# Patient Record
Sex: Male | Born: 1985 | Race: White | Hispanic: No | Marital: Married | Smoking: Current every day smoker
Health system: Southern US, Community
[De-identification: ages and names within clinical notes are randomized; demographics above are authoritative.]

## PROBLEM LIST (undated history)

## (undated) DIAGNOSIS — J45909 Unspecified asthma, uncomplicated: Secondary | ICD-10-CM

## (undated) DIAGNOSIS — Q922 Partial trisomy: Secondary | ICD-10-CM

## (undated) DIAGNOSIS — Q998 Other specified chromosome abnormalities: Secondary | ICD-10-CM

## (undated) DIAGNOSIS — H539 Unspecified visual disturbance: Secondary | ICD-10-CM

## (undated) HISTORY — PX: MANDIBLE SURGERY: SHX707

## (undated) HISTORY — PX: WISDOM TOOTH EXTRACTION: SHX21

## (undated) HISTORY — DX: Other specified chromosome abnormalities: Q99.8

## (undated) HISTORY — DX: Unspecified visual disturbance: H53.9

## (undated) HISTORY — DX: Partial trisomy: Q92.2

---
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
## 2009-03-17 ENCOUNTER — Encounter (INDEPENDENT_AMBULATORY_CARE_PROVIDER_SITE_OTHER): Payer: Self-pay | Admitting: Internal Medicine

## 2009-03-17 ENCOUNTER — Ambulatory Visit: Payer: Self-pay | Admitting: Family Medicine

## 2009-03-17 LAB — CONVERTED CEMR LAB
ALT: 27 units/L (ref 0–53)
AST: 16 units/L (ref 0–37)
Albumin: 4.2 g/dL (ref 3.5–5.2)
Alkaline Phosphatase: 71 units/L (ref 39–117)
BUN: 14 mg/dL (ref 6–23)
Basophils Absolute: 0.1 10*3/uL (ref 0.0–0.1)
Basophils Relative: 1 % (ref 0–1)
CO2: 23 meq/L (ref 19–32)
Calcium: 8.8 mg/dL (ref 8.4–10.5)
Chloride: 103 meq/L (ref 96–112)
Creatinine, Ser: 0.84 mg/dL (ref 0.40–1.50)
Eosinophils Absolute: 0.3 10*3/uL (ref 0.0–0.7)
Eosinophils Relative: 3 % (ref 0–5)
Glucose, Bld: 75 mg/dL (ref 70–99)
HCT: 47.2 % (ref 39.0–52.0)
Hemoglobin: 15.7 g/dL (ref 13.0–17.0)
Lymphocytes Relative: 29 % (ref 12–46)
Lymphs Abs: 2.9 10*3/uL (ref 0.7–4.0)
MCHC: 33.3 g/dL (ref 30.0–36.0)
MCV: 90.8 fL (ref 78.0–100.0)
Magnesium: 2 mg/dL (ref 1.5–2.5)
Monocytes Absolute: 1.1 10*3/uL — ABNORMAL HIGH (ref 0.1–1.0)
Monocytes Relative: 11 % (ref 3–12)
Neutro Abs: 5.7 10*3/uL (ref 1.7–7.7)
Neutrophils Relative %: 57 % (ref 43–77)
Platelets: 295 10*3/uL (ref 150–400)
Potassium: 4.4 meq/L (ref 3.5–5.3)
RBC: 5.2 M/uL (ref 4.22–5.81)
RDW: 14 % (ref 11.5–15.5)
Sodium: 141 meq/L (ref 135–145)
TSH: 0.797 microintl units/mL (ref 0.350–4.500)
Total Bilirubin: 0.3 mg/dL (ref 0.3–1.2)
Total Protein: 7.1 g/dL (ref 6.0–8.3)
WBC: 10.1 10*3/uL (ref 4.0–10.5)

## 2010-07-01 NOTE — Discharge Summary (Signed)
NAMEGREYDON, BETKE NO.:  [id number]   MEDICAL RECORD NO.:  [id number]          PATIENT TYPE:  OIB   LOCATION:  1613                         FACILITY:  [facility]   PHYSICIAN:  Grant Ruts., D.D.S.DATE OF BIRTH:  11-02-1985   DATE OF ADMISSION:  06/08/2005  DATE OF DISCHARGE:  06/09/2005                                 DISCHARGE SUMMARY   HISTORY OF PRESENT ILLNESS:  This 25 year old male was admitted to [facility] on June 08, 2005, with a primary diagnosis of multiple facial  skeletal abnormalities and malocclusion.  He was admitted for surgical  correction with orthognathic surgery and for a complete review of physical  findings and diagnosis via the admission note.   HOSPITAL COURSE:  On the day of admission, the patient was taken to surgery  and under a general nasotracheal anesthesia, a segmental two-piece LeFort I  maxillary osteotomy with a posterior and superior repositioning was  completed and transverse widening with rigid fixation and a bilateral  sagittal split osteotomy of the mandible with advancement in rotation for  asymmetry and Class II correction and rigid fixation.  The patient tolerated  the surgery and the anesthesia without complications.   Postop day #1, was marked with some nausea and minimal p.o. intake.  This  has improved significantly over the last 8-10 hours and the patient is now  alert, ambulating and is ready for discharge.  He also has a history of  asthma, but has not had any significant airway difficulty and is now taking  fluids well.   Home care instructions were reviewed with the patient and his mother  including emesis and airway obstruction, how to remove the maxillomandibular  fixation, dietary instructions for full liquid diet and oral hygiene  instructions to maintain good oral hygiene.   DISCHARGE MEDICATIONS:  1.  Decadron 4 mg three times daily x3 days.  2.  Keflex 500 mg four times daily x7 days.  3.  Clarinex one tablet daily x15 days.  4.  Percocet one tablet every 4-6h. as needed for pain.  5.  Continuing albuterol inhaler as needed.   DISCHARGE DIAGNOSES:  1.  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  2.  Maxillary transverse deficiency.  3.  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  4.  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  5.  Asthma.           ______________________________  Grant Ruts., D.D.S.    WB/MEDQ  D:  06/09/2005  T:  06/10/2005  Job:  [id number]

## 2010-07-01 NOTE — Op Note (Signed)
NAMESONYA, PUCCI                ACCOUNT NO.:  [id number]   MEDICAL RECORD NO.:  [id number]          PATIENT TYPE:  AMB   LOCATION:  DAY                          FACILITY:  [facility]   PHYSICIAN:  Grant Ruts., D.D.S.DATE OF BIRTH:  1985/12/20   DATE OF PROCEDURE:  06/08/2005  DATE OF DISCHARGE:                                 OPERATIVE REPORT   PREOPERATIVE DIAGNOSIS:  Mandibular posterior vertical maxillary excess with  anterior open bite malocclusion, maxillary transverse deficiency, mandibular  sagittal deficiency with class 2 malocclusion and mandibular asymmetry.   POSTOPERATIVE DIAGNOSIS:  Mandibular posterior vertical maxillary excess  with anterior open bite malocclusion, maxillary transverse deficiency,  mandibular sagittal deficiency with class 2 malocclusion and mandibular  asymmetry.   OPERATION PERFORMED:  Jerry Caras 1 segmental maxillary osteotomy with expansion  and rigid fixation and bilateral sagittal split osteotomy and mandible with  rigid fixation.   SURGEON:  Gwendlyn Deutscher, D.D.S.   ANESTHESIA:   DESCRIPTION OF PROCEDURE:  The patient was brought to the operating room and  placed in supine position on the operating table.  Prior to the surgery,  __________ surgical models on a semi-adjusted articulator were used to  simulate the maxillary osteotomy segments, the alignment of the segments and  the advancement necessary for his mandibular correction.  Surgical guides  were fabricated to use as interocclusal splints during the surgery.   PROCEDURE AND FINDINGS:  The patient was brought to the operating room and  placed in supine position on the operating table.  After satisfactory  nasotracheal anesthesia, the Foley catheter was inserted and an NG tube.   The patient was then scrubbed with Betadine scrub and Betadine paint and one  moist sponge was placed in the pharynx as a throat pack.   Using an electrocautery, an incision was made along the right ramus  of the  mandible and extended to the submucosal buccinator muscle periosteum down to  bone.  Prior to the incision, bilateral infra-alveolar and lingual nerve  blocks were achieved for hemostasis and pain control postoperatively and a  greater palatine, nasal palatine and infraorbital nerve blocks were achieved  in the maxilla.   After the incision, a subperiosteal dissection was carried out on the right  ramus of the mandible and above the area of the lingula and a medial and  lateral dissection was carried out in a subperiosteal manner.  Using a  retractor, the lingula was identified on the medial aspect of the mandible  and using the rotary osteotome and Lindeman bur a horizontal __________ cut  was made just superior to the lingula to the medial cortical plate from the  posterior border to the anterior border.  A second sagittal cut was made  with a 701 bur and a final cut was made from the inferior border superior to  connect with the sagittal cut.  The mouth prop was moved to the right side  and a similar procedure was used to complete the sagittal osteotomy on the  left side.  No attempt was made to split either side at this point.  The  area was packed and attention was carried to the maxilla and using 0.5%  Marcaine with 1:200,000 epinephrine, posterior superior alveolar nerve,  nasopalatine, greater palatine nerves were blocked and incision was made in  the maxillary vestibule with electrocautery approximately 1 cm superior to  the junction of the attached and nonattached gingiva and zygomatic buttress  to the midline.  This incision was carried through the mucosa, submucosal  layers, periosteum down to bone.  A full thickness mucoperiosteal flap was  elevated superiorly and inferiorly exposing the lateral wall of the axilla.  This was completed also on the left side and then using a rotary osteotome,  a mark was made 10 mm width along the wall of the maxilla as a guide to how   much bone to remove and a 703 bur was used to make a horizontal bone cut 1  cm superior to the apices of the mandibular teeth and a second bone cut was  made on the posterior wall of the maxilla to the pterygoid plates and a  third cut was made through the lateral and medial wall of the maxillary  sinus into the maxillary sinus.  The medial wall of the maxilla was  separated using an osteotome chisel back to the pterygoid process.  The  nasal septal chisel was used to remove the nasal septum from the horizontal  portion of the palate and similar bone cuts were made on the other side.  Pterygomaxillary chisels were used to fracture the pterygoid plates  posteriorly.  The maxilla then could be downfractured in the usual manner  for intraoral surgery.  After down fracturing the maxilla, it was  segmentalized between the midlines and on the bilateral portion of the  palate to divide the alveolar process into two segments and the posterior  segment was expanded approximately 5 mm.  This was verified by surgical  splint and the splint was tied into position using a 26 gauge stainless  steel wire.   The maxilla after being segmentalized was placed in the surgical splint  guide.  The surgical splint guide was placed in intermaxillary fixation with  the other teeth.  After placing the patient into maxillary fixation, the  condyles seated in the most superior position in the glenoid fossa and it  was rotated into superior positioning approximately 2 mm in the anterior and  3 mm to 4 mm in the posterior.  The sinuses were thoroughly irrigated with  normal saline the incisions and torn tissue in the nasopharynx was suctioned  and sutured.  The maxilla then could be repositioned superiorly 4 mm in the  posterior and 2 mm in the anterior. Bone plates were adapted to the maxilla  and piriform rim and the zygomatic buttress on both sides and there was good bone contact seen all around.  With the patient  held in this position, the  maxilla was fixated using a L bone plates on both areas.  This nicely held  the maxilla in position.  The intermaxillary fixation was removed.  Occlusion was checked and seemed to be exactly as planned surgically.   Attention was carried to the mandible.  The maxillary splint was changed to  the final splint and ligated to the maxillary teeth.  The mandibular  osteotomies were completed with mallet and chisel, taking great care to  prevent injury to the inferior alveolar nerve and to dissect the inferior  alveolar nerve out and place it in the distal segments on  both sides.  There  was no evidence of damage to the inferior alveolar nerve. With the sagittal  osteotomies completed, the mandible was repositioned in its new position and  locked in its new position and locked into position in intermaxillary  fixation and on both rami, the advancement was seen to be  correct of  approximately 4 to 5 mm on both sides with the greater advancement being on  the left side as opposed to the right side.   Attention was turned carried out towards the proximal segment and the  proximal segment of the condyle was adapted to the ramus of the mandible in  its new position and the KLS bone plating and screw system was used to place  two screws through the left ramus of the mandible and then the two screws  through the right ramus of the mandible.   The KLS screws were placed in a noncompression manner through the medial and  lateral portal of the plate with the mandible held in its new position to  stabilize the segments with rigid fixation.  The intermaxillary fixation was  removed.  The occlusion was checked.  This occlusion was seen to be good in  anterior, posterior and vertical directions in all places except for a  slight 1 mm to 1.5 mm open bite on the right side secondary to loss of one  of the brackets on tooth #30.   The intermaxillary fixation was removed.  Again  the occlusion was verified  and all incisions were thoroughly irrigated.  The maxillary incision was  closed with a VY cinch suture at the lateral nasalis muscle and the  mandibular incisions were closed with one continuous 4-0 Vicryl suture.  All  incisions were sutured primarily and the throat pack was removed.  Pharynx  was suctioned dry.  The patient was awakened and taken to the recovery area  in stable condition, tolerated the surgery and the anesthesia without  complications.   ESTIMATED BLOOD LOSS:  350 mL.   TISSUE REMOVED:  None.   COMPLICATIONS:  None.           ______________________________  Grant Ruts., D.D.S.     WB/MEDQ  D:  06/08/2005  T:  06/09/2005  Job:  [id number]

## 2011-05-27 ENCOUNTER — Encounter (HOSPITAL_BASED_OUTPATIENT_CLINIC_OR_DEPARTMENT_OTHER): Payer: Self-pay | Admitting: *Deleted

## 2011-05-27 ENCOUNTER — Emergency Department (HOSPITAL_BASED_OUTPATIENT_CLINIC_OR_DEPARTMENT_OTHER)
Admission: EM | Admit: 2011-05-27 | Discharge: 2011-05-27 | Disposition: A | Discharge status: Home / Self Care | Payer: Self-pay | Attending: Emergency Medicine | Admitting: Emergency Medicine

## 2011-05-27 DIAGNOSIS — A64 Unspecified sexually transmitted disease: Secondary | ICD-10-CM | POA: Insufficient documentation

## 2011-05-27 DIAGNOSIS — B356 Tinea cruris: Secondary | ICD-10-CM | POA: Insufficient documentation

## 2011-05-27 DIAGNOSIS — R300 Dysuria: Secondary | ICD-10-CM | POA: Insufficient documentation

## 2011-05-27 LAB — URINALYSIS, ROUTINE W REFLEX MICROSCOPIC
Bilirubin Urine: NEGATIVE
Glucose, UA: NEGATIVE mg/dL
Hgb urine dipstick: NEGATIVE
Ketones, ur: NEGATIVE mg/dL
Leukocytes, UA: NEGATIVE
Nitrite: NEGATIVE
Protein, ur: NEGATIVE mg/dL
Specific Gravity, Urine: 1.023 (ref 1.005–1.030)
Urobilinogen, UA: 0.2 mg/dL (ref 0.0–1.0)
pH: 6 (ref 5.0–8.0)

## 2011-05-27 MED ORDER — LIDOCAINE HCL (PF) 1 % IJ SOLN
INTRAMUSCULAR | Status: AC
  Administered 2011-05-27: 2.1 mL
  Filled 2011-05-27: qty 5

## 2011-05-27 MED ORDER — AZITHROMYCIN 1 G PO PACK
1.0000 g | PACK | Freq: Once | ORAL | Status: AC
  Administered 2011-05-27: 1 g via ORAL
  Filled 2011-05-27: qty 1

## 2011-05-27 MED ORDER — CEFTRIAXONE SODIUM 250 MG IJ SOLR
250.0000 mg | Freq: Once | INTRAMUSCULAR | Status: AC
  Administered 2011-05-27: 250 mg via INTRAMUSCULAR
  Filled 2011-05-27: qty 250

## 2011-05-27 NOTE — ED Notes (Signed)
Pt c/o painful urination x 6 months and a rash in his perineal region for 3 weeks. +itching at times.

## 2011-05-27 NOTE — ED Provider Notes (Signed)
History     CSN: [id number]  Arrival date & time 05/27/11  1541   First MD Initiated Contact with Patient 05/27/11 1606      Chief Complaint  Patient presents with  . Dysuria    (Consider location/radiation/quality/duration/timing/severity/associated sxs/prior treatment) HPI Comments: Pt states that he was recently told that he had chlamydia from a [place] facility, but was not treated:pt states that he has also noted a rash in  His pubic hair region in the last  Couple of weeks  Patient is a 26 y.o. male presenting with dysuria. The history is provided by the patient. No language interpreter was used.  Dysuria  This is a new problem. The current episode started more than 1 week ago. The problem occurs every urination. The problem has not changed since onset.The quality of the pain is described as burning. The pain is mild. There has been no fever. Pertinent negatives include no urgency and no flank pain.    Past Medical History  Diagnosis Date  . Asthma     History reviewed. No pertinent past surgical history.  History reviewed. No pertinent family history.  History  Substance Use Topics  . Smoking status: Current Everyday Smoker  . Smokeless tobacco: Not on file  . Alcohol Use: No      Review of Systems  Constitutional: Negative.   Respiratory: Negative.   Cardiovascular: Negative.   Gastrointestinal: Negative.   Genitourinary: Positive for dysuria. Negative for urgency and flank pain.    Allergies  Review of patient's allergies indicates no known allergies.  Home Medications   Current Outpatient Rx  Name Route Sig Dispense Refill  . VITAMIN D 1000 UNITS PO TABS Oral Take 1,000 Units by mouth daily.    Marland Kitchen FISH OIL PO Oral Take 1 capsule by mouth daily.      BP 117/78  Pulse 80  Temp(Src) 98.2 F (36.8 C) (Oral)  Resp 18  Ht 5\' 8"  (1.727 m)  Wt 155 lb (70.308 kg)  BMI 23.57 kg/m2  SpO2 98%  Physical Exam  Nursing note and vitals  reviewed. Constitutional: He is oriented to person, place, and time. He appears well-developed and well-nourished.  Cardiovascular: Normal rate and regular rhythm.   Pulmonary/Chest: Effort normal and breath sounds normal.  Abdominal: Soft. Bowel sounds are normal. There is no tenderness.  Genitourinary: Penis normal.       No penile discharge noted:pt has rash consistent with jock itch  Musculoskeletal: Normal range of motion.  Neurological: He is alert and oriented to person, place, and time.  Skin: Skin is warm and dry.    ED Course  Procedures (including critical care time)   Labs Reviewed  URINALYSIS, ROUTINE W REFLEX MICROSCOPIC   No results found.   1. STD (male)   2. Jock itch       MDM  Pt treat for std here:dsicussed treatment for jock itch        Teressa Lower, NP 05/27/11 1739

## 2011-05-27 NOTE — ED Provider Notes (Signed)
Medical screening examination/treatment/procedure(s) were performed by non-physician practitioner and as supervising physician I was immediately available for consultation/collaboration.    Verlee Pope L Dhanvin Szeto, MD 05/27/11 2252 

## 2011-05-27 NOTE — Discharge Instructions (Signed)
Jock Itch Jock itch is a germ infection of the groin and upper thighs. The type of germ that causes jock itch is a fungus. It is itchy and often feels like it is burning. It is common in people who play sports. Sweating and wearing certain athletic gear can cause this type of rash. HOME CARE  Take your medicines as told. Finish them even if you start to feel better.   Wear loose-fitting clothing.   Men should wear cotton boxer shorts.   Women should wear cotton underwear.   Avoid hot baths.   Dry the groin area well after bathing.  GET HELP RIGHT AWAY IF:   Your rash is worse or spreading.   Your rash returns after treatment is finished.   Your rash is not gone in 4 weeks.   The area becomes red, warm, tender, and puffy (swollen).   You have a fever.  MAKE SURE YOU:  Understand these instructions.   Will watch your condition.   Will get help right away if you are not doing well or get worse.  Document Released: 04/26/2009 Document Revised: 01/19/2011 Document Reviewed: 04/26/2009 [employer] Patient Information 2012 [employer]. 

## 2011-08-26 ENCOUNTER — Encounter (HOSPITAL_BASED_OUTPATIENT_CLINIC_OR_DEPARTMENT_OTHER): Payer: Self-pay | Admitting: *Deleted

## 2011-08-26 ENCOUNTER — Emergency Department (HOSPITAL_BASED_OUTPATIENT_CLINIC_OR_DEPARTMENT_OTHER)
Admission: EM | Admit: 2011-08-26 | Discharge: 2011-08-26 | Disposition: A | Discharge status: Home / Self Care | Payer: Self-pay | Attending: Emergency Medicine | Admitting: Emergency Medicine

## 2011-08-26 DIAGNOSIS — F17200 Nicotine dependence, unspecified, uncomplicated: Secondary | ICD-10-CM | POA: Insufficient documentation

## 2011-08-26 DIAGNOSIS — H109 Unspecified conjunctivitis: Secondary | ICD-10-CM | POA: Insufficient documentation

## 2011-08-26 MED ORDER — TOBRAMYCIN 0.3 % OP SOLN: 1.0000 [drp] | OPHTHALMIC | Status: AC

## 2011-08-26 NOTE — ED Notes (Signed)
Pt reports itching, swelling and drainage to right eye.  Pt has young son at home with no sx to date.

## 2011-08-26 NOTE — ED Notes (Signed)
Pt presents to ED today with pain and drainage from right eye for the last 2 days.  Pt reports no other sx.

## 2011-08-26 NOTE — ED Provider Notes (Signed)
Medical screening examination/treatment/procedure(s) were performed by non-physician practitioner and as supervising physician I was immediately available for consultation/collaboration.   Rolan Bucco, MD 08/26/11 [phone]

## 2011-08-26 NOTE — ED Provider Notes (Signed)
History     CSN: [id number]  Arrival date & time 08/26/11  1132   First MD Initiated Contact with Patient 08/26/11 1207      Chief Complaint  Patient presents with  . Conjunctivitis    (Consider location/radiation/quality/duration/timing/severity/associated sxs/prior treatment) Patient is a 26 y.o. male presenting with conjunctivitis. The history is provided by the patient. No language interpreter was used.  Conjunctivitis  The current episode started today. The problem occurs continuously. The problem has been gradually worsening. The problem is moderate. Nothing relieves the symptoms. Nothing aggravates the symptoms. Associated symptoms include eye itching, URI, eye discharge, eye pain and eye redness. The eye pain is moderate. He has been eating and drinking normally. There were no sick contacts.    Past Medical History  Diagnosis Date  . Asthma     History reviewed. No pertinent past surgical history.  History reviewed. No pertinent family history.  History  Substance Use Topics  . Smoking status: Current Everyday Smoker  . Smokeless tobacco: Not on file  . Alcohol Use: No      Review of Systems  Eyes: Positive for pain, discharge, redness and itching.  All other systems reviewed and are negative.    Allergies  Review of patient's allergies indicates no known allergies.  Home Medications   Current Outpatient Rx  Name Route Sig Dispense Refill  . VITAMIN D 1000 UNITS PO TABS Oral Take 1,000 Units by mouth daily.    Marland Kitchen FISH OIL PO Oral Take 1 capsule by mouth daily.      BP 114/69  Pulse 75  Temp 98.2 F (36.8 C) (Oral)  Resp 12  Ht 5\' 8"  (1.727 m)  Wt 155 lb (70.308 kg)  BMI 23.57 kg/m2  SpO2 99%  Physical Exam  Nursing note and vitals reviewed. Constitutional: He appears well-developed and well-nourished.  HENT:  Head: Normocephalic.  Mouth/Throat: Oropharynx is clear and moist.  Eyes: EOM are normal. Pupils are equal, round, and reactive to  light. Right eye exhibits discharge.       Erythematous injected right eye,   Neck: Normal range of motion. Neck supple.  Pulmonary/Chest: Effort normal.  Abdominal: Soft.  Musculoskeletal: Normal range of motion.  Neurological: He is alert.  Skin: Skin is warm.    ED Course  Procedures (including critical care time)  Labs Reviewed - No data to display No results found.   No diagnosis found.    MDM  Rx for tobrex,  I advised zyrtec,   Recheck in 2 days if not improved (Dr. Randon Goldsmith )        Lonia Skinner [place], [place] 08/26/11 1219

## 2011-11-28 ENCOUNTER — Emergency Department (HOSPITAL_BASED_OUTPATIENT_CLINIC_OR_DEPARTMENT_OTHER)
Admission: EM | Admit: 2011-11-28 | Discharge: 2011-11-28 | Disposition: A | Discharge status: Home / Self Care | Payer: Self-pay | Attending: Emergency Medicine | Admitting: Emergency Medicine

## 2011-11-28 ENCOUNTER — Encounter (HOSPITAL_BASED_OUTPATIENT_CLINIC_OR_DEPARTMENT_OTHER): Payer: Self-pay | Admitting: *Deleted

## 2011-11-28 DIAGNOSIS — J45909 Unspecified asthma, uncomplicated: Secondary | ICD-10-CM

## 2011-11-28 DIAGNOSIS — R059 Cough, unspecified: Secondary | ICD-10-CM | POA: Insufficient documentation

## 2011-11-28 DIAGNOSIS — R0602 Shortness of breath: Secondary | ICD-10-CM | POA: Insufficient documentation

## 2011-11-28 DIAGNOSIS — F17200 Nicotine dependence, unspecified, uncomplicated: Secondary | ICD-10-CM | POA: Insufficient documentation

## 2011-11-28 DIAGNOSIS — J45901 Unspecified asthma with (acute) exacerbation: Secondary | ICD-10-CM | POA: Insufficient documentation

## 2011-11-28 DIAGNOSIS — R05 Cough: Secondary | ICD-10-CM | POA: Insufficient documentation

## 2011-11-28 MED ORDER — DEXAMETHASONE SODIUM PHOSPHATE 10 MG/ML IJ SOLN
10.0000 mg | Freq: Once | INTRAMUSCULAR | Status: AC
  Administered 2011-11-28: 10 mg via INTRAMUSCULAR
  Filled 2011-11-28: qty 1

## 2011-11-28 MED ORDER — ALBUTEROL SULFATE HFA 108 (90 BASE) MCG/ACT IN AERS
2.0000 | INHALATION_SPRAY | Freq: Once | RESPIRATORY_TRACT | Status: AC
  Administered 2011-11-28: 2 via RESPIRATORY_TRACT
  Filled 2011-11-28: qty 6.7

## 2011-11-28 MED ORDER — IPRATROPIUM BROMIDE 0.02 % IN SOLN
RESPIRATORY_TRACT | Status: AC
  Administered 2011-11-28: 0.5 mg
  Filled 2011-11-28: qty 2.5

## 2011-11-28 MED ORDER — ALBUTEROL SULFATE (5 MG/ML) 0.5% IN NEBU
INHALATION_SOLUTION | RESPIRATORY_TRACT | Status: AC
  Administered 2011-11-28: 5 mg
  Filled 2011-11-28: qty 1

## 2011-11-28 NOTE — ED Notes (Signed)
Sob x 3 days. Hx of asthma. Ran out of his inhaler 2 days ago. Coughing.

## 2011-11-28 NOTE — ED Provider Notes (Signed)
History     CSN: [id number]  Arrival date & time 11/28/11  2039   First MD Initiated Contact with Patient 11/28/11 2113      Chief Complaint  Patient presents with  . Shortness of Breath     HPI Sob x 3 days. Hx of asthma. Ran out of his inhaler 2 days ago. Coughing.  Patient denies fever.  No productive cough.  Past Medical History  Diagnosis Date  . Asthma     History reviewed. No pertinent past surgical history.  No family history on file.  History  Substance Use Topics  . Smoking status: Current Every Day Smoker  . Smokeless tobacco: Not on file  . Alcohol Use: No      Review of Systems  All other systems reviewed and are negative.    Allergies  Review of patient's allergies indicates no known allergies.  Home Medications   Current Outpatient Rx  Name Route Sig Dispense Refill  . ALBUTEROL IN Inhalation Inhale into the lungs.    Marland Kitchen CLARITIN PO Oral Take by mouth.    Marland Kitchen VITAMIN D 1000 UNITS PO TABS Oral Take 1,000 Units by mouth daily.    Marland Kitchen FISH OIL PO Oral Take 1 capsule by mouth daily.      BP 127/79  Pulse 94  Temp 98 F (36.7 C) (Oral)  Resp 22  SpO2 97%  Physical Exam  Nursing note and vitals reviewed. Constitutional: He is oriented to person, place, and time. He appears well-developed and well-nourished. No distress.  HENT:  Head: Normocephalic and atraumatic.  Eyes: Pupils are equal, round, and reactive to light.  Neck: Normal range of motion.  Cardiovascular: Normal rate and intact distal pulses.   Pulmonary/Chest: No respiratory distress. He has wheezes.  Abdominal: Normal appearance. He exhibits no distension.  Musculoskeletal: Normal range of motion.  Neurological: He is alert and oriented to person, place, and time. No cranial nerve deficit.  Skin: Skin is warm and dry. No rash noted.  Psychiatric: He has a normal mood and affect. His behavior is normal.    ED Course  Procedures (including critical care time)  Medications    Loratadine (CLARITIN PO) (not administered)  ALBUTEROL IN (not administered)  albuterol (PROVENTIL) (5 MG/ML) 0.5% nebulizer solution (5 mg  Given 11/28/11 2102)  ipratropium (ATROVENT) 0.02 % nebulizer solution (0.5 mg  Given 11/28/11 2102)  dexamethasone (DECADRON) injection 10 mg (10 mg Intramuscular Given 11/28/11 2124)  albuterol (PROVENTIL HFA;VENTOLIN HFA) 108 (90 BASE) MCG/ACT inhaler 2 puff (2 puff Inhalation Given 11/28/11 2123)    Labs Reviewed - No data to display No results found.   1. Asthma       MDM  After treatment in the ED the patient feels back to baseline and wants to go home.        Nelia Shi, MD 11/28/11 2222

## 2012-01-08 ENCOUNTER — Encounter (HOSPITAL_BASED_OUTPATIENT_CLINIC_OR_DEPARTMENT_OTHER): Payer: Self-pay | Admitting: Family Medicine

## 2012-01-08 ENCOUNTER — Emergency Department (HOSPITAL_BASED_OUTPATIENT_CLINIC_OR_DEPARTMENT_OTHER)
Admission: EM | Admit: 2012-01-08 | Discharge: 2012-01-08 | Disposition: A | Discharge status: Home / Self Care | Payer: Self-pay | Attending: Emergency Medicine | Admitting: Emergency Medicine

## 2012-01-08 DIAGNOSIS — Z79899 Other long term (current) drug therapy: Secondary | ICD-10-CM | POA: Insufficient documentation

## 2012-01-08 DIAGNOSIS — R110 Nausea: Secondary | ICD-10-CM | POA: Insufficient documentation

## 2012-01-08 DIAGNOSIS — M545 Low back pain, unspecified: Secondary | ICD-10-CM | POA: Insufficient documentation

## 2012-01-08 DIAGNOSIS — S39012A Strain of muscle, fascia and tendon of lower back, initial encounter: Secondary | ICD-10-CM

## 2012-01-08 DIAGNOSIS — F17200 Nicotine dependence, unspecified, uncomplicated: Secondary | ICD-10-CM | POA: Insufficient documentation

## 2012-01-08 DIAGNOSIS — J45909 Unspecified asthma, uncomplicated: Secondary | ICD-10-CM | POA: Insufficient documentation

## 2012-01-08 LAB — URINALYSIS, ROUTINE W REFLEX MICROSCOPIC
Glucose, UA: NEGATIVE mg/dL
Hgb urine dipstick: NEGATIVE
Ketones, ur: 15 mg/dL — AB
Protein, ur: NEGATIVE mg/dL

## 2012-01-08 MED ORDER — CYCLOBENZAPRINE HCL 10 MG PO TABS
10.0000 mg | ORAL_TABLET | Freq: Two times a day (BID) | ORAL | Status: DC | PRN
Start: 2012-01-08 — End: 2012-04-08

## 2012-01-08 MED ORDER — OXYCODONE-ACETAMINOPHEN 5-325 MG PO TABS
1.0000 | ORAL_TABLET | Freq: Once | ORAL | Status: DC
  Filled 2012-01-08: qty 1

## 2012-01-08 MED ORDER — IBUPROFEN 600 MG PO TABS: 600.0000 mg | ORAL_TABLET | Freq: Four times a day (QID) | ORAL | Status: DC | PRN

## 2012-01-08 NOTE — ED Provider Notes (Signed)
History     CSN: [id number]  Arrival date & time 01/08/12  8469   First MD Initiated Contact with Patient 01/08/12 [phone]      Chief Complaint  Patient presents with  . Back Pain    (Consider location/radiation/quality/duration/timing/severity/associated sxs/prior treatment) HPI Comments: Patient complains of constant throbbing pain to his low back for the last 2 days. He has not taken anything other than ibuprofen at home which has not been relieving the symptoms. Denies any radiation down his legs. Denies abdominal pain. He had some nausea earlier associated with the pain. He states the pain is worse with movement. Denies any known injury to his back. Denies any numbness or weakness in his extremities. Denies any loss of bladder or bowel control. Denies any history of back problems in the past. He denies any urinary symptoms.  Patient is a 26 y.o. male presenting with back pain.  Back Pain  Pertinent negatives include no chest pain, no fever, no numbness, no headaches, no abdominal pain and no weakness.    Past Medical History  Diagnosis Date  . Asthma     History reviewed. No pertinent past surgical history.  No family history on file.  History  Substance Use Topics  . Smoking status: Current Every Day Smoker  . Smokeless tobacco: Not on file  . Alcohol Use: No      Review of Systems  Constitutional: Negative for fever, chills, diaphoresis and fatigue.  HENT: Negative for congestion, rhinorrhea and sneezing.   Eyes: Negative.   Respiratory: Negative for cough, chest tightness and shortness of breath.   Cardiovascular: Negative for chest pain and leg swelling.  Gastrointestinal: Positive for nausea. Negative for vomiting, abdominal pain, diarrhea and blood in stool.  Genitourinary: Negative for frequency, hematuria, flank pain and difficulty urinating.  Musculoskeletal: Positive for back pain. Negative for arthralgias.  Skin: Negative for rash.  Neurological:  Negative for dizziness, speech difficulty, weakness, numbness and headaches.    Allergies  Review of patient's allergies indicates no known allergies.  Home Medications   Current Outpatient Rx  Name  Route  Sig  Dispense  Refill  . ALBUTEROL IN   Inhalation   Inhale into the lungs.         Marland Kitchen VITAMIN D 1000 UNITS PO TABS   Oral   Take 1,000 Units by mouth daily.         . CYCLOBENZAPRINE HCL 10 MG PO TABS   Oral   Take 1 tablet (10 mg total) by mouth 2 (two) times daily as needed for muscle spasms.   20 tablet   0   . IBUPROFEN 600 MG PO TABS   Oral   Take 1 tablet (600 mg total) by mouth every 6 (six) hours as needed for pain.   30 tablet   0   . CLARITIN PO   Oral   Take by mouth.         Marland Kitchen FISH OIL PO   Oral   Take 1 capsule by mouth daily.           BP 130/87  Pulse 101  Temp 98.6 F (37 C) (Oral)  Resp 20  Ht 5\' 8"  (1.727 m)  Wt 155 lb (70.308 kg)  BMI 23.57 kg/m2  SpO2 100%  Physical Exam  Constitutional: He is oriented to person, place, and time. He appears well-developed and well-nourished.  HENT:  Head: Normocephalic and atraumatic.  Eyes: Pupils are equal, round, and reactive to light.  Neck: Normal range of motion. Neck supple.  Cardiovascular: Normal rate, regular rhythm and normal heart sounds.   Pulmonary/Chest: Effort normal and breath sounds normal. No respiratory distress. He has no wheezes. He has no rales. He exhibits no tenderness.  Abdominal: Soft. Bowel sounds are normal. There is no tenderness. There is no rebound and no guarding.  Musculoskeletal: Normal range of motion. He exhibits no edema.       Pain along the lumbar musuculature bilaterally.  No pain over spine  Lymphadenopathy:    He has no cervical adenopathy.  Neurological: He is alert and oriented to person, place, and time. He has normal strength. No sensory deficit. GCS eye subscore is 4. GCS verbal subscore is 5. GCS motor subscore is 6.       SLR negative    Skin: Skin is warm and dry. No rash noted.  Psychiatric: He has a normal mood and affect.    ED Course  Procedures (including critical care time)  Labs Reviewed  URINALYSIS, ROUTINE W REFLEX MICROSCOPIC - Abnormal; Notable for the following:    Ketones, ur 15 (*)     All other components within normal limits   No results found.   1. Back strain       MDM  Pt with no spinal tenderness, no neuro deficitis.  Likely muscle strain.  Nothing to suggest cauda equina.  No evidence of UTI/renal colic.  Will tx with ibuprofen/flexeril.  Advised to f/u with PMD or return if symptoms not improving        Rolan Bucco, MD 01/08/12 1009

## 2012-01-08 NOTE — ED Notes (Signed)
Pt c/o low back pain since waking up Saturday. Denies injury. Denies urinary or bowel dysfunction. Pt ambulatory.

## 2012-01-22 ENCOUNTER — Encounter (HOSPITAL_COMMUNITY): Payer: Self-pay | Admitting: Emergency Medicine

## 2012-01-22 ENCOUNTER — Emergency Department (HOSPITAL_COMMUNITY)
Admission: EM | Admit: 2012-01-22 | Discharge: 2012-01-22 | Disposition: A | Discharge status: Home / Self Care | Payer: Self-pay | Attending: Emergency Medicine | Admitting: Emergency Medicine

## 2012-01-22 ENCOUNTER — Emergency Department (HOSPITAL_COMMUNITY): Payer: Self-pay

## 2012-01-22 DIAGNOSIS — R05 Cough: Secondary | ICD-10-CM | POA: Insufficient documentation

## 2012-01-22 DIAGNOSIS — F17200 Nicotine dependence, unspecified, uncomplicated: Secondary | ICD-10-CM | POA: Insufficient documentation

## 2012-01-22 DIAGNOSIS — Z79899 Other long term (current) drug therapy: Secondary | ICD-10-CM | POA: Insufficient documentation

## 2012-01-22 DIAGNOSIS — J45901 Unspecified asthma with (acute) exacerbation: Secondary | ICD-10-CM | POA: Insufficient documentation

## 2012-01-22 DIAGNOSIS — R0602 Shortness of breath: Secondary | ICD-10-CM | POA: Insufficient documentation

## 2012-01-22 DIAGNOSIS — R059 Cough, unspecified: Secondary | ICD-10-CM | POA: Insufficient documentation

## 2012-01-22 IMAGING — CR DG CHEST 2V
2 series · 2 of 2 positions shown · non-contrast
Comparison: None.

CLINICAL DATA: Asthma

CHEST - 2 VIEW

[w chest pa]
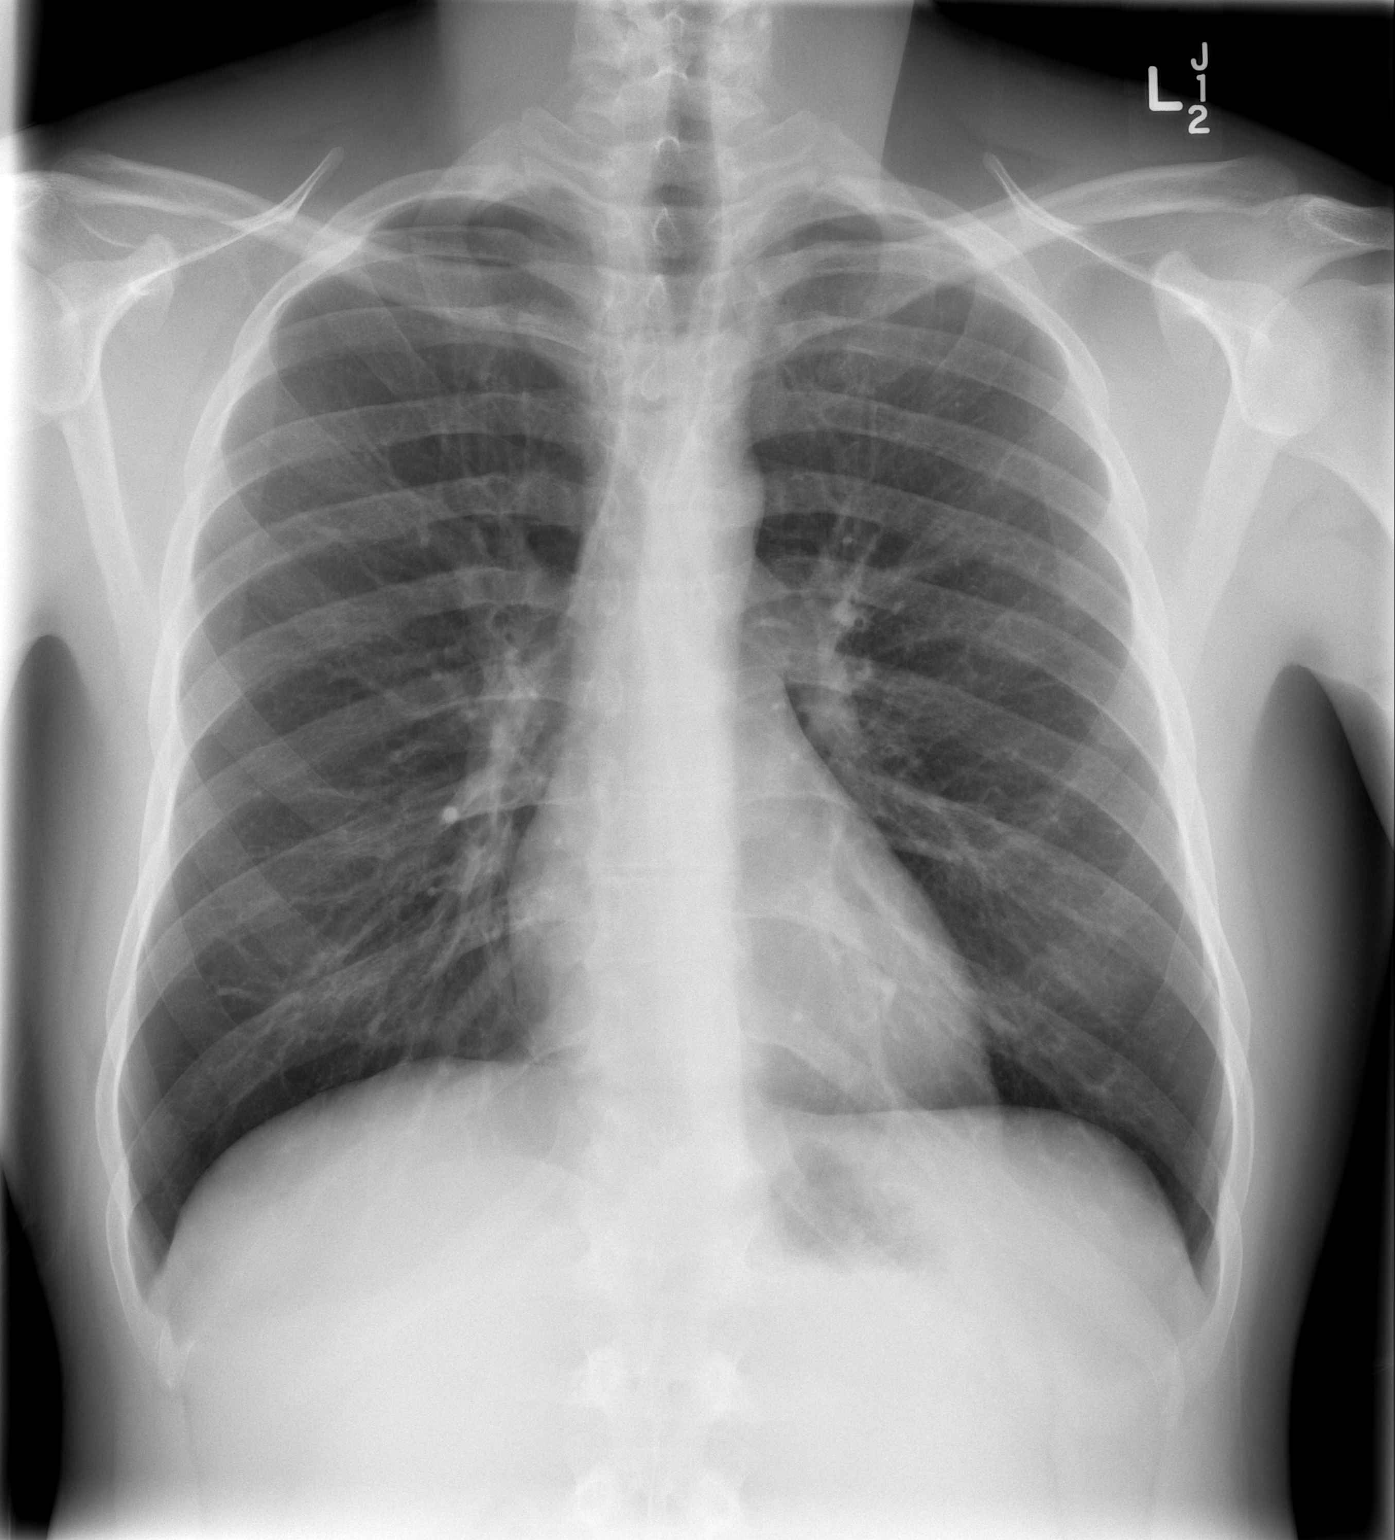

[w chest lat]
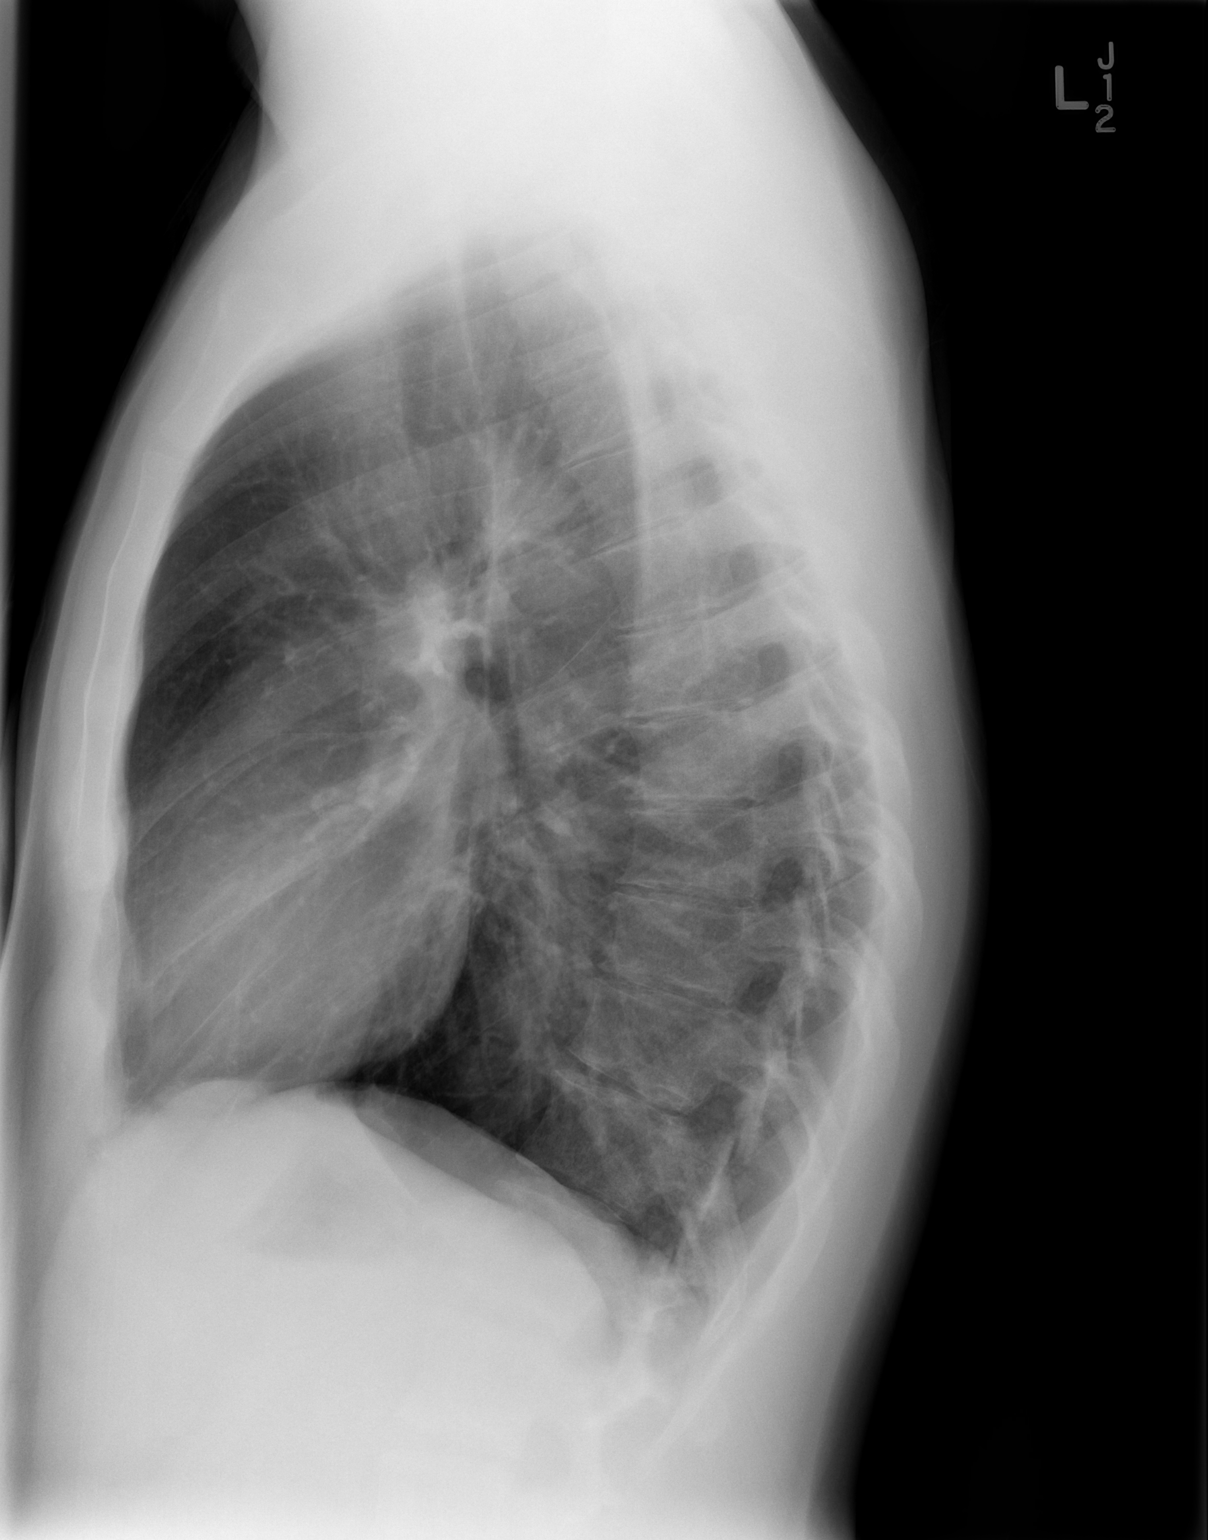

[2 of 2 positions shown; findings below may reference images not displayed]

FINDINGS: Lungs clear.  Heart size and pulmonary vascularity are
normal.  No adenopathy.  There is no appreciable bone lesions.
IMPRESSION: No edema or consolidation.

## 2012-01-22 MED ORDER — PREDNISONE 20 MG PO TABS
60.0000 mg | ORAL_TABLET | Freq: Once | ORAL | Status: AC
  Administered 2012-01-22: 60 mg via ORAL
  Filled 2012-01-22: qty 3

## 2012-01-22 MED ORDER — IPRATROPIUM BROMIDE 0.02 % IN SOLN
0.5000 mg | Freq: Once | RESPIRATORY_TRACT | Status: AC
  Administered 2012-01-22: 0.5 mg via RESPIRATORY_TRACT
  Filled 2012-01-22: qty 2.5

## 2012-01-22 MED ORDER — PREDNISONE 10 MG PO TABS: 20.0000 mg | ORAL_TABLET | Freq: Every day | ORAL | Status: DC

## 2012-01-22 MED ORDER — ALBUTEROL SULFATE HFA 108 (90 BASE) MCG/ACT IN AERS
2.0000 | INHALATION_SPRAY | RESPIRATORY_TRACT | Status: DC | PRN
  Administered 2012-01-22: 2 via RESPIRATORY_TRACT
  Filled 2012-01-22: qty 6.7

## 2012-01-22 MED ORDER — ALBUTEROL SULFATE (5 MG/ML) 0.5% IN NEBU
5.0000 mg | INHALATION_SOLUTION | Freq: Once | RESPIRATORY_TRACT | Status: AC
  Administered 2012-01-22: 5 mg via RESPIRATORY_TRACT
  Filled 2012-01-22: qty 1

## 2012-01-22 NOTE — ED Notes (Signed)
Pt c/o increased SOB and wheezing x 1 day; pt sts out of asthma meds today; pt wheezing at present

## 2012-01-22 NOTE — ED Notes (Signed)
Pt states he is feeling much better after treatment. Pt states he does not have an inhaler at home. Pt denies any complaints at this time.

## 2012-01-22 NOTE — ED Provider Notes (Signed)
History     CSN: [id number]  Arrival date & time 01/22/12  1436   First MD Initiated Contact with Patient 01/22/12 1724      Chief Complaint  Patient presents with  . Asthma    (Consider location/radiation/quality/duration/timing/severity/associated sxs/prior treatment) HPI  26 year old male with history of asthma presents with chief complaints of increased respiratory wheezing. Patient reports for the past one day has gradual onset of increased wheezing and SOB after a prolonged bout of cough.  Sts he sometimes would have trouble with wheezing if he has a prolonged cough.  Today while at work he coughs again and subsequently having increase SOB and wheezing.  Onset gradual, wax and wane, moderate in severity, worsening after coughing, improves after receiving breathing treatment here in ED.  Pt is out of his rescue inhaler x 1 month.  Sts he only uses as needed.  No prior hx of ICU stay or intubation.  No new pets, environmental changes, or other triggers identified.  Denies fever, chills, headache, cp, abd pain, productive cough, hemoptysis, or rash.    Past Medical History  Diagnosis Date  . Asthma     History reviewed. No pertinent past surgical history.  History reviewed. No pertinent family history.  History  Substance Use Topics  . Smoking status: Current Every Day Smoker  . Smokeless tobacco: Not on file  . Alcohol Use: No      Review of Systems  Constitutional: Negative for fever.  Respiratory: Positive for cough and shortness of breath.   Cardiovascular: Negative for chest pain.  Gastrointestinal: Negative for vomiting.  Skin: Negative for rash.  Neurological: Negative for headaches.    Allergies  Review of patient's allergies indicates no known allergies.  Home Medications   Current Outpatient Rx  Name  Route  Sig  Dispense  Refill  . ALBUTEROL SULFATE HFA 108 (90 BASE) MCG/ACT IN AERS   Inhalation   Inhale 2 puffs into the lungs every 4 (four) hours  as needed. For asthma/shortness of breath         . VITAMIN D 1000 UNITS PO TABS   Oral   Take 1,000 Units by mouth daily.         . CYCLOBENZAPRINE HCL 10 MG PO TABS   Oral   Take 1 tablet (10 mg total) by mouth 2 (two) times daily as needed for muscle spasms.   20 tablet   0   . IBUPROFEN 200 MG PO TABS   Oral   Take 400 mg by mouth every 6 (six) hours as needed. For headache         . LORATADINE 10 MG PO TABS   Oral   Take 10 mg by mouth daily.           BP 118/53  Pulse 98  Temp 97.4 F (36.3 C) (Oral)  Resp 18  SpO2 99%  Physical Exam  Nursing note and vitals reviewed. Constitutional: He appears well-developed and well-nourished. No distress.       Awake, alert, nontoxic appearance  HENT:  Head: Atraumatic.  Eyes: Conjunctivae normal are normal. Right eye exhibits no discharge. Left eye exhibits no discharge.  Neck: Normal range of motion. Neck supple.  Cardiovascular: Normal rate and regular rhythm.   Pulmonary/Chest: Effort normal. No respiratory distress. He has no wheezes. He exhibits no tenderness.  Abdominal: Soft. There is no tenderness. There is no rebound.  Musculoskeletal: He exhibits no edema and no tenderness.  ROM appears intact, no obvious focal weakness  Neurological: He is alert.  Skin: Skin is warm and dry. No rash noted.  Psychiatric: He has a normal mood and affect.    ED Course  Procedures (including critical care time)  Labs Reviewed - No data to display Dg Chest 2 View  01/22/2012  *RADIOLOGY REPORT*  Clinical Data: Asthma  CHEST - 2 VIEW  Comparison: None.  Findings: Lungs clear.  Heart size and pulmonary vascularity are normal.  No adenopathy.  There is no appreciable bone lesions.  IMPRESSION: No edema or consolidation.   Original Report Authenticated By: Bretta Bang, M.D.      No diagnosis found.  1. Asthma exacerbation  MDM  Pt with asthma exacerbation after cough.  sxs improves after receiving nebs  treatment.  Currently in NAD, VSS.  CXR unremarkable.  Will d/c with prednisone and rescue inhaler, which we will give here in ER.    BP 118/53  Pulse 98  Temp 97.4 F (36.3 C) (Oral)  Resp 18  SpO2 99%  I have reviewed nursing notes and vital signs. I personally reviewed the imaging tests through PACS system  I reviewed available ER/hospitalization records thought the EMR         Fayrene Helper, [place] 01/22/12 1748

## 2012-01-23 NOTE — ED Provider Notes (Signed)
Medical screening examination/treatment/procedure(s) were performed by non-physician practitioner and as supervising physician I was immediately available for consultation/collaboration.  Flint Melter, MD 01/23/12 0005

## 2012-04-08 ENCOUNTER — Emergency Department (HOSPITAL_COMMUNITY)
Admission: EM | Admit: 2012-04-08 | Discharge: 2012-04-08 | Disposition: A | Discharge status: Home / Self Care | Payer: Medicaid Other | Attending: Emergency Medicine | Admitting: Emergency Medicine

## 2012-04-08 ENCOUNTER — Emergency Department (HOSPITAL_COMMUNITY): Payer: Medicaid Other

## 2012-04-08 ENCOUNTER — Encounter (HOSPITAL_COMMUNITY): Payer: Self-pay

## 2012-04-08 DIAGNOSIS — R05 Cough: Secondary | ICD-10-CM | POA: Insufficient documentation

## 2012-04-08 DIAGNOSIS — R059 Cough, unspecified: Secondary | ICD-10-CM | POA: Insufficient documentation

## 2012-04-08 DIAGNOSIS — F17200 Nicotine dependence, unspecified, uncomplicated: Secondary | ICD-10-CM | POA: Insufficient documentation

## 2012-04-08 DIAGNOSIS — R0602 Shortness of breath: Secondary | ICD-10-CM | POA: Insufficient documentation

## 2012-04-08 DIAGNOSIS — J45901 Unspecified asthma with (acute) exacerbation: Secondary | ICD-10-CM | POA: Insufficient documentation

## 2012-04-08 DIAGNOSIS — Z79899 Other long term (current) drug therapy: Secondary | ICD-10-CM | POA: Insufficient documentation

## 2012-04-08 DIAGNOSIS — R000 Tachycardia, unspecified: Secondary | ICD-10-CM | POA: Insufficient documentation

## 2012-04-08 DIAGNOSIS — J3489 Other specified disorders of nose and nasal sinuses: Secondary | ICD-10-CM | POA: Insufficient documentation

## 2012-04-08 IMAGING — CR DG CHEST 2V
2 series · 2 of 2 positions shown · non-contrast
Comparison: [DATE]

CLINICAL DATA: Shortness of breath, wheezing

CHEST - 2 VIEW

[w chest pa]
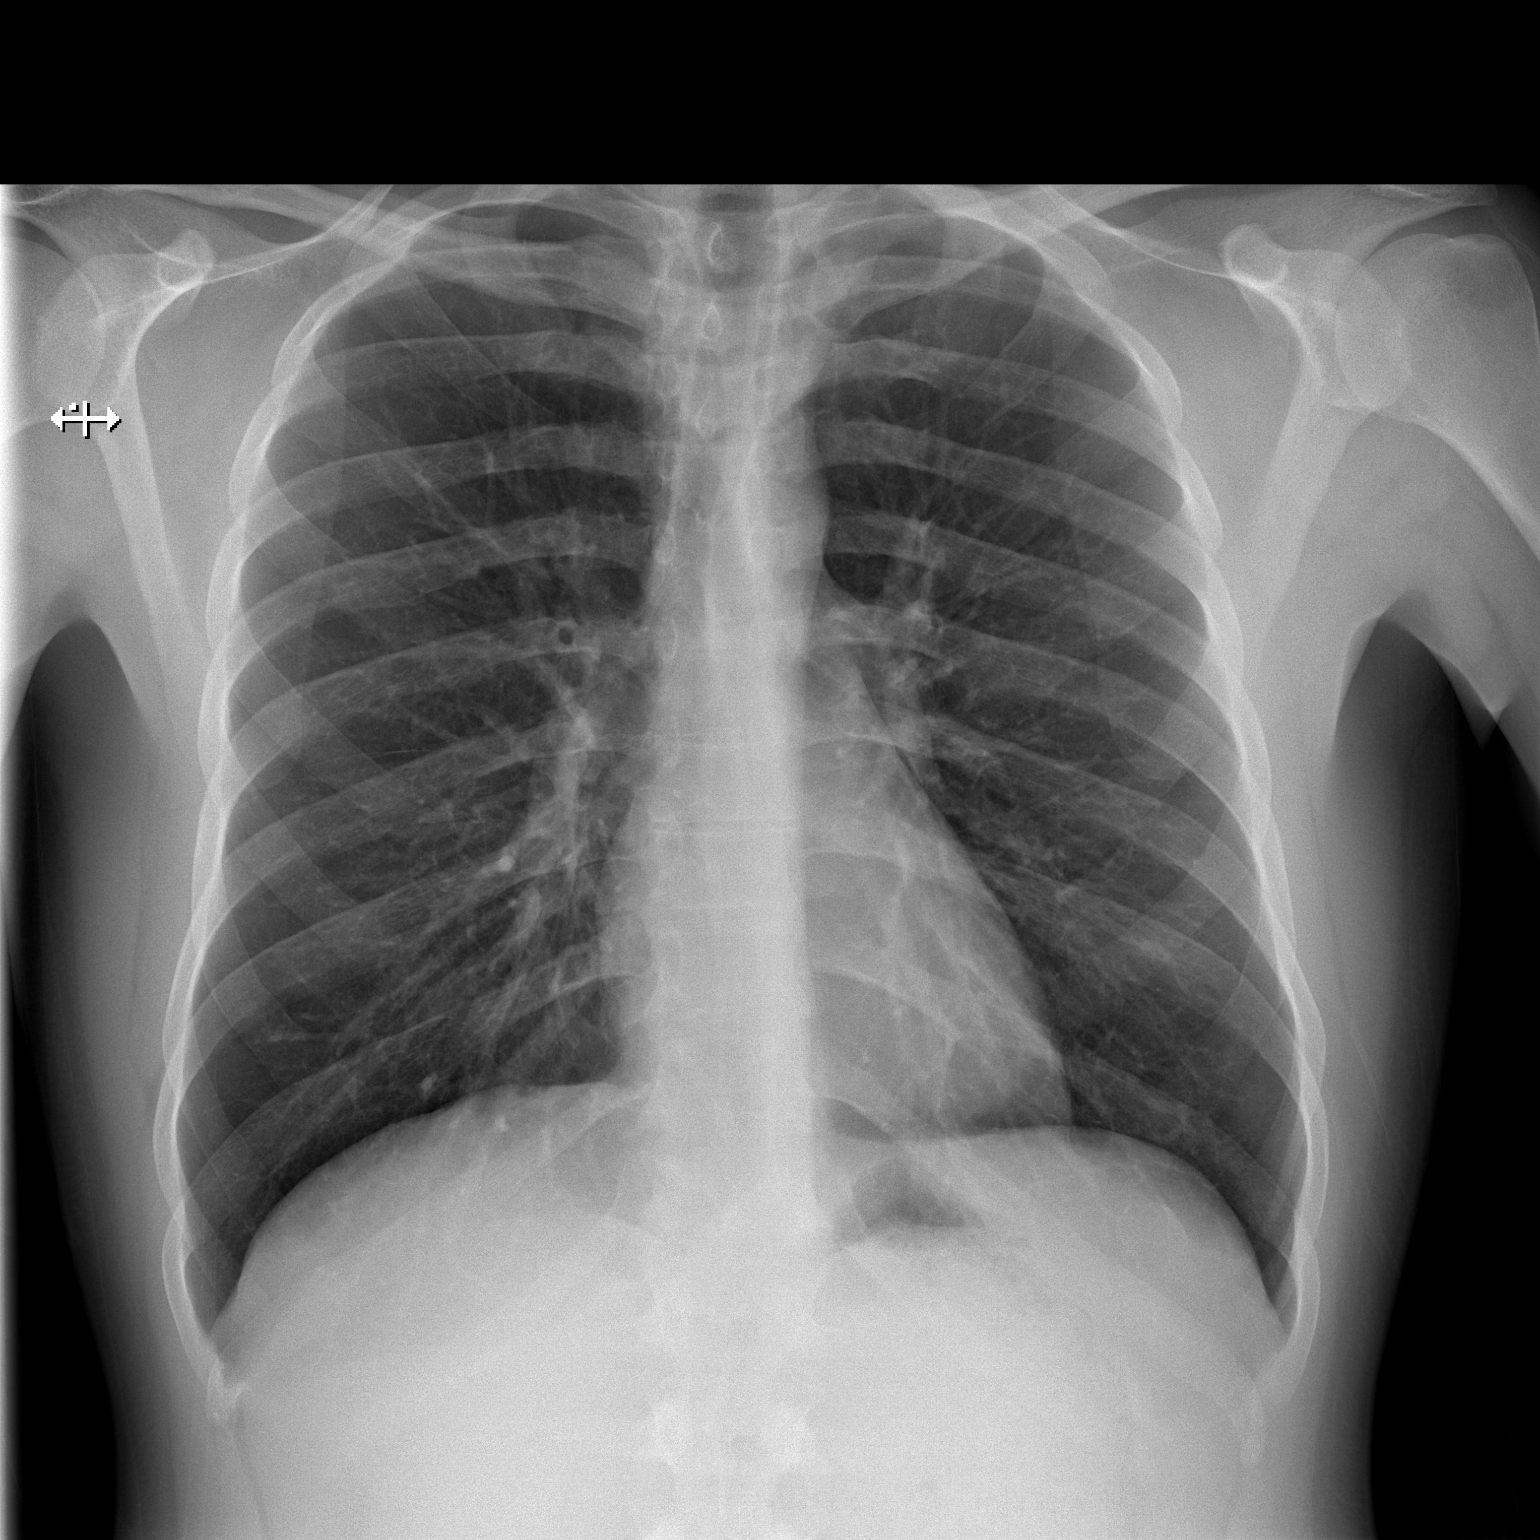

[w chest lat]
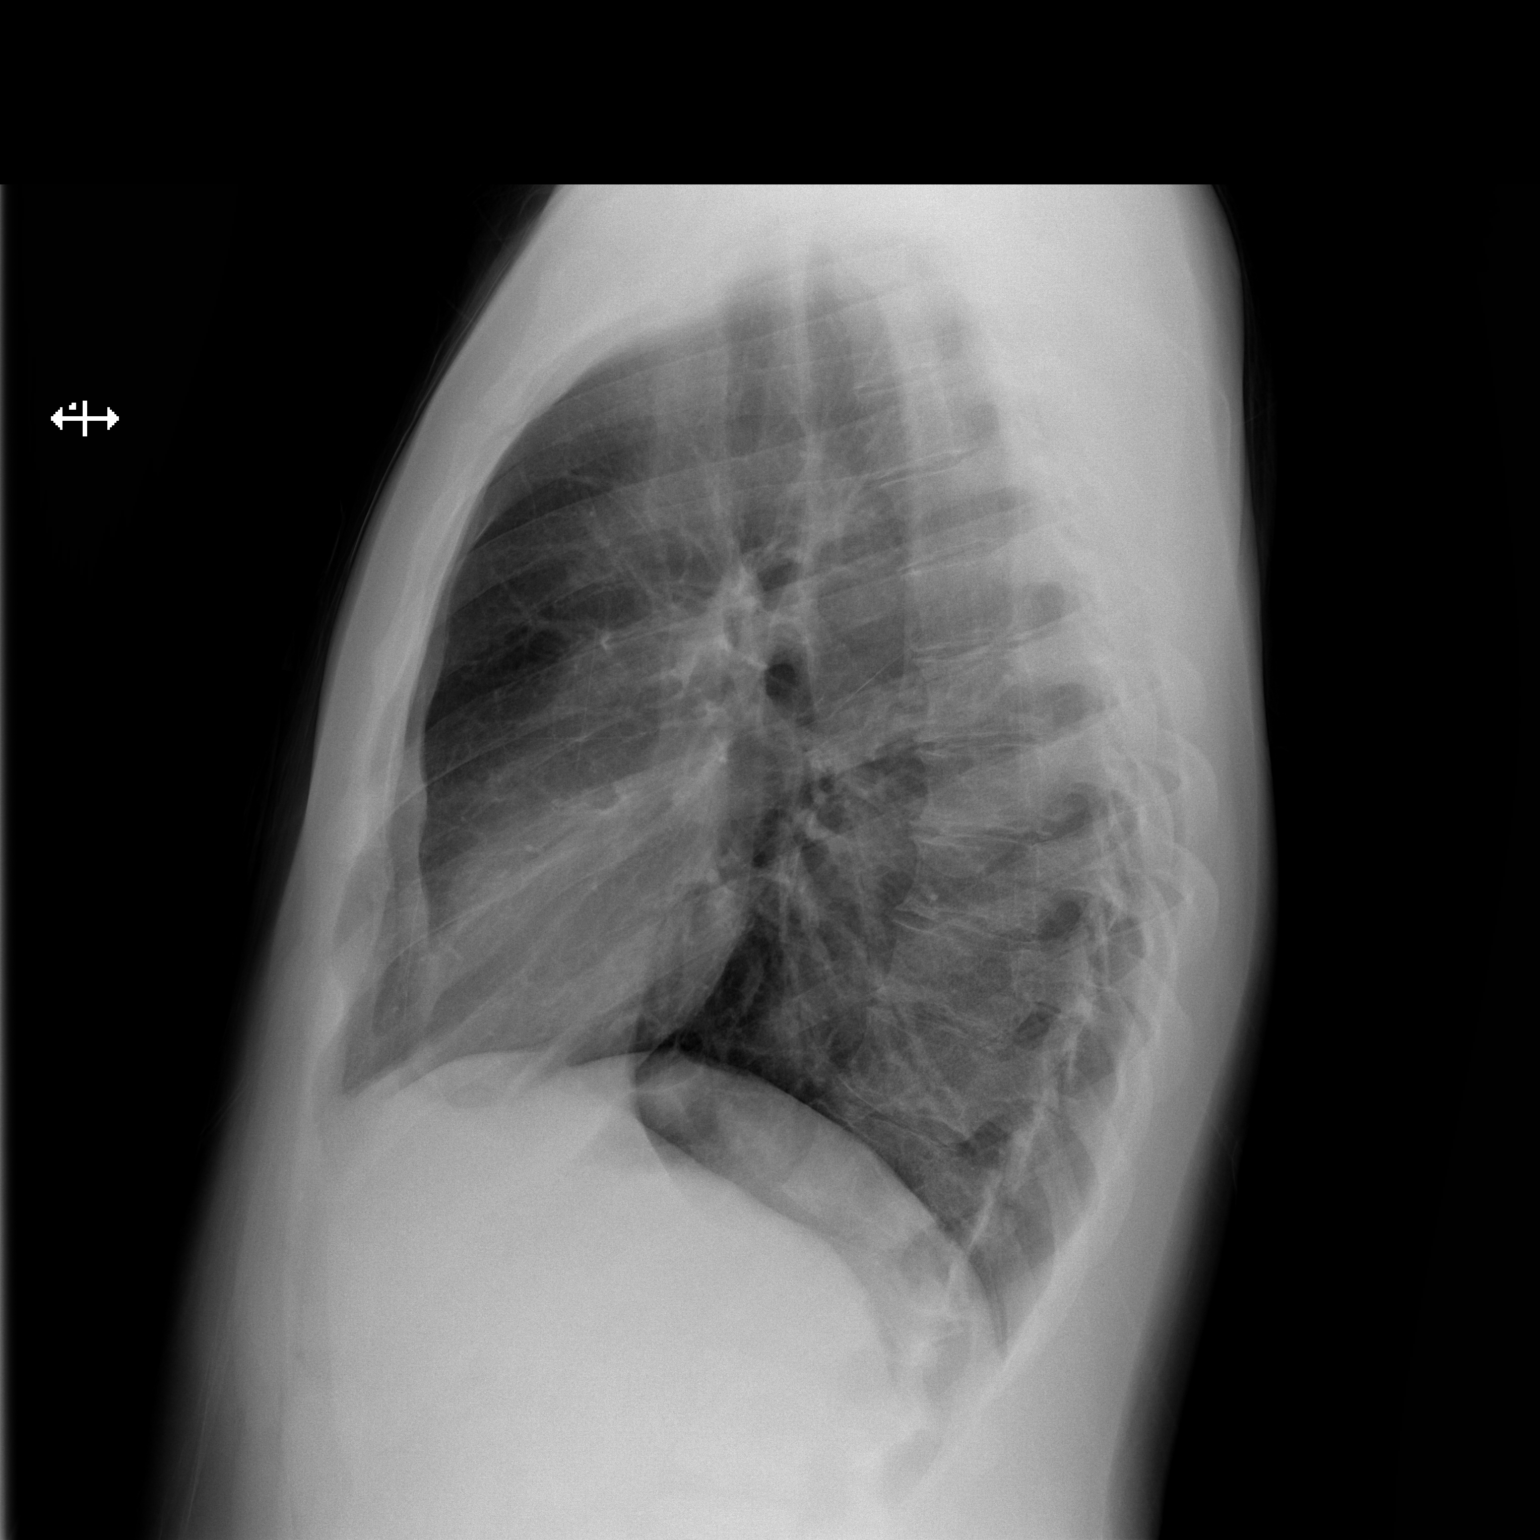

[2 of 2 positions shown; findings below may reference images not displayed]

FINDINGS: Lungs are clear. No pleural effusion or pneumothorax.

Cardiomediastinal silhouette is within normal limits.

Visualized osseous structures are within normal limits.
IMPRESSION: No evidence of acute cardiopulmonary disease.

## 2012-04-08 MED ORDER — PREDNISONE 20 MG PO TABS: ORAL_TABLET | ORAL | Status: DC

## 2012-04-08 MED ORDER — ALBUTEROL SULFATE (5 MG/ML) 0.5% IN NEBU
5.0000 mg | INHALATION_SOLUTION | Freq: Once | RESPIRATORY_TRACT | Status: AC
  Administered 2012-04-08: 5 mg via RESPIRATORY_TRACT
  Filled 2012-04-08: qty 1

## 2012-04-08 MED ORDER — PREDNISONE 20 MG PO TABS
60.0000 mg | ORAL_TABLET | Freq: Once | ORAL | Status: AC
  Administered 2012-04-08: 60 mg via ORAL
  Filled 2012-04-08: qty 2
  Filled 2012-04-08: qty 1

## 2012-04-08 MED ORDER — ALBUTEROL SULFATE (2.5 MG/3ML) 0.083% IN NEBU: 2.5000 mg | INHALATION_SOLUTION | RESPIRATORY_TRACT | Status: DC | PRN

## 2012-04-08 MED ORDER — IPRATROPIUM BROMIDE 0.02 % IN SOLN
0.5000 mg | Freq: Once | RESPIRATORY_TRACT | Status: AC
  Administered 2012-04-08: 0.5 mg via RESPIRATORY_TRACT
  Filled 2012-04-08: qty 2.5

## 2012-04-08 MED ORDER — ALBUTEROL SULFATE HFA 108 (90 BASE) MCG/ACT IN AERS: 2.0000 | INHALATION_SPRAY | RESPIRATORY_TRACT | Status: DC | PRN

## 2012-04-08 MED ORDER — ALBUTEROL SULFATE (5 MG/ML) 0.5% IN NEBU: 2.5000 mg | INHALATION_SOLUTION | Freq: Once | RESPIRATORY_TRACT | Status: DC

## 2012-04-08 NOTE — ED Notes (Signed)
Patient transported to X-ray 

## 2012-04-08 NOTE — Progress Notes (Signed)
During [facility] ED 04/08/12 visit pt was seen by [facility] liaison  Pt offered services to assist with finding a [place] self pay provider, resources & health reform information

## 2012-04-08 NOTE — ED Notes (Signed)
Paatient reports that he began having wheezing and SOB last night, but ws out of medication for his mebulizer and used tap water which did not help.

## 2012-04-08 NOTE — ED Provider Notes (Signed)
History     CSN: [id number]  Arrival date & time 04/08/12  1032   First MD Initiated Contact with Patient 04/08/12 1055      Chief Complaint  Patient presents with  . Shortness of Breath  . Wheezing    (Consider location/radiation/quality/duration/timing/severity/associated sxs/prior treatment) HPI 27 year old male complains of a couple days of cough wheezing shortness of breath with minimal nasal congestion but no fever no headache no confusion no stiff neck no chest pain no abdominal pain no vomiting no diarrhea no rash no body aches, he ran out of his inhaler and his albuterol nebulizer, last steroids in December 2013, there is no treatment prior to arrival, he is in mild to moderate respiratory distress. Past Medical History  Diagnosis Date  . Asthma     Past Surgical History  Procedure Laterality Date  . Mandible surgery      No family history on file.  History  Substance Use Topics  . Smoking status: Current Every Day Smoker -- 0.50 packs/day    Types: Cigarettes  . Smokeless tobacco: Never Used  . Alcohol Use: No      Review of Systems 10 Systems reviewed and are negative for acute change except as noted in the HPI. Allergies  Review of patient's allergies indicates no known allergies.  Home Medications   Current Outpatient Rx  Name  Route  Sig  Dispense  Refill  . albuterol (PROVENTIL HFA;VENTOLIN HFA) 108 (90 BASE) MCG/ACT inhaler   Inhalation   Inhale 2 puffs into the lungs every 4 (four) hours as needed. For asthma/shortness of breath         . ibuprofen (ADVIL,MOTRIN) 200 MG tablet   Oral   Take 400 mg by mouth every 6 (six) hours as needed. For headache         . loratadine (CLARITIN) 10 MG tablet   Oral   Take 10 mg by mouth daily.         Marland Kitchen albuterol (PROVENTIL HFA;VENTOLIN HFA) 108 (90 BASE) MCG/ACT inhaler   Inhalation   Inhale 2 puffs into the lungs every 2 (two) hours as needed for wheezing or shortness of breath (cough).   1  Inhaler   0   . albuterol (PROVENTIL) (2.5 MG/3ML) 0.083% nebulizer solution   Nebulization   Take 3 mLs (2.5 mg total) by nebulization every 4 (four) hours as needed for wheezing.   30 vial   0   . predniSONE (DELTASONE) 20 MG tablet      2 tabs po daily x 4 days   8 tablet   0     BP 111/81  Pulse 105  Temp(Src) 98.5 F (36.9 C) (Oral)  Resp 20  SpO2 96%  Physical Exam  Nursing note and vitals reviewed. Constitutional:  Awake, alert, nontoxic appearance.  HENT:  Head: Atraumatic.  Mouth/Throat: Oropharynx is clear and moist. No oropharyngeal exudate.  Eyes: Right eye exhibits no discharge. Left eye exhibits no discharge.  Neck: Neck supple.  Cardiovascular: Regular rhythm.   No murmur heard. Tachycardic  Pulmonary/Chest: He is in respiratory distress. He has wheezes. He has no rales. He exhibits no tenderness.  Mild to moderate respiratory distress, the patient speaks phrases in short sentences with mild respiratory distress, his room air pulse oximetry is normal at 94%, he has no retractions no accessory muscle usage but he does have tachypnea and increased work of breathing even at rest  Abdominal: Soft. There is no tenderness. There is no rebound.  Musculoskeletal: He exhibits no edema and no tenderness.  Baseline ROM, no obvious new focal weakness.  Neurological: He is alert.  Mental status and motor strength appears baseline for patient and situation.  Skin: No rash noted.  Psychiatric: He has a normal mood and affect.    ED Course  Procedures (including critical care time)  Labs Reviewed - No data to display No results found.   1. Acute asthma exacerbation       MDM  Pt stable in ED with no significant deterioration in condition.  Patient / Family / Caregiver informed of clinical course, understand medical decision-making process, and agree with plan.  I doubt any other EMC precluding discharge at this time including, but not necessarily limited  to the following:sepsis.        Hurman Horn, MD 04/15/12 [phone]

## 2012-05-23 ENCOUNTER — Emergency Department (HOSPITAL_BASED_OUTPATIENT_CLINIC_OR_DEPARTMENT_OTHER)
Admission: EM | Admit: 2012-05-23 | Discharge: 2012-05-23 | Disposition: A | Discharge status: Home / Self Care | Payer: Medicaid Other | Attending: Emergency Medicine | Admitting: Emergency Medicine

## 2012-05-23 ENCOUNTER — Encounter (HOSPITAL_BASED_OUTPATIENT_CLINIC_OR_DEPARTMENT_OTHER): Payer: Self-pay | Admitting: *Deleted

## 2012-05-23 DIAGNOSIS — B029 Zoster without complications: Secondary | ICD-10-CM | POA: Insufficient documentation

## 2012-05-23 DIAGNOSIS — F17200 Nicotine dependence, unspecified, uncomplicated: Secondary | ICD-10-CM | POA: Insufficient documentation

## 2012-05-23 DIAGNOSIS — S81009A Unspecified open wound, unspecified knee, initial encounter: Secondary | ICD-10-CM | POA: Insufficient documentation

## 2012-05-23 DIAGNOSIS — Y9389 Activity, other specified: Secondary | ICD-10-CM | POA: Insufficient documentation

## 2012-05-23 DIAGNOSIS — Y9289 Other specified places as the place of occurrence of the external cause: Secondary | ICD-10-CM | POA: Insufficient documentation

## 2012-05-23 DIAGNOSIS — S91332A Puncture wound without foreign body, left foot, initial encounter: Secondary | ICD-10-CM

## 2012-05-23 DIAGNOSIS — S91009A Unspecified open wound, unspecified ankle, initial encounter: Secondary | ICD-10-CM | POA: Insufficient documentation

## 2012-05-23 DIAGNOSIS — J45909 Unspecified asthma, uncomplicated: Secondary | ICD-10-CM | POA: Insufficient documentation

## 2012-05-23 DIAGNOSIS — W268XXA Contact with other sharp object(s), not elsewhere classified, initial encounter: Secondary | ICD-10-CM | POA: Insufficient documentation

## 2012-05-23 DIAGNOSIS — Z79899 Other long term (current) drug therapy: Secondary | ICD-10-CM | POA: Insufficient documentation

## 2012-05-23 DIAGNOSIS — Z23 Encounter for immunization: Secondary | ICD-10-CM | POA: Insufficient documentation

## 2012-05-23 MED ORDER — TETANUS-DIPHTH-ACELL PERTUSSIS 5-2.5-18.5 LF-MCG/0.5 IM SUSP
0.5000 mL | Freq: Once | INTRAMUSCULAR | Status: AC
  Administered 2012-05-23: 0.5 mL via INTRAMUSCULAR
  Filled 2012-05-23: qty 0.5

## 2012-05-23 MED ORDER — CIPROFLOXACIN HCL 250 MG PO TABS: 500.0000 mg | ORAL_TABLET | Freq: Two times a day (BID) | ORAL | Status: DC

## 2012-05-23 MED ORDER — ACYCLOVIR 800 MG PO TABS: 800.0000 mg | ORAL_TABLET | Freq: Every day | ORAL | Status: DC

## 2012-05-23 NOTE — ED Notes (Signed)
Rash with blisters on the back of his neck. Started after removing insulation from a building. Wants to have an STD check while he is here. Denies penile discharge or any known exposure to an STD.

## 2012-05-24 NOTE — ED Provider Notes (Signed)
Medical screening examination/treatment/procedure(s) were performed by non-physician practitioner and as supervising physician I was immediately available for consultation/collaboration.   Lizzie An B. Naketa Daddario, MD 05/24/12 1104 

## 2012-05-24 NOTE — ED Provider Notes (Signed)
History     CSN: [id number]  Arrival date & time 05/23/12  2019   First MD Initiated Contact with Patient 05/23/12 2149      Chief Complaint  Patient presents with  . Rash    (Consider location/radiation/quality/duration/timing/severity/associated sxs/prior treatment) HPI Comments: Patient is a 27 y/o M presenting to the ED with rash on the posterior aspect of his neck, localized to the left side, that started approximately 2 days ago. Stated that the rash is not pruritic, but is more of a mild burning sensation and feels tight, a pulling sensation, whenever he moves his neck around. Denied any spreading of the rash or rash being located anywhere else except for the posterior aspect of his neck. Reported that he has been placing A&D ointment on rash with minimal help. Stated that the sun irritates the rash. Patient stated that he was working with insulation 2 days ago as well, but cannot recall if any insulation landed on his neck - denied any instances of burning. Patient stated that he just got over a stomach virus. Denied fever, chills, headaches, shortness of breathe, difficulty breathing, chest pain, dysphagia, ear pain, eye pain, visual distortions, abdominal pain, nausea, vomiting, diarrhea, urinary symptoms, numbness and paresthesias to extremities.  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 Mild pain with walking was reported. Denied inflammation, swelling, drainage, abscess formation, erythema to extremity.  Patient is a 27 y.o. male presenting with rash. The history is provided by the patient. No language interpreter was used.  Rash Associated symptoms: no abdominal pain, no diarrhea, no fatigue, no fever, no headaches, no nausea, no shortness of breath, no sore throat and not vomiting     Past Medical History  Diagnosis Date  . Asthma      Past Surgical History  Procedure Laterality Date  . Mandible surgery      No family history on file.  History  Substance Use Topics  . Smoking status: Current Every Day Smoker -- 0.50 packs/day    Types: Cigarettes  . Smokeless tobacco: Never Used  . Alcohol Use: No      Review of Systems  Constitutional: Negative for fever, chills and fatigue.  HENT: Negative for ear pain, congestion, sore throat, trouble swallowing, neck pain and neck stiffness.   Eyes: Negative for photophobia, pain and visual disturbance.  Respiratory: Negative for chest tightness and shortness of breath.   Cardiovascular: Negative for chest pain and leg swelling.  Gastrointestinal: Negative for nausea, vomiting, abdominal pain, diarrhea and constipation.  Genitourinary: Negative for hematuria, decreased urine volume, difficulty urinating and penile pain.  Musculoskeletal: Negative for back pain.  Skin: Positive for rash and wound.  Neurological: Negative for dizziness, light-headedness, numbness and headaches.  All other systems reviewed and are negative.    Allergies  Review of patient's allergies indicates no known allergies.  Home Medications   Current Outpatient Rx  Name  Route  Sig  Dispense  Refill  . acyclovir (ZOVIRAX) 800 MG tablet   Oral   Take 1 tablet (800 mg total) by mouth 5 (five) times daily.   50 tablet   0   . albuterol (PROVENTIL HFA;VENTOLIN HFA) 108 (90 BASE) MCG/ACT inhaler   Inhalation   Inhale 2 puffs into the lungs every 4 (four) hours as needed. For asthma/shortness of breath         .  albuterol (PROVENTIL HFA;VENTOLIN HFA) 108 (90 BASE) MCG/ACT inhaler   Inhalation   Inhale 2 puffs into the lungs every 2 (two) hours as needed for wheezing or shortness of breath (cough).   1 Inhaler   0   . albuterol (PROVENTIL) (2.5 MG/3ML) 0.083% nebulizer solution   Nebulization   Take 3 mLs (2.5 mg total) by nebulization every 4 (four) hours as needed for wheezing.    30 vial   0   . ciprofloxacin (CIPRO) 250 MG tablet   Oral   Take 2 tablets (500 mg total) by mouth every 12 (twelve) hours.   28 tablet   0   . ibuprofen (ADVIL,MOTRIN) 200 MG tablet   Oral   Take 400 mg by mouth every 6 (six) hours as needed. For headache         . loratadine (CLARITIN) 10 MG tablet   Oral   Take 10 mg by mouth daily.         . predniSONE (DELTASONE) 20 MG tablet      2 tabs po daily x 4 days   8 tablet   0     BP 122/80  Pulse 75  Temp(Src) 98.5 F (36.9 C) (Oral)  Resp 20  Wt 155 lb (70.308 kg)  BMI 23.57 kg/m2  SpO2 96%  Physical Exam  Nursing note and vitals reviewed. Constitutional: He is oriented to person, place, and time. He appears well-developed and well-nourished. No distress.  HENT:  Head: Normocephalic and atraumatic.  Mouth/Throat: Oropharynx is clear and moist. No oropharyngeal exudate.  Eyes: Conjunctivae and EOM are normal. Pupils are equal, round, and reactive to light. Right eye exhibits no discharge. Left eye exhibits no discharge.  Neck: Normal range of motion. Neck supple. No tracheal deviation present.  Negative lymphadenopathy  Cardiovascular: Normal rate, regular rhythm, normal heart sounds and intact distal pulses.  Exam reveals no friction rub.   No murmur heard. Negative leg edema Peripheral pulses palpable  Pulmonary/Chest: Effort normal and breath sounds normal. No respiratory distress. He has no wheezes. He has no rales.  Abdominal: Soft. Bowel sounds are normal. He exhibits no distension. There is no tenderness. There is no rebound and no guarding.  Musculoskeletal: Normal range of motion. He exhibits tenderness. He exhibits no edema.  Pinpoint puncture wound located on plantar surface, near arch of left foot. No swelling, inflammation, erythema, abscess, drainage, streaks noted to left foot. Full ROM to left foot, digits, ankle and leg. Mild pain upon palpation to puncture site. Skin has healed over. Pedal pulse  palpable - sensation intact.  Lymphadenopathy:    He has no cervical adenopathy.  Neurological: He is alert and oriented to person, place, and time. He has normal reflexes. No cranial nerve deficit. He exhibits normal muscle tone. Coordination normal.  Skin: Skin is warm and dry. Rash noted. He is not diaphoretic. There is erythema.  Patch of yellow-color vesicles located on left, posterior aspect of neck, with erythematous base. No blanching noted. Mild pain upon palpation and pressure to site. No swelling, inflammation, drainage. Rash not found anywhere else on body.   Psychiatric: He has a normal mood and affect. His behavior is normal. Thought content normal.    ED Course  Procedures (including critical care time)  Labs Reviewed - No data to display No results found.   1. Shingles   2. Puncture wound of left foot, initial encounter       MDM  Patient afebrile, normotensive, non-tachycardic, alert  and oriented. Rash described as erythematous based with yellow-colored vesicles located on left posterior aspect of neck - rash not found anywhere else on body. Mild pain upon palpation, but feels pulling sensation when move neck. No sign of acute infection to left foot puncture wound - skin has already healed over, no neurovascular damage noted. Discussed case with Dr. Bernette Mayers - Dr. Bernette Mayers saw patient and diagnosed him with shingles. Educated patient on what shingles is and reported that symptoms may get worse and to monitor symptoms - discussed that shingles are contagious. Patient is aseptic, non-toxic appearing. Discharged patient with Acyclovir for shingles and Ciprofloxacin for puncture wound through work boot to cover for Pseudomona infection. Discussed with patient on how to take medications Recommended patient follow-up with Urgent care Center for re-evaluation. Discussed with patient to stay hydrated and rest. For possible fever, instructed patient to take Tylenol or Ibuprofen.  Instructed patient to monitor rash and wound. Discussed that if symptoms change or worsen to report back to the ED. Gave patient list of physicians to find PCP. Patient agreed to plan of care, understood, all questions answered.         Raymon Mutton, PA-C 05/24/12 1016

## 2012-06-04 ENCOUNTER — Encounter: Payer: Self-pay | Admitting: Pulmonary Disease

## 2012-06-04 ENCOUNTER — Ambulatory Visit (INDEPENDENT_AMBULATORY_CARE_PROVIDER_SITE_OTHER): Payer: Medicaid Other | Admitting: Pulmonary Disease

## 2012-06-04 VITALS — BP 110/78 | HR 63 | Temp 98.4°F | Ht 68.0 in | Wt 171.0 lb

## 2012-06-04 DIAGNOSIS — H6120 Impacted cerumen, unspecified ear: Secondary | ICD-10-CM

## 2012-06-04 DIAGNOSIS — H6123 Impacted cerumen, bilateral: Secondary | ICD-10-CM

## 2012-06-04 DIAGNOSIS — J45909 Unspecified asthma, uncomplicated: Secondary | ICD-10-CM

## 2012-06-04 DIAGNOSIS — Z720 Tobacco use: Secondary | ICD-10-CM

## 2012-06-04 DIAGNOSIS — F17200 Nicotine dependence, unspecified, uncomplicated: Secondary | ICD-10-CM

## 2012-06-04 MED ORDER — CARBAMIDE PEROXIDE 6.5 % OT SOLN: 5.0000 [drp] | OTIC | Status: DC | PRN

## 2012-06-04 MED ORDER — ALBUTEROL SULFATE HFA 108 (90 BASE) MCG/ACT IN AERS: 2.0000 | INHALATION_SPRAY | RESPIRATORY_TRACT | Status: DC | PRN

## 2012-06-04 MED ORDER — ALBUTEROL SULFATE (2.5 MG/3ML) 0.083% IN NEBU: 2.5000 mg | INHALATION_SOLUTION | RESPIRATORY_TRACT | Status: DC | PRN

## 2012-06-04 MED ORDER — BECLOMETHASONE DIPROPIONATE 80 MCG/ACT IN AERS: 1.0000 | INHALATION_SPRAY | Freq: Two times a day (BID) | RESPIRATORY_TRACT | Status: DC

## 2012-06-04 NOTE — Assessment & Plan Note (Signed)
Explained how smoking is contributing to his difficulties with asthma control.  He will try nicotine patch.  Discussed with him dosing schedule for this, and how to working on getting off cigarettes while using nicotine replacement.

## 2012-06-04 NOTE — Assessment & Plan Note (Signed)
Advised him to try using OTC debrox.

## 2012-06-04 NOTE — Assessment & Plan Note (Signed)
He has extensive history of allergies and asthma.  He has frequent daytime and nocturnal symptoms and frequent use of albuterol.  Will start him on Qvar 80 mcg bid.  Demonstrated use of inhaler, and instructed him to rinse mouth after using.  Have refilled his albuterol inhaler and nebulizer medication.  Will arrange for PFT's to further assess.  Depending on his status he may need further allergy assessment.

## 2012-06-04 NOTE — Patient Instructions (Signed)
Qvar two puffs twice per day, and rinse mouth after each use Albuterol inhaler two puffs up to four times per day as needed for cough, wheeze, or chest congestion Albuterol in nebulizer machine one vial up to four times per day as needed for cough, wheeze, or chest congestion Debrox 5 drops in each ear as needed to soften wax >> you can get this over the counter Nicotine patch >> start with 14 mg per day and decrease as tolerated Will schedule PFT (breathing test) Follow up in 8 weeks

## 2012-06-04 NOTE — Progress Notes (Signed)
Chief Complaint  Patient presents with  . Advice Only    Self ref-- pt reports ws dx w asthma at 27yo-- aggravated by activity, becomes more SOB and has some wheezing -- currently using albuterol neb for sx relief    History of Present Illness: Nicholas Brooks is a 27 y.o. male smoker for evaluation of asthma.  He is self referral.  He does not have a primary physician.  He is accompanied by his sister who is also my patient.  He was diagnosed with asthma as a child.  He was told he has multiple allergies (cats, dogs, pollen).  He was on allergy shots before, but not recently.  He has been using claritin for his allergies.  He notices problems with his allergies year round.  He denies any food or medication allergies.  Specifically he denies aspirin sensitivity.  He has been getting short of breath with activities.  This has made it difficult for him to keep up with his home chores and child care.  He does cough occasionally and occasionally wheezes.  He denies sputum, chest congestion, sinus congestion, or post-nasal drip.  He has a rash on his ankles, and also gets this on his elbows and knees.  He was told he has psoriasis and uses a steroid cream as needed.  He wakes up several times per week feeling like he can't breath.  He uses his albuterol at least once per day if not more often.  He smokes 1 pack per day.  He denies history of pneumonia.  He has pet dog.  He is not working at present.  He was seen in ER in February 2014 for asthma exacerbation.  Chest xray then was normal.  He has not had breathing test recently. Tests:   Nicholas Brooks  has a past medical history of Asthma and Psoriasis.  Nicholas Brooks  has past surgical history that includes Mandible surgery and Wisdom tooth extraction.  Prior to Admission medications   Medication Sig Start Date End Date Taking? Authorizing Provider  acyclovir (ZOVIRAX) 800 MG tablet Take 1 tablet (800 mg total) by mouth 5 (five) times daily.  05/23/12  Yes Marissa Sciacca, PA-C  albuterol (PROVENTIL HFA;VENTOLIN HFA) 108 (90 BASE) MCG/ACT inhaler Inhale 2 puffs into the lungs every 4 (four) hours as needed. For asthma/shortness of breath   Yes Historical Provider, MD  albuterol (PROVENTIL) (2.5 MG/3ML) 0.083% nebulizer solution Take 3 mLs (2.5 mg total) by nebulization every 4 (four) hours as needed for wheezing. 04/08/12  Yes Hurman Horn, MD  ibuprofen (ADVIL,MOTRIN) 200 MG tablet Take 400 mg by mouth every 6 (six) hours as needed. For headache   Yes Historical Provider, MD  loratadine (CLARITIN) 10 MG tablet Take 10 mg by mouth daily.   Yes Historical Provider, MD    Allergies  Allergen Reactions  . Latex     His family history includes Asthma in his sister and Cancer in his maternal grandmother.  He  reports that he has been smoking Cigarettes.  He has a 4 pack-year smoking history. He has never used smokeless tobacco. He reports that he does not drink alcohol or use illicit drugs.  Review of Systems  Constitutional: Negative for fever and unexpected weight change.  HENT: Negative for ear pain, nosebleeds, congestion, sore throat, rhinorrhea, sneezing, trouble swallowing, dental problem, postnasal drip and sinus pressure.   Eyes: Negative for redness and itching.  Respiratory: Positive for shortness of breath and wheezing. Negative  for cough and chest tightness.   Cardiovascular: Negative for palpitations and leg swelling.  Gastrointestinal: Negative for nausea and vomiting.  Genitourinary: Negative for dysuria.  Musculoskeletal: Negative for joint swelling.  Skin: Negative for rash.  Neurological: Negative for headaches.  Hematological: Does not bruise/bleed easily.  Psychiatric/Behavioral: Negative for dysphoric mood. The patient is not nervous/anxious.    Physical Exam:  General - No distress ENT - No sinus tenderness, no oral exudate, no LAN, no thyromegaly, b/l cerumen build up, pupils equal/reactive Cardiac -  s1s2 regular, no murmur, pulses symmetric Chest - No wheeze/rales/dullness, good air entry, normal respiratory excursion Back - No focal tenderness Abd - Soft, non-tender, no organomegaly, + bowel sounds Ext - No edema Neuro - Normal strength, cranial nerves intact Skin - No rashes Psych - Normal mood, and behavior  CHEST - 2 VIEW 04/08/12 Comparison: 01/22/2012  Findings: Lungs are clear. No pleural effusion or pneumothorax. Cardiomediastinal silhouette is within normal limits. Visualized osseous structures are within normal limits.  IMPRESSION:  No evidence of acute cardiopulmonary disease.  Original Report Authenticated By: Charline Bills, M.D.   Assessment/Plan:  Coralyn Helling, MD [facility] Pager:  [phone]

## 2012-06-04 NOTE — Progress Notes (Deleted)
  Subjective:    Patient ID: Nicholas Brooks, male    DOB: December 20, 1985, 27 y.o.   MRN: [id number]  HPI    Review of Systems  Constitutional: Negative for fever and unexpected weight change.  HENT: Negative for ear pain, nosebleeds, congestion, sore throat, rhinorrhea, sneezing, trouble swallowing, dental problem, postnasal drip and sinus pressure.   Eyes: Negative for redness and itching.  Respiratory: Positive for shortness of breath and wheezing. Negative for cough and chest tightness.   Cardiovascular: Negative for palpitations and leg swelling.  Gastrointestinal: Negative for nausea and vomiting.  Genitourinary: Negative for dysuria.  Musculoskeletal: Negative for joint swelling.  Skin: Negative for rash.  Neurological: Negative for headaches.  Hematological: Does not bruise/bleed easily.  Psychiatric/Behavioral: Negative for dysphoric mood. The patient is not nervous/anxious.        Objective:   Physical Exam        Assessment & Plan:

## 2012-08-06 ENCOUNTER — Ambulatory Visit: Payer: Medicaid Other | Admitting: Pulmonary Disease

## 2012-09-09 ENCOUNTER — Ambulatory Visit: Payer: Medicaid Other | Admitting: Pulmonary Disease

## 2012-09-13 ENCOUNTER — Ambulatory Visit: Payer: Medicaid Other | Admitting: Pulmonary Disease

## 2012-09-16 ENCOUNTER — Encounter: Payer: Self-pay | Admitting: Pulmonary Disease

## 2012-09-26 ENCOUNTER — Emergency Department (HOSPITAL_BASED_OUTPATIENT_CLINIC_OR_DEPARTMENT_OTHER)
Admission: EM | Admit: 2012-09-26 | Discharge: 2012-09-26 | Disposition: A | Discharge status: Home / Self Care | Payer: Medicaid Other | Attending: Emergency Medicine | Admitting: Emergency Medicine

## 2012-09-26 ENCOUNTER — Encounter (HOSPITAL_BASED_OUTPATIENT_CLINIC_OR_DEPARTMENT_OTHER): Payer: Self-pay

## 2012-09-26 DIAGNOSIS — J069 Acute upper respiratory infection, unspecified: Secondary | ICD-10-CM | POA: Insufficient documentation

## 2012-09-26 DIAGNOSIS — J3489 Other specified disorders of nose and nasal sinuses: Secondary | ICD-10-CM | POA: Insufficient documentation

## 2012-09-26 DIAGNOSIS — R059 Cough, unspecified: Secondary | ICD-10-CM | POA: Insufficient documentation

## 2012-09-26 DIAGNOSIS — Z872 Personal history of diseases of the skin and subcutaneous tissue: Secondary | ICD-10-CM | POA: Insufficient documentation

## 2012-09-26 DIAGNOSIS — J45909 Unspecified asthma, uncomplicated: Secondary | ICD-10-CM | POA: Insufficient documentation

## 2012-09-26 DIAGNOSIS — Z91040 Latex allergy status: Secondary | ICD-10-CM | POA: Insufficient documentation

## 2012-09-26 DIAGNOSIS — IMO0002 Reserved for concepts with insufficient information to code with codable children: Secondary | ICD-10-CM | POA: Insufficient documentation

## 2012-09-26 DIAGNOSIS — F17200 Nicotine dependence, unspecified, uncomplicated: Secondary | ICD-10-CM | POA: Insufficient documentation

## 2012-09-26 DIAGNOSIS — R05 Cough: Secondary | ICD-10-CM | POA: Insufficient documentation

## 2012-09-26 LAB — RAPID STREP SCREEN (MED CTR MEBANE ONLY): Streptococcus, Group A Screen (Direct): NEGATIVE

## 2012-09-26 NOTE — ED Provider Notes (Signed)
CSN: [id number]     Arrival date & time 09/26/12  4098 History     First MD Initiated Contact with Patient 09/26/12 0813     Chief Complaint  Patient presents with  . Sore Throat  . Cough  . Nasal Congestion   (Consider location/radiation/quality/duration/timing/severity/associated sxs/prior Treatment) Patient is a 27 y.o. male presenting with pharyngitis and cough.  Sore Throat  Cough  Pt with history of asthma reports he began having mild-moderate sore throat yesterday, associated with nasal congestion and dry cough. No fever, no difficulty swallowing. Concerned because his girlfriend was diagnosed with strep recently.   Past Medical History  Diagnosis Date  . Asthma   . Psoriasis    Past Surgical History  Procedure Laterality Date  . Mandible surgery    . Wisdom tooth extraction     Family History  Problem Relation Age of Onset  . Cancer Maternal Grandmother     colon  . Asthma Sister    History  Substance Use Topics  . Smoking status: Current Every Day Smoker -- 0.50 packs/day for 8 years    Types: Cigarettes  . Smokeless tobacco: Never Used  . Alcohol Use: No    Review of Systems  Respiratory: Positive for cough.    All other systems reviewed and are negative except as noted in HPI.   Allergies  Latex  Home Medications   Current Outpatient Rx  Name  Route  Sig  Dispense  Refill  . albuterol (PROVENTIL HFA;VENTOLIN HFA) 108 (90 BASE) MCG/ACT inhaler   Inhalation   Inhale 2 puffs into the lungs every 4 (four) hours as needed. For asthma/shortness of breath   1 Inhaler   5   . albuterol (PROVENTIL) (2.5 MG/3ML) 0.083% nebulizer solution   Nebulization   Take 3 mLs (2.5 mg total) by nebulization every 4 (four) hours as needed for wheezing.   30 vial   5   . beclomethasone (QVAR) 80 MCG/ACT inhaler   Inhalation   Inhale 1 puff into the lungs 2 (two) times daily.   1 Inhaler   6    BP 112/73  Pulse 94  Temp(Src) 98.1 F (36.7 C) (Oral)   Resp 18  Ht 5\' 8"  (1.727 m)  Wt 160 lb (72.576 kg)  BMI 24.33 kg/m2  SpO2 100% Physical Exam  Nursing note and vitals reviewed. Constitutional: He is oriented to person, place, and time. He appears well-developed and well-nourished.  HENT:  Head: Normocephalic and atraumatic.  Mouth/Throat: Uvula is midline, oropharynx is clear and moist and mucous membranes are normal. No oropharyngeal exudate.  Eyes: EOM are normal. Pupils are equal, round, and reactive to light.  Neck: Normal range of motion. Neck supple.  Cardiovascular: Normal rate, normal heart sounds and intact distal pulses.   Pulmonary/Chest: Effort normal and breath sounds normal.  Abdominal: Bowel sounds are normal. He exhibits no distension. There is no tenderness.  Musculoskeletal: Normal range of motion. He exhibits no edema and no tenderness.  Lymphadenopathy:    He has no cervical adenopathy.  Neurological: He is alert and oriented to person, place, and time. He has normal strength. No cranial nerve deficit or sensory deficit.  Skin: Skin is warm and dry. No rash noted.  Psychiatric: He has a normal mood and affect.    ED Course   Procedures (including critical care time)  Labs Reviewed  RAPID STREP SCREEN   No results found. 1. Viral URI     MDM  Strep neg,  likely viral URI. PCP followup. Symptomatic care OTC.   Elliemae Braman B. Bernette Mayers, MD 09/26/12 [phone]

## 2012-09-26 NOTE — ED Notes (Signed)
Pt reports onset of sore throat that started yesterday.  He developed cough and nasal congestions this am.

## 2012-09-28 LAB — CULTURE, GROUP A STREP

## 2012-10-21 ENCOUNTER — Emergency Department (HOSPITAL_BASED_OUTPATIENT_CLINIC_OR_DEPARTMENT_OTHER)
Admission: EM | Admit: 2012-10-21 | Discharge: 2012-10-21 | Disposition: A | Discharge status: Home / Self Care | Payer: Medicaid Other | Attending: Emergency Medicine | Admitting: Emergency Medicine

## 2012-10-21 ENCOUNTER — Encounter (HOSPITAL_BASED_OUTPATIENT_CLINIC_OR_DEPARTMENT_OTHER): Payer: Self-pay | Admitting: *Deleted

## 2012-10-21 DIAGNOSIS — S39012A Strain of muscle, fascia and tendon of lower back, initial encounter: Secondary | ICD-10-CM

## 2012-10-21 DIAGNOSIS — Y939 Activity, unspecified: Secondary | ICD-10-CM | POA: Insufficient documentation

## 2012-10-21 DIAGNOSIS — Y929 Unspecified place or not applicable: Secondary | ICD-10-CM | POA: Insufficient documentation

## 2012-10-21 DIAGNOSIS — Z79899 Other long term (current) drug therapy: Secondary | ICD-10-CM | POA: Insufficient documentation

## 2012-10-21 DIAGNOSIS — X58XXXA Exposure to other specified factors, initial encounter: Secondary | ICD-10-CM | POA: Insufficient documentation

## 2012-10-21 DIAGNOSIS — F17200 Nicotine dependence, unspecified, uncomplicated: Secondary | ICD-10-CM | POA: Insufficient documentation

## 2012-10-21 DIAGNOSIS — S335XXA Sprain of ligaments of lumbar spine, initial encounter: Secondary | ICD-10-CM | POA: Insufficient documentation

## 2012-10-21 DIAGNOSIS — Z872 Personal history of diseases of the skin and subcutaneous tissue: Secondary | ICD-10-CM | POA: Insufficient documentation

## 2012-10-21 DIAGNOSIS — J45909 Unspecified asthma, uncomplicated: Secondary | ICD-10-CM | POA: Insufficient documentation

## 2012-10-21 DIAGNOSIS — Z791 Long term (current) use of non-steroidal anti-inflammatories (NSAID): Secondary | ICD-10-CM | POA: Insufficient documentation

## 2012-10-21 DIAGNOSIS — Z91040 Latex allergy status: Secondary | ICD-10-CM | POA: Insufficient documentation

## 2012-10-21 DIAGNOSIS — IMO0002 Reserved for concepts with insufficient information to code with codable children: Secondary | ICD-10-CM | POA: Insufficient documentation

## 2012-10-21 MED ORDER — KETOROLAC TROMETHAMINE 60 MG/2ML IM SOLN
60.0000 mg | Freq: Once | INTRAMUSCULAR | Status: AC
  Administered 2012-10-21: 60 mg via INTRAMUSCULAR
  Filled 2012-10-21: qty 2

## 2012-10-21 MED ORDER — IBUPROFEN 800 MG PO TABS: 800.0000 mg | ORAL_TABLET | Freq: Three times a day (TID) | ORAL | Status: DC

## 2012-10-21 MED ORDER — METHOCARBAMOL 500 MG PO TABS: 500.0000 mg | ORAL_TABLET | Freq: Two times a day (BID) | ORAL | Status: DC

## 2012-10-21 NOTE — ED Provider Notes (Signed)
CSN: [id number]     Arrival date & time 10/21/12  1229 History   First MD Initiated Contact with Patient 10/21/12 1246     Chief Complaint  Patient presents with  . Back Pain   (Consider location/radiation/quality/duration/timing/severity/associated sxs/prior Treatment) HPI Comments: Patient is otherwise healthy 27 year old male who presents with several day history of lower back pain - reports pain worse with movement.  Denies radiation of pain, numbness, tingling, incontinence of urine or feces, urinary retention or inability to get an erection.  Patient is a 27 y.o. male presenting with back pain. The history is provided by the patient. No language interpreter was used.  Back Pain Location:  Lumbar spine Quality:  Aching and stabbing Radiates to:  Does not radiate Pain severity:  Moderate Pain is:  Worse during the day Onset quality:  Gradual Timing:  Constant Progression:  Worsening Chronicity:  New Context: lifting heavy objects   Context: not emotional stress, not falling, not jumping from heights, not MCA, not MVA, not occupational injury, not pedestrian accident, not physical stress, not recent illness, not recent injury and not twisting   Relieved by:  Nothing Worsened by:  Ambulation and bending Ineffective treatments:  NSAIDs Associated symptoms: no abdominal pain, no abdominal swelling, no bladder incontinence, no bowel incontinence, no chest pain, no dysuria, no fever, no headaches, no leg pain, no numbness, no paresthesias, no pelvic pain, no perianal numbness, no tingling, no weakness and no weight loss   Risk factors: no hx of cancer, not pregnant, no recent surgery and no steroid use     Past Medical History  Diagnosis Date  . Asthma   . Psoriasis    Past Surgical History  Procedure Laterality Date  . Mandible surgery    . Wisdom tooth extraction     Family History  Problem Relation Age of Onset  . Cancer Maternal Grandmother     colon  . Asthma Sister     History  Substance Use Topics  . Smoking status: Current Every Day Smoker -- 0.50 packs/day for 8 years    Types: Cigarettes  . Smokeless tobacco: Never Used  . Alcohol Use: No    Review of Systems  Constitutional: Negative for fever and weight loss.  Cardiovascular: Negative for chest pain.  Gastrointestinal: Negative for abdominal pain and bowel incontinence.  Genitourinary: Negative for bladder incontinence, dysuria and pelvic pain.  Musculoskeletal: Positive for back pain.  Neurological: Negative for tingling, weakness, numbness, headaches and paresthesias.  All other systems reviewed and are negative.    Allergies  Latex  Home Medications   Current Outpatient Rx  Name  Route  Sig  Dispense  Refill  . albuterol (PROVENTIL HFA;VENTOLIN HFA) 108 (90 BASE) MCG/ACT inhaler   Inhalation   Inhale 2 puffs into the lungs every 4 (four) hours as needed. For asthma/shortness of breath   1 Inhaler   5   . albuterol (PROVENTIL) (2.5 MG/3ML) 0.083% nebulizer solution   Nebulization   Take 3 mLs (2.5 mg total) by nebulization every 4 (four) hours as needed for wheezing.   30 vial   5   . beclomethasone (QVAR) 80 MCG/ACT inhaler   Inhalation   Inhale 1 puff into the lungs 2 (two) times daily.   1 Inhaler   6   . ibuprofen (ADVIL,MOTRIN) 800 MG tablet   Oral   Take 1 tablet (800 mg total) by mouth 3 (three) times daily.   21 tablet   0   .  methocarbamol (ROBAXIN) 500 MG tablet   Oral   Take 1 tablet (500 mg total) by mouth 2 (two) times daily.   20 tablet   0    BP 120/74  Pulse 80  Temp(Src) 98.4 F (36.9 C) (Oral)  SpO2 100% Physical Exam  Nursing note and vitals reviewed. Constitutional: He is oriented to person, place, and time. He appears well-developed. No distress.  HENT:  Head: Normocephalic and atraumatic.  Right Ear: External ear normal.  Left Ear: External ear normal.  Nose: Nose normal.  Mouth/Throat: Oropharynx is clear and moist. No  oropharyngeal exudate.  Eyes: Conjunctivae are normal. Pupils are equal, round, and reactive to light. No scleral icterus.  Neck: Normal range of motion. Neck supple.  Cardiovascular: Normal rate, regular rhythm and normal heart sounds.  Exam reveals no gallop and no friction rub.   No murmur heard. Pulmonary/Chest: Effort normal and breath sounds normal. No respiratory distress. He has no wheezes. He has no rales. He exhibits no tenderness.  Abdominal: Soft. Bowel sounds are normal. He exhibits no distension. There is no tenderness.  Musculoskeletal: He exhibits no edema.       Lumbar back: He exhibits decreased range of motion and tenderness.       Back:  Lymphadenopathy:    He has no cervical adenopathy.  Neurological: He is alert and oriented to person, place, and time. He displays normal reflexes. He exhibits normal muscle tone. Coordination normal.  Skin: Skin is warm and dry. No rash noted. No erythema. No pallor.  Psychiatric: He has a normal mood and affect. His behavior is normal. Judgment and thought content normal.    ED Course  Procedures (including critical care time) Labs Review Labs Reviewed - No data to display Imaging Review No results found.  MDM   1. Lumbar strain, initial encounter   Patient with normal examination and no alarming signs to suggest cauda equina, epidural abscess, or nerve compression.  Will continue with anti-inflammatories and muscle relaxation.    Izola Price Marisue Humble, PA-C 10/21/12 1347

## 2012-10-21 NOTE — ED Notes (Signed)
3 days of low back pain states he has been taking muscle relaxers but doesn't know the name of them but they arent working. States doesn't know if he injured it or not woke up Sat am with the pain Ambulatory with steady gait to exam room #8.

## 2012-10-21 NOTE — ED Provider Notes (Signed)
Medical screening examination/treatment/procedure(s) were performed by non-physician practitioner and as supervising physician I was immediately available for consultation/collaboration.   Bonny Egger B. Ilia Dimaano, MD 10/21/12 2135 

## 2012-11-18 ENCOUNTER — Emergency Department (HOSPITAL_BASED_OUTPATIENT_CLINIC_OR_DEPARTMENT_OTHER)
Admission: EM | Admit: 2012-11-18 | Discharge: 2012-11-18 | Disposition: A | Discharge status: Home / Self Care | Payer: Medicaid Other | Attending: Emergency Medicine | Admitting: Emergency Medicine

## 2012-11-18 ENCOUNTER — Encounter (HOSPITAL_BASED_OUTPATIENT_CLINIC_OR_DEPARTMENT_OTHER): Payer: Self-pay | Admitting: *Deleted

## 2012-11-18 DIAGNOSIS — J069 Acute upper respiratory infection, unspecified: Secondary | ICD-10-CM | POA: Insufficient documentation

## 2012-11-18 DIAGNOSIS — Z791 Long term (current) use of non-steroidal anti-inflammatories (NSAID): Secondary | ICD-10-CM | POA: Insufficient documentation

## 2012-11-18 DIAGNOSIS — Z79899 Other long term (current) drug therapy: Secondary | ICD-10-CM | POA: Insufficient documentation

## 2012-11-18 DIAGNOSIS — Z872 Personal history of diseases of the skin and subcutaneous tissue: Secondary | ICD-10-CM | POA: Insufficient documentation

## 2012-11-18 DIAGNOSIS — J45901 Unspecified asthma with (acute) exacerbation: Secondary | ICD-10-CM | POA: Insufficient documentation

## 2012-11-18 DIAGNOSIS — IMO0002 Reserved for concepts with insufficient information to code with codable children: Secondary | ICD-10-CM | POA: Insufficient documentation

## 2012-11-18 DIAGNOSIS — F17200 Nicotine dependence, unspecified, uncomplicated: Secondary | ICD-10-CM | POA: Insufficient documentation

## 2012-11-18 MED ORDER — PREDNISONE 20 MG PO TABS: 60.0000 mg | ORAL_TABLET | Freq: Every day | ORAL | Status: DC

## 2012-11-18 MED ORDER — ALBUTEROL SULFATE HFA 108 (90 BASE) MCG/ACT IN AERS
6.0000 | INHALATION_SPRAY | RESPIRATORY_TRACT | Status: AC
  Administered 2012-11-18 (×2): 6 via RESPIRATORY_TRACT
  Filled 2012-11-18: qty 6.7

## 2012-11-18 MED ORDER — PREDNISONE 50 MG PO TABS
60.0000 mg | ORAL_TABLET | Freq: Once | ORAL | Status: AC
  Administered 2012-11-18: 11:00:00 60 mg via ORAL
  Filled 2012-11-18 (×2): qty 1

## 2012-11-18 MED ORDER — ALBUTEROL SULFATE HFA 108 (90 BASE) MCG/ACT IN AERS
6.0000 | INHALATION_SPRAY | Freq: Once | RESPIRATORY_TRACT | Status: AC
  Administered 2012-11-18: 6 via RESPIRATORY_TRACT

## 2012-11-18 NOTE — ED Notes (Signed)
Pt reports cold symptoms/cough x 1 month.

## 2012-11-18 NOTE — ED Provider Notes (Signed)
CSN: [id number]     Arrival date & time 11/18/12  1010 History   First MD Initiated Contact with Patient 11/18/12 1018     Chief Complaint  Patient presents with  . Cough  . Nasal Congestion   (Consider location/radiation/quality/duration/timing/severity/associated sxs/prior Treatment) Patient is a 27 y.o. male presenting with URI.  URI Presenting symptoms: congestion, cough and sore throat (intermittent)   Presenting symptoms: no fever   Severity:  Moderate Onset quality:  Gradual Duration: Some symptoms for 4 weeks, but worse over 4 days. Timing:  Constant Progression:  Worsening Chronicity:  New Relieved by:  Nothing Ineffective treatments:  OTC medications and nebulizer treatments Associated symptoms: no sinus pain     Past Medical History  Diagnosis Date  . Asthma   . Psoriasis    Past Surgical History  Procedure Laterality Date  . Mandible surgery    . Wisdom tooth extraction     Family History  Problem Relation Age of Onset  . Cancer Maternal Grandmother     colon  . Asthma Sister    History  Substance Use Topics  . Smoking status: Current Every Day Smoker -- 0.50 packs/day for 8 years    Types: Cigarettes  . Smokeless tobacco: Never Used  . Alcohol Use: No    Review of Systems  Constitutional: Negative for fever.  HENT: Positive for congestion and sore throat (intermittent).   Respiratory: Positive for cough. Negative for shortness of breath.   Cardiovascular: Negative for chest pain.  Gastrointestinal: Negative for nausea, vomiting, abdominal pain and diarrhea.  All other systems reviewed and are negative.    Allergies  Review of patient's allergies indicates no active allergies.  Home Medications   Current Outpatient Rx  Name  Route  Sig  Dispense  Refill  . albuterol (PROVENTIL HFA;VENTOLIN HFA) 108 (90 BASE) MCG/ACT inhaler   Inhalation   Inhale 2 puffs into the lungs every 4 (four) hours as needed. For asthma/shortness of breath   1  Inhaler   5   . albuterol (PROVENTIL) (2.5 MG/3ML) 0.083% nebulizer solution   Nebulization   Take 3 mLs (2.5 mg total) by nebulization every 4 (four) hours as needed for wheezing.   30 vial   5   . beclomethasone (QVAR) 80 MCG/ACT inhaler   Inhalation   Inhale 1 puff into the lungs 2 (two) times daily.   1 Inhaler   6   . ibuprofen (ADVIL,MOTRIN) 800 MG tablet   Oral   Take 1 tablet (800 mg total) by mouth 3 (three) times daily.   21 tablet   0   . methocarbamol (ROBAXIN) 500 MG tablet   Oral   Take 1 tablet (500 mg total) by mouth 2 (two) times daily.   20 tablet   0    BP 144/108  Pulse 94  Temp(Src) 98.2 F (36.8 C) (Oral)  Resp 16  Ht 5\' 8"  (1.727 m)  Wt 180 lb (81.647 kg)  BMI 27.38 kg/m2  SpO2 96% Physical Exam  Nursing note and vitals reviewed. Constitutional: He is oriented to person, place, and time. He appears well-developed and well-nourished. No distress.  HENT:  Head: Normocephalic and atraumatic.  Nose: Mucosal edema present. Right sinus exhibits no maxillary sinus tenderness and no frontal sinus tenderness. Left sinus exhibits no maxillary sinus tenderness and no frontal sinus tenderness.  Mouth/Throat: Oropharynx is clear and moist. No trismus in the jaw. No edematous. No posterior oropharyngeal edema, posterior oropharyngeal erythema or tonsillar abscesses.  Eyes: Conjunctivae are normal. Pupils are equal, round, and reactive to light. No scleral icterus.  Neck: Neck supple.  Cardiovascular: Normal rate, regular rhythm, normal heart sounds and intact distal pulses.   No murmur heard. Pulmonary/Chest: Effort normal. No stridor. No respiratory distress. He has decreased breath sounds ( mildly decreased air movement). He has wheezes (with forced exhalation). He has no rales.  Abdominal: Soft. He exhibits no distension. There is no tenderness.  Musculoskeletal: Normal range of motion. He exhibits no edema.  Neurological: He is alert and oriented to  person, place, and time.  Skin: Skin is warm and dry. No rash noted.  Psychiatric: He has a normal mood and affect. His behavior is normal.    ED Course  Procedures (including critical care time) Labs Review Labs Reviewed - No data to display Imaging Review No results found.  MDM   1. Acute URI   2. Asthma exacerbation    27 year old male with a history of asthma presenting with URI symptoms and shortness of breath. Has some decreased air movement and mild wheezing. However, is in no distress with normal O2 sats.  Air movement and wheezing improved after several albuterol MDI treatments. Have low suspicion for pneumonia in this patient with no fevers and otherwise normal lung sounds. Advised symptomatic care for his URI. Will prescribe prednisone for his reactive airway disease.    Candyce Churn, MD 11/18/12 [phone]

## 2013-05-16 ENCOUNTER — Emergency Department (HOSPITAL_COMMUNITY)
Admission: EM | Admit: 2013-05-16 | Discharge: 2013-05-16 | Disposition: A | Discharge status: Home / Self Care | Payer: Medicaid Other | Attending: Emergency Medicine | Admitting: Emergency Medicine

## 2013-05-16 ENCOUNTER — Encounter (HOSPITAL_COMMUNITY): Payer: Self-pay | Admitting: Emergency Medicine

## 2013-05-16 DIAGNOSIS — N4889 Other specified disorders of penis: Secondary | ICD-10-CM | POA: Insufficient documentation

## 2013-05-16 DIAGNOSIS — F17200 Nicotine dependence, unspecified, uncomplicated: Secondary | ICD-10-CM | POA: Insufficient documentation

## 2013-05-16 DIAGNOSIS — N489 Disorder of penis, unspecified: Secondary | ICD-10-CM

## 2013-05-16 DIAGNOSIS — J45909 Unspecified asthma, uncomplicated: Secondary | ICD-10-CM | POA: Insufficient documentation

## 2013-05-16 DIAGNOSIS — Z79899 Other long term (current) drug therapy: Secondary | ICD-10-CM | POA: Insufficient documentation

## 2013-05-16 MED ORDER — CEPHALEXIN 250 MG PO CAPS
250.0000 mg | ORAL_CAPSULE | Freq: Once | ORAL | Status: AC
  Administered 2013-05-16: 250 mg via ORAL
  Filled 2013-05-16: qty 1

## 2013-05-16 MED ORDER — CEPHALEXIN 250 MG PO CAPS: 250.0000 mg | ORAL_CAPSULE | Freq: Four times a day (QID) | ORAL | Status: DC

## 2013-05-16 NOTE — Discharge Instructions (Signed)
You're being treated for superficial skin infection.  Please take the antibiotic as directed until, completed.  Also, recommended that, you apply warm compress 4-5 times a day after each warm compress expressing a small amount of fluid from the lesion if you develop new lesions.  Worsening.  Symptoms fever, swelling, despite therapy.  Please return for further evaluation

## 2013-05-16 NOTE — ED Provider Notes (Signed)
CSN: [id number]     Arrival date & time 05/16/13  1946 History  This chart was scribed for non-physician practitioner, Arman Filter, NP, working with Shanna Cisco, MD by Charline Bills, ED Scribe. This patient was seen in room WTR5/WTR5 and the patient's care was started at 8:14 PM.    Chief Complaint  Patient presents with  . Rash  . Blister    The history is provided by the patient. No language interpreter was used.   HPI Comments: Nicholas Brooks is a 28 y.o. male who presents to the Emergency Department complaining of a sudden rash on his L forearm onset last night. He also reports a blister on the penial shaft that he noticed this morning. He describes the blister as itchy and burning with walking. Pt has a history of shingles, but not similar to previous symtpoms.  Past Medical History  Diagnosis Date  . Asthma   . Psoriasis    Past Surgical History  Procedure Laterality Date  . Mandible surgery    . Wisdom tooth extraction     Family History  Problem Relation Age of Onset  . Cancer Maternal Grandmother     colon  . Asthma Sister    History  Substance Use Topics  . Smoking status: Current Every Day Smoker -- 0.50 packs/day for 8 years    Types: Cigarettes  . Smokeless tobacco: Never Used  . Alcohol Use: No    Review of Systems  Constitutional: Negative for fever.  Genitourinary: Negative for dysuria, discharge, penile swelling, scrotal swelling, penile pain and testicular pain.  Skin: Positive for rash and wound.  All other systems reviewed and are negative.    Allergies  Review of patient's allergies indicates no known allergies.  Home Medications   Current Outpatient Rx  Name  Route  Sig  Dispense  Refill  . albuterol (PROVENTIL HFA;VENTOLIN HFA) 108 (90 BASE) MCG/ACT inhaler   Inhalation   Inhale 2 puffs into the lungs every 4 (four) hours as needed. For asthma/shortness of breath   1 Inhaler   5   . albuterol (PROVENTIL) (2.5 MG/3ML) 0.083%  nebulizer solution   Nebulization   Take 3 mLs (2.5 mg total) by nebulization every 4 (four) hours as needed for wheezing.   30 vial   5   . cephALEXin (KEFLEX) 250 MG capsule   Oral   Take 1 capsule (250 mg total) by mouth 4 (four) times daily.   27 capsule   0    Triage Vitals: BP 132/65  Pulse 85  Temp(Src) 98.4 F (36.9 C) (Oral)  Resp 18  SpO2 100% Physical Exam  Vitals reviewed. Constitutional: He appears well-developed.  HENT:  Head: Normocephalic.  Eyes: Pupils are equal, round, and reactive to light.  Neck: Normal range of motion.  Cardiovascular: Normal rate.   Pulmonary/Chest: Effort normal.  Abdominal: Soft.  Genitourinary: Guaiac negative stool. No penile tenderness.  Patient has a single raised, fluctuant area at the 11:00 area, on the glans of his penis that he noticed this morning.  Patient does not report any penile discharge  Musculoskeletal: Normal range of motion.  Skin: Skin is warm. No rash noted.  No noted.  Rash on the arm.  Her flank at this time.  Patient does have areas of scratch the skin, where he, states he had the itching.  Last night, it is no longer itching     ED Course  Procedures (including critical care time) DIAGNOSTIC STUDIES:  Oxygen Saturation is 100% on RA, normal by my interpretation.    COORDINATION OF CARE: 8:33 PM-Discussed treatment plan which includes cold compresses and return precautions with pt at bedside. Ptt agreed to plan.   Labs Review Labs Reviewed - No data to display Imaging Review No results found.   EKG Interpretation None      MDM  Small amount of serous sanguinous fluid expressed from the lesion on his penis with gentle pressure.  Will prescribe Keflex 250 mg 4 times a day for 7 days, as well as warm compresses.  Patient has been instructed to return if he develops new or worsening symptoms Final diagnoses:  Penile lesion      I personally performed the services described in this documentation,  which was scribed in my presence. The recorded information has been reviewed and is accurate.   Arman FilterGail K Aaleyah Witherow, NP 05/16/13 2033

## 2013-05-16 NOTE — ED Notes (Signed)
Pt reports has rash and itching to the left antecubital space and the right flank, which the pt states started yesterday. Multiple, large superficial scratch marks observed at both locations. Pt also reports seeing one blister on his penis that he noticed this morning. Pt is A/O x4, in NAD, and vitals are WDL.

## 2013-05-17 NOTE — ED Provider Notes (Signed)
Medical screening examination/treatment/procedure(s) were performed by non-physician practitioner and as supervising physician I was immediately available for consultation/collaboration.   Megan E Docherty, MD 05/17/13 1038 

## 2014-01-05 ENCOUNTER — Ambulatory Visit (INDEPENDENT_AMBULATORY_CARE_PROVIDER_SITE_OTHER): Payer: 59 | Admitting: Pulmonary Disease

## 2014-01-05 ENCOUNTER — Encounter: Payer: Self-pay | Admitting: Pulmonary Disease

## 2014-01-05 VITALS — BP 142/72 | HR 64 | Ht 68.0 in | Wt 198.8 lb

## 2014-01-05 DIAGNOSIS — J45901 Unspecified asthma with (acute) exacerbation: Secondary | ICD-10-CM

## 2014-01-05 DIAGNOSIS — Z720 Tobacco use: Secondary | ICD-10-CM

## 2014-01-05 MED ORDER — ALBUTEROL SULFATE (2.5 MG/3ML) 0.083% IN NEBU: 2.5000 mg | INHALATION_SOLUTION | RESPIRATORY_TRACT | Status: DC | PRN

## 2014-01-05 MED ORDER — ALBUTEROL SULFATE HFA 108 (90 BASE) MCG/ACT IN AERS: 2.0000 | INHALATION_SPRAY | RESPIRATORY_TRACT | Status: DC | PRN

## 2014-01-05 MED ORDER — BECLOMETHASONE DIPROPIONATE 80 MCG/ACT IN AERS: 2.0000 | INHALATION_SPRAY | Freq: Two times a day (BID) | RESPIRATORY_TRACT | Status: DC

## 2014-01-05 NOTE — Patient Instructions (Signed)
Qvar two puffs twice per day >> rinse mouth after each use Follow up in 6 months

## 2014-01-05 NOTE — Progress Notes (Signed)
Chief Complaint  Patient presents with  . Follow-up    Pt states that he has been using nebulizer in the mornings and at bedtime d/t some breathing issues.     History of Present Illness: Nicholas Brooks is a 28 y.o. male smoker with asthma.  He continues to smoke.  He was using nicotine patch, but then switched to e cigarettes.  He ran out of Qvar >> needs to use albuterol more w/o Qvar.  He gets occasional cough and wheeze.  His sinuses are okay.  He is reluctant to get flu shot >> says he always gets sick with shot.   TESTS:   Past medical hx, Past surgical hx, Medications, Allergies, Family hx, Social hx all reviewed.   Physical Exam:  General - No distress ENT - No sinus tenderness, no oral exudate, no LAN Cardiac - s1s2 regular, no murmur Chest - No wheeze/rales/dullness Back - No focal tenderness Abd - Soft, non-tender Ext - No edema Neuro - Normal strength Skin - No rashes Psych - normal mood, and behavior   Assessment/Plan:  Asthma. Plan: - will restart Qvar - continue prn albuterol - will need PFT's at some point >> he wants to defer for now  Tobacco abuse. Plan: - reviewed smoking cessation options - advised that electronic cigarettes are not best option since he is still getting smoking inhalation exposure  He declined flu shot today.  Coralyn HellingVineet Chaitanya Amedee, MD [facility] Pager:  [phone]

## 2014-01-19 ENCOUNTER — Ambulatory Visit (INDEPENDENT_AMBULATORY_CARE_PROVIDER_SITE_OTHER): Payer: 59 | Admitting: Internal Medicine

## 2014-01-19 ENCOUNTER — Encounter: Payer: Self-pay | Admitting: Internal Medicine

## 2014-01-19 ENCOUNTER — Other Ambulatory Visit (INDEPENDENT_AMBULATORY_CARE_PROVIDER_SITE_OTHER): Payer: 59

## 2014-01-19 VITALS — BP 102/58 | HR 72 | Temp 98.0°F | Resp 16 | Ht 68.0 in | Wt 198.0 lb

## 2014-01-19 DIAGNOSIS — Z0000 Encounter for general adult medical examination without abnormal findings: Secondary | ICD-10-CM

## 2014-01-19 DIAGNOSIS — J4521 Mild intermittent asthma with (acute) exacerbation: Secondary | ICD-10-CM

## 2014-01-19 DIAGNOSIS — Z720 Tobacco use: Secondary | ICD-10-CM

## 2014-01-19 LAB — BASIC METABOLIC PANEL
BUN: 14 mg/dL (ref 6–23)
CALCIUM: 9.2 mg/dL (ref 8.4–10.5)
CO2: 28 mEq/L (ref 19–32)
Chloride: 105 mEq/L (ref 96–112)
Creatinine, Ser: 1 mg/dL (ref 0.4–1.5)
GFR: 93.42 mL/min (ref >60.00)
GLUCOSE: 97 mg/dL (ref 70–99)
Potassium: 4.6 mEq/L (ref 3.5–5.1)
SODIUM: 140 meq/L (ref 135–145)

## 2014-01-19 LAB — LIPID PANEL
Cholesterol: 164 mg/dL (ref 0–200)
HDL: 35.4 mg/dL — AB (ref >39.00)
LDL Cholesterol: 105 mg/dL — ABNORMAL HIGH (ref 0–99)
NONHDL: 128.6
Total CHOL/HDL Ratio: 5
Triglycerides: 116 mg/dL (ref 0.0–149.0)
VLDL: 23.2 mg/dL (ref 0.0–40.0)

## 2014-01-19 MED ORDER — PREDNISONE 20 MG PO TABS: 40.0000 mg | ORAL_TABLET | Freq: Every day | ORAL | Status: DC

## 2014-01-19 NOTE — Assessment & Plan Note (Addendum)
Would classify his asthma is mild intermittent with acute exacerbation today. We'll do prednisone 40 mg 5 days. No indication for antibiotics. Have asked him to start using his Qvar daily. He should be using albuterol only for shortness of breath and not on a routine scheduled basis. Initially limited airflow with significant wheezing. Given albuterol treatment in the office after which wheezing was much improved and airflow much improved.

## 2014-01-19 NOTE — Assessment & Plan Note (Signed)
Talked with patient about smoking cessation. Reminded him about the risks and harms of tobacco smoke including worsening of his asthma.

## 2014-01-19 NOTE — Patient Instructions (Signed)
We would like you to start taking the Qvar daily. This is a good medicine for helping to keep your lungs clear and breathing well. It works best when you take it every day.  We are also going to call in some prednisone for you to take for your breathing. Take 2 pills a day for the next 5 days until it's gone.  We will check your cholesterol in your kidneys today. We will call you back with the results. If you're doing well we'll see back in about one year. If you have any problems or questions before your next visit please feel free to call our office.  If it's possible would also be a good thing for you to stop smoking altogether and even to stop using the e-cigarette although this is better than smoking cigarettes.  Asthma Attack Prevention Although there is no way to prevent asthma from starting, you can take steps to control the disease and reduce its symptoms. Learn about your asthma and how to control it. Take an active role to control your asthma by working with your health care provider to create and follow an asthma action plan. An asthma action plan guides you in:  Taking your medicines properly.  Avoiding things that set off your asthma or make your asthma worse (asthma triggers).  Tracking your level of asthma control.  Responding to worsening asthma.  Seeking emergency care when needed. To track your asthma, keep records of your symptoms, check your peak flow number using a handheld device that shows how well air moves out of your lungs (peak flow meter), and get regular asthma checkups.  WHAT ARE SOME WAYS TO PREVENT AN ASTHMA ATTACK?  Take medicines as directed by your health care provider.  Keep track of your asthma symptoms and level of control.  With your health care provider, write a detailed plan for taking medicines and managing an asthma attack. Then be sure to follow your action plan. Asthma is an ongoing condition that needs regular monitoring and  treatment.  Identify and avoid asthma triggers. Many outdoor allergens and irritants (such as pollen, mold, cold air, and air pollution) can trigger asthma attacks. Find out what your asthma triggers are and take steps to avoid them.  Monitor your breathing. Learn to recognize warning signs of an attack, such as coughing, wheezing, or shortness of breath. Your lung function may decrease before you notice any signs or symptoms, so regularly measure and record your peak airflow with a home peak flow meter.  Identify and treat attacks early. If you act quickly, you are less likely to have a severe attack. You will also need less medicine to control your symptoms. When your peak flow measurements decrease and alert you to an upcoming attack, take your medicine as instructed and immediately stop any activity that may have triggered the attack. If your symptoms do not improve, get medical help.  Pay attention to increasing quick-relief inhaler use. If you find yourself relying on your quick-relief inhaler, your asthma is not under control. See your health care provider about adjusting your treatment. WHAT CAN MAKE MY SYMPTOMS WORSE? A number of common things can set off or make your asthma symptoms worse and cause temporary increased inflammation of your airways. Keep track of your asthma symptoms for several weeks, detailing all the environmental and emotional factors that are linked with your asthma. When you have an asthma attack, go back to your asthma diary to see which factor, or combination of  factors, might have contributed to it. Once you know what these factors are, you can take steps to control many of them. If you have allergies and asthma, it is important to take asthma prevention steps at home. Minimizing contact with the substance to which you are allergic will help prevent an asthma attack. Some triggers and ways to avoid these triggers are: Animal Dander:  Some people are allergic to the  flakes of skin or dried saliva from animals with fur or feathers.   There is no such thing as a hypoallergenic dog or cat breed. All dogs or cats can cause allergies, even if they don't shed.  Keep these pets out of your home.  If you are not able to keep a pet outdoors, keep the pet out of your bedroom and other sleeping areas at all times, and keep the door closed.  Remove carpets and furniture covered with cloth from your home. If that is not possible, keep the pet away from fabric-covered furniture and carpets. Dust Mites: Many people with asthma are allergic to dust mites. Dust mites are tiny bugs that are found in every home in mattresses, pillows, carpets, fabric-covered furniture, bedcovers, clothes, stuffed toys, and other fabric-covered items.   Cover your mattress in a special dust-proof cover.  Cover your pillow in a special dust-proof cover, or wash the pillow each week in hot water. Water must be hotter than 130 F (54.4 C) to kill dust mites. Cold or warm water used with detergent and bleach can also be effective.  Wash the sheets and blankets on your bed each week in hot water.  Try not to sleep or lie on cloth-covered cushions.  Call ahead when traveling and ask for a smoke-free hotel room. Bring your own bedding and pillows in case the hotel only supplies feather pillows and down comforters, which may contain dust mites and cause asthma symptoms.  Remove carpets from your bedroom and those laid on concrete, if you can.  Keep stuffed toys out of the bed, or wash the toys weekly in hot water or cooler water with detergent and bleach. Cockroaches: Many people with asthma are allergic to the droppings and remains of cockroaches.   Keep food and garbage in closed containers. Never leave food out.  Use poison baits, traps, powders, gels, or paste (for example, boric acid).  If a spray is used to kill cockroaches, stay out of the room until the odor goes away. Indoor  Mold:  Fix leaky faucets, pipes, or other sources of water that have mold around them.  Clean floors and moldy surfaces with a fungicide or diluted bleach.  Avoid using humidifiers, vaporizers, or swamp coolers. These can spread molds through the air. Pollen and Outdoor Mold:  When pollen or mold spore counts are high, try to keep your windows closed.  Stay indoors with windows closed from late morning to afternoon. Pollen and some mold spore counts are highest at that time.  Ask your health care provider whether you need to take anti-inflammatory medicine or increase your dose of the medicine before your allergy season starts. Other Irritants to Avoid:  Tobacco smoke is an irritant. If you smoke, ask your health care provider how you can quit. Ask family members to quit smoking, too. Do not allow smoking in your home or car.  If possible, do not use a wood-burning stove, kerosene heater, or fireplace. Minimize exposure to all sources of smoke, including incense, candles, fires, and fireworks.  Try to  stay away from strong odors and sprays, such as perfume, talcum powder, hair spray, and paints.  Decrease humidity in your home and use an indoor air cleaning device. Reduce indoor humidity to below 60%. Dehumidifiers or central air conditioners can do this.  Decrease house dust exposure by changing furnace and air cooler filters frequently.  Try to have someone else vacuum for you once or twice a week. Stay out of rooms while they are being vacuumed and for a short while afterward.  If you vacuum, use a dust mask from a hardware store, a double-layered or microfilter vacuum cleaner bag, or a vacuum cleaner with a HEPA filter.  Sulfites in foods and beverages can be irritants. Do not drink beer or wine or eat dried fruit, processed potatoes, or shrimp if they cause asthma symptoms.  Cold air can trigger an asthma attack. Cover your nose and mouth with a scarf on cold or windy  days.  Several health conditions can make asthma more difficult to manage, including a runny nose, sinus infections, reflux disease, psychological stress, and sleep apnea. Work with your health care provider to manage these conditions.  Avoid close contact with people who have a respiratory infection such as a cold or the flu, since your asthma symptoms may get worse if you catch the infection. Wash your hands thoroughly after touching items that may have been handled by people with a respiratory infection.  Get a flu shot every year to protect against the flu virus, which often makes asthma worse for days or weeks. Also get a pneumonia shot if you have not previously had one. Unlike the flu shot, the pneumonia shot does not need to be given yearly. Medicines:  Talk to your health care provider about whether it is safe for you to take aspirin or non-steroidal anti-inflammatory medicines (NSAIDs). In a small number of people with asthma, aspirin and NSAIDs can cause asthma attacks. These medicines must be avoided by people who have known aspirin-sensitive asthma. It is important that people with aspirin-sensitive asthma read labels of all over-the-counter medicines used to treat pain, colds, coughs, and fever.  Beta-blockers and ACE inhibitors are other medicines you should discuss with your health care provider. HOW CAN I FIND OUT WHAT I AM ALLERGIC TO? Ask your asthma health care provider about allergy skin testing or blood testing (the RAST test) to identify the allergens to which you are sensitive. If you are found to have allergies, the most important thing to do is to try to avoid exposure to any allergens that you are sensitive to as much as possible. Other treatments for allergies, such as medicines and allergy shots (immunotherapy) are available.  CAN I EXERCISE? Follow your health care provider's advice regarding asthma treatment before exercising. It is important to maintain a regular  exercise program, but vigorous exercise or exercise in cold, humid, or dry environments can cause asthma attacks, especially for those people who have exercise-induced asthma. Document Released: 01/18/2009 Document Revised: 02/04/2013 Document Reviewed: 08/07/2012 [facility] Patient Information 2015 [place], [place]. This information is not intended to replace advice given to you by your health care provider. Make sure you discuss any questions you have with your health care provider.

## 2014-01-19 NOTE — Progress Notes (Signed)
Pre visit review using our clinic review tool, if applicable. No additional management support is needed unless otherwise documented below in the visit note. 

## 2014-01-19 NOTE — Progress Notes (Signed)
   Subjective:    Patient ID: Nicholas Brooks, male    DOB: 13-Jun-1985, 28 y.o.   MRN: [id number]  HPI The patient is a 28 YO man who comes in today to establish care. He is a past history of asthma, tobacco abuse. He states his asthma has been worse the past 1-2 days, he denies productive cough, fevers, chills. He has been trying to quit smoking however has gone back and forth between cigarettes and use cigarettes. His power was off this morning so when he went to use his nebulizer he was unable to do so. He is now more short of breath. He has not been using his Qvar at all although he did use it this morning. He denies any worsening of his allergies or nasal congestion. He denies any chest pain, abdominal pain, acid reflux, constipation, diarrhea.  Review of Systems  Constitutional: Negative for fever, chills, activity change, appetite change, fatigue and unexpected weight change.  HENT: Negative for congestion.   Eyes: Negative.   Respiratory: Positive for cough, shortness of breath and wheezing. Negative for chest tightness.   Cardiovascular: Negative for chest pain, palpitations and leg swelling.  Gastrointestinal: Negative.   Musculoskeletal: Negative.   Skin: Negative.   Neurological: Negative.       Objective:   Physical Exam  Constitutional: He is oriented to person, place, and time. He appears well-developed and well-nourished.  HENT:  Head: Normocephalic and atraumatic.  Nose: Nose normal.  Mouth/Throat: Oropharynx is clear and moist.  Eyes: EOM are normal.  Neck: Normal range of motion.  Cardiovascular: Normal rate and regular rhythm.   Pulmonary/Chest: Effort normal. He has wheezes.  Before albuterol treatment much wheezing, poor air flow. After albuterol treatment limited wheezing good airflow.  Abdominal: Soft. Bowel sounds are normal. He exhibits no distension. There is no tenderness. There is no rebound.  Musculoskeletal: He exhibits no edema.  Neurological: He is  alert and oriented to person, place, and time. Coordination normal.  Skin: Skin is warm and dry.   Filed Vitals:   01/19/14 1103  BP: 102/58  Pulse: 72  Temp: 98 F (36.7 C)  TempSrc: Oral  Resp: 16  Height: 5\' 8"  (1.727 m)  Weight: 198 lb (89.812 kg)  SpO2: 95%      Assessment & Plan:

## 2014-01-19 NOTE — Assessment & Plan Note (Signed)
Check BMP, lipid panel today. Talked to him about smoking cessation.

## 2014-11-17 ENCOUNTER — Other Ambulatory Visit: Payer: Self-pay | Admitting: Pulmonary Disease

## 2014-12-05 ENCOUNTER — Other Ambulatory Visit: Payer: Self-pay | Admitting: Pulmonary Disease

## 2014-12-07 ENCOUNTER — Telehealth: Payer: Self-pay | Admitting: Pulmonary Disease

## 2014-12-07 MED ORDER — ALBUTEROL SULFATE (2.5 MG/3ML) 0.083% IN NEBU: INHALATION_SOLUTION | RESPIRATORY_TRACT | Status: DC

## 2014-12-07 NOTE — Telephone Encounter (Signed)
Called pt. appt scheduled to see TP tomorrow for OV. Refill sent in. Nothing further needed

## 2014-12-08 ENCOUNTER — Ambulatory Visit (INDEPENDENT_AMBULATORY_CARE_PROVIDER_SITE_OTHER)
Admission: RE | Admit: 2014-12-08 | Discharge: 2014-12-08 | Disposition: A | Discharge status: Home / Self Care | Payer: 59 | Source: Ambulatory Visit | Attending: Adult Health | Admitting: Adult Health

## 2014-12-08 ENCOUNTER — Ambulatory Visit (INDEPENDENT_AMBULATORY_CARE_PROVIDER_SITE_OTHER): Payer: 59 | Admitting: Adult Health

## 2014-12-08 ENCOUNTER — Encounter: Payer: Self-pay | Admitting: Adult Health

## 2014-12-08 VITALS — BP 118/88 | HR 73 | Temp 97.6°F | Ht 68.0 in | Wt 213.0 lb

## 2014-12-08 DIAGNOSIS — R0602 Shortness of breath: Secondary | ICD-10-CM

## 2014-12-08 DIAGNOSIS — J4541 Moderate persistent asthma with (acute) exacerbation: Secondary | ICD-10-CM

## 2014-12-08 DIAGNOSIS — Z23 Encounter for immunization: Secondary | ICD-10-CM | POA: Diagnosis not present

## 2014-12-08 IMAGING — DX DG CHEST 2V
2 series · 2 of 2 positions shown · non-contrast
Comparison: PA and lateral chest x-ray [DATE]

CLINICAL DATA: Intermittent right lower anterior lateral chest pain
for the past year; 2 weeks of cough and shortness of breath; current
smoker, history of asthma.

EXAM:
CHEST  2 VIEW

[chest pa]
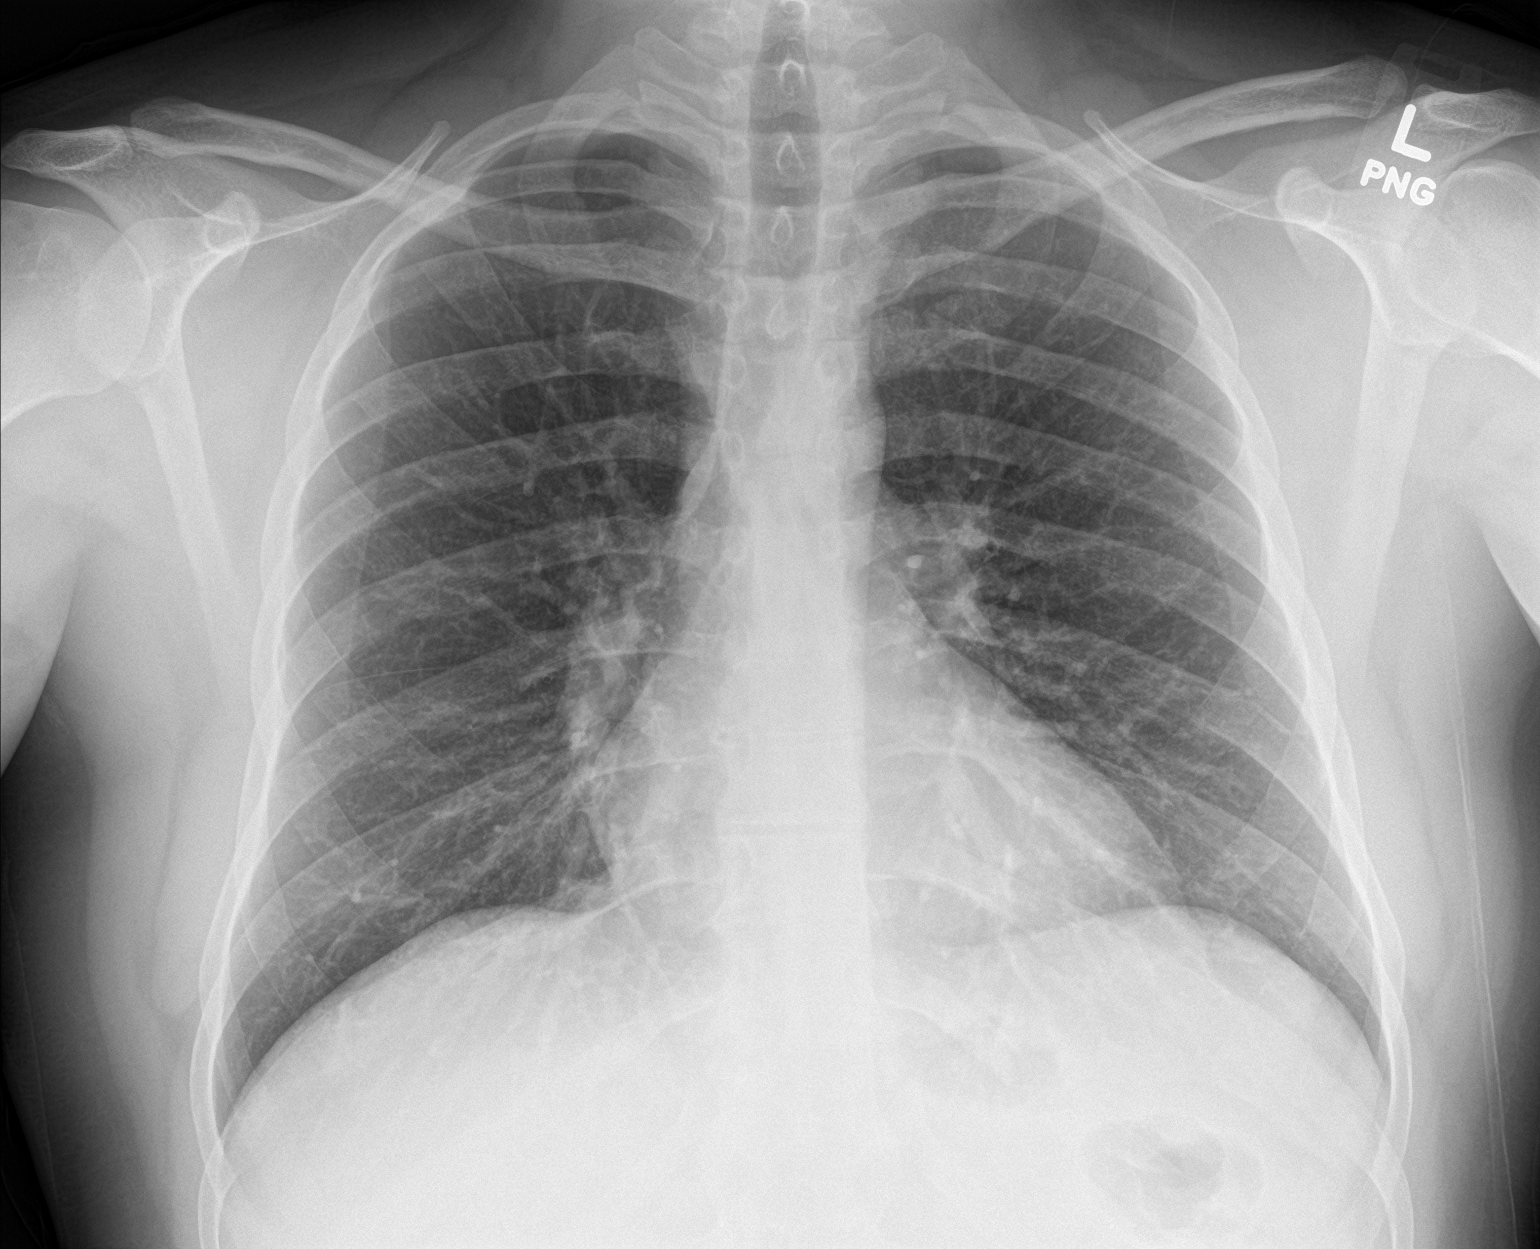

[chest lat]
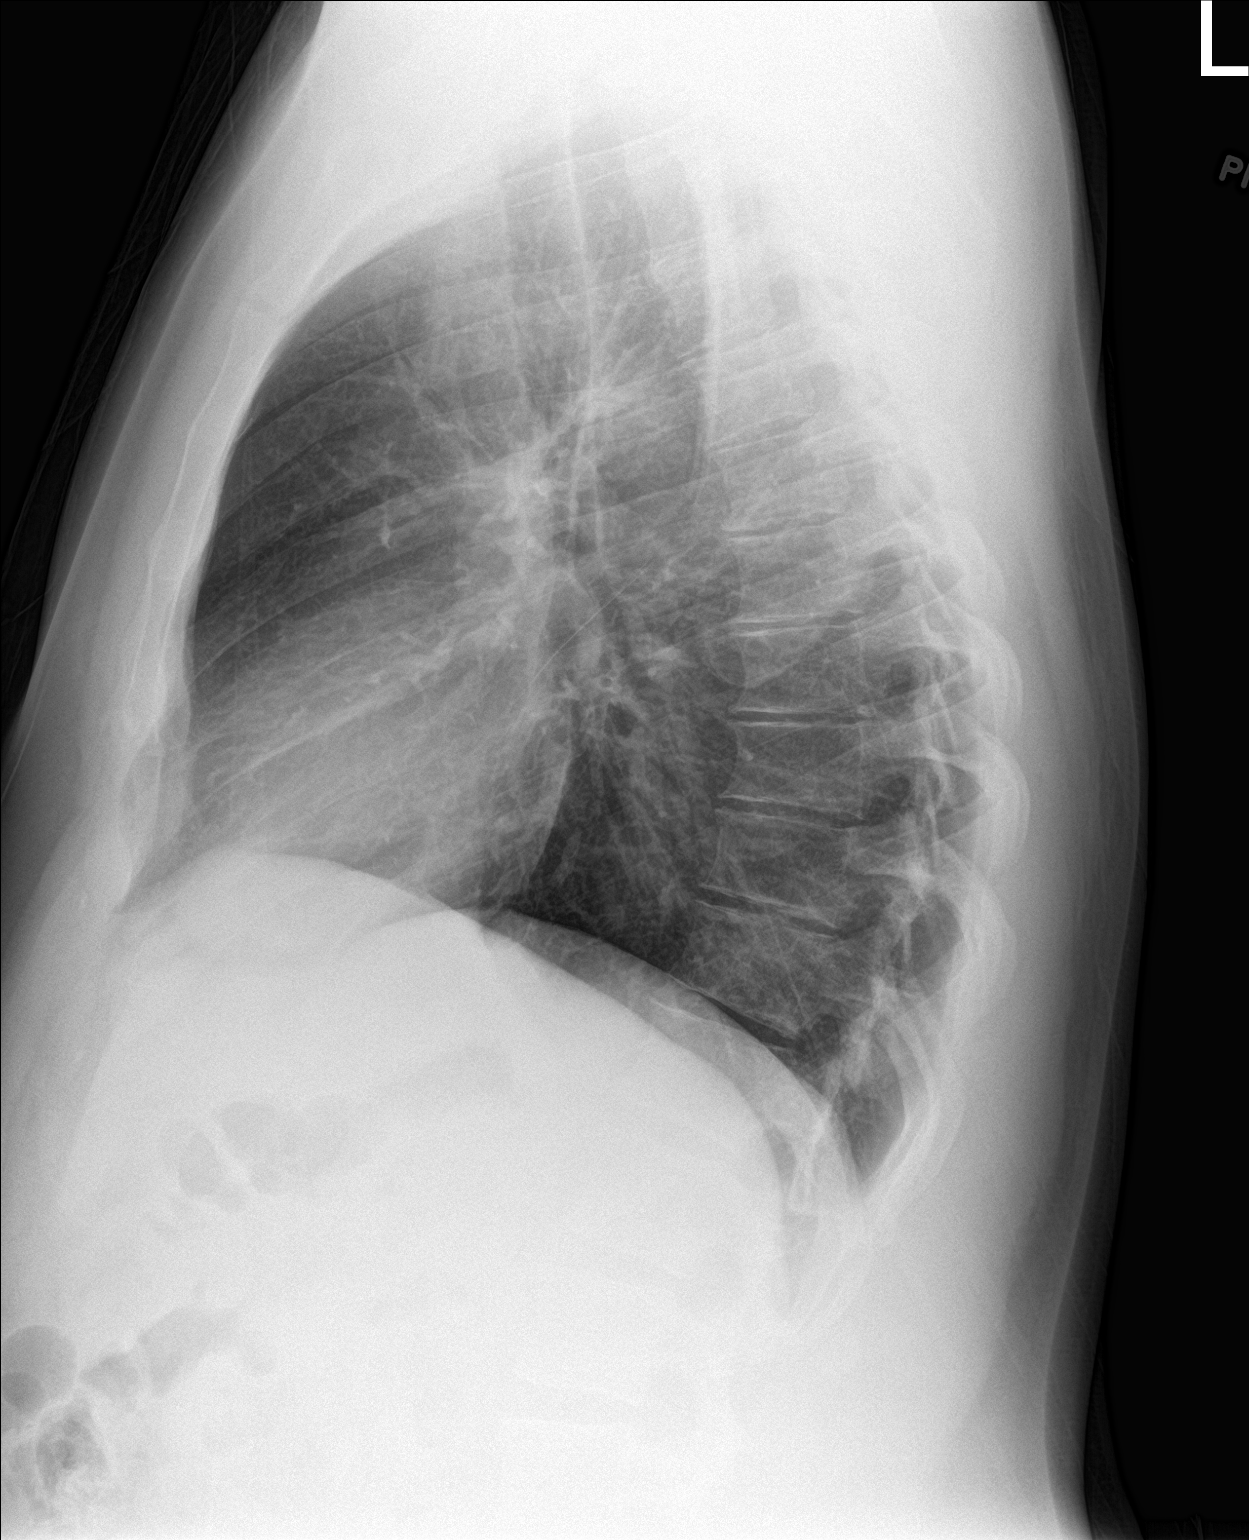

[2 of 2 positions shown; findings below may reference images not displayed]

FINDINGS: The lungs are adequately inflated. The interstitial markings are
coarse but stable. The heart and pulmonary vascularity are normal.
The mediastinum is normal in width. There is no pleural effusion.
The bony thorax exhibits no acute abnormality.
IMPRESSION: There is no active cardiopulmonary disease. Stable interstitial
prominence likely reflects the patient's smoking history and history
of asthma.

## 2014-12-08 MED ORDER — BUDESONIDE-FORMOTEROL FUMARATE 160-4.5 MCG/ACT IN AERO: 2.0000 | INHALATION_SPRAY | Freq: Two times a day (BID) | RESPIRATORY_TRACT | Status: DC

## 2014-12-08 NOTE — Patient Instructions (Addendum)
Begin Symbicort 2 puffs Twice daily   Stop QVAR .  Chest xray today .  May try Zyrtec 10mg  At bedtime  As needed  Drainage  Saline nasal rinses As needed   Work on not smoking  follow up Dr. Craige CottaSood  In 3 months with PFT and As needed   Please contact office for sooner follow up if symptoms do not improve or worsen or seek emergency care  Flu shot today .

## 2014-12-08 NOTE — Progress Notes (Signed)
   Subjective:    Patient ID: Nicholas Brooks, male    DOB: May 15, 1985, 29 y.o.   MRN: [id number]  HPI 29 year old male smoker with asthma  12/08/2014 Follow-up Asthma Patient presents for a yearly follow-up for his asthma. Patient is supposed to be using the Qvar twice daily. Does admit that he misses this on occasion. Says that he's been noticing more cough and wheezing over the last few weeks. He's also been having some right rib pain on occasion with cough and deep inspiration. He denies any known injury, rash, exertional chest pain, hemoptysis, orthopnea, PND, or increased leg swelling. Unfortunately, patient continues to smoke, smoking cessation was discussed Patient did not return for pulmonary function tests as previously requested. Spirometry today shows an FEV1 at 59%, ratio 62, FVC 80%.     Review of Systems Constitutional:   No  weight loss, night sweats,  Fevers, chills, fatigue, or  lassitude.  HEENT:   No headaches,  Difficulty swallowing,  Tooth/dental problems, or  Sore throat,                No sneezing, itching, ear ache,  +nasal congestion, post nasal drip,   CV:  No chest pain,  Orthopnea, PND, swelling in lower extremities, anasarca, dizziness, palpitations, syncope.   GI  No heartburn, indigestion, abdominal pain, nausea, vomiting, diarrhea, change in bowel habits, loss of appetite, bloody stools.   Resp:    No chest wall deformity  Skin: no rash or lesions.  GU: no dysuria, change in color of urine, no urgency or frequency.  No flank pain, no hematuria   MS:  No joint pain or swelling.  No decreased range of motion.  No back pain.  Psych:  No change in mood or affect. No depression or anxiety.  No memory loss.         Objective:   Physical Exam  GEN: A/Ox3; pleasant , NAD, well nourished   HEENT:  [place]/AT,  EACs-clear, TMs-wnl, NOSE-clear drainage  THROAT-clear, no lesions, no postnasal drip or exudate noted.   NECK:  Supple w/ fair ROM; no JVD;  normal carotid impulses w/o bruits; no thyromegaly or nodules palpated; no lymphadenopathy.  RESP  Clear  P & A; w/o, wheezes/ rales/ or rhonchi.no accessory muscle use, no dullness to percussion  CARD:  RRR, no m/r/g  , no peripheral edema, pulses intact, no cyanosis or clubbing.  GI:   Soft & nt; nml bowel sounds; no organomegaly or masses detected.  Musco: Warm bil, no deformities or joint swelling noted.   Neuro: alert, no focal deficits noted.    Skin: Warm, no lesions or rashes        Assessment & Plan:

## 2014-12-08 NOTE — Assessment & Plan Note (Signed)
Mild asthma flare in active smoker Will control for allergic rhinitis symptoms. Spirometry  does show some moderate obstruction. Will change over from Qvar to Symbicort. Check chest x-ray today. Needs PFTs on return  Plan Begin Symbicort 2 puffs Twice daily   Stop QVAR .  Chest xray today .  May try Zyrtec 10mg  At bedtime  As needed  Drainage  Saline nasal rinses As needed   Work on not smoking  follow up Dr. Craige CottaSood  In 3 months with PFT and As needed   Please contact office for sooner follow up if symptoms do not improve or worsen or seek emergency care  Flu shot today .

## 2014-12-09 ENCOUNTER — Telehealth: Payer: Self-pay | Admitting: Adult Health

## 2014-12-09 NOTE — Progress Notes (Signed)
Quick Note:  Pt notified of results ______ 

## 2014-12-09 NOTE — Progress Notes (Signed)
Reviewed and agree with assessment/plan. 

## 2014-12-09 NOTE — Telephone Encounter (Signed)
Notes Recorded by Julio Sicksammy S Parrett, NP on 12/08/2014 at 9:29 PM No sign of pna , bronchitic changes  Cont w/ ov recs  Please contact office for sooner follow up if symptoms do not improve or worsen or seek emergency care       Spoke with pt and notified of results per Rubye Oaksammy Parrett, NP Pt verbalized understanding and denied any questions.

## 2014-12-23 ENCOUNTER — Encounter (HOSPITAL_COMMUNITY): Payer: Self-pay | Admitting: Emergency Medicine

## 2014-12-23 ENCOUNTER — Emergency Department (HOSPITAL_COMMUNITY)
Admission: EM | Admit: 2014-12-23 | Discharge: 2014-12-23 | Disposition: A | Discharge status: Home / Self Care | Payer: Medicaid Other | Attending: Emergency Medicine | Admitting: Emergency Medicine

## 2014-12-23 ENCOUNTER — Emergency Department (HOSPITAL_COMMUNITY): Payer: Medicaid Other

## 2014-12-23 DIAGNOSIS — R0602 Shortness of breath: Secondary | ICD-10-CM | POA: Diagnosis present

## 2014-12-23 DIAGNOSIS — Z872 Personal history of diseases of the skin and subcutaneous tissue: Secondary | ICD-10-CM | POA: Insufficient documentation

## 2014-12-23 DIAGNOSIS — Z79899 Other long term (current) drug therapy: Secondary | ICD-10-CM | POA: Diagnosis not present

## 2014-12-23 DIAGNOSIS — Z720 Tobacco use: Secondary | ICD-10-CM | POA: Diagnosis not present

## 2014-12-23 DIAGNOSIS — J45901 Unspecified asthma with (acute) exacerbation: Secondary | ICD-10-CM | POA: Diagnosis not present

## 2014-12-23 DIAGNOSIS — J40 Bronchitis, not specified as acute or chronic: Secondary | ICD-10-CM

## 2014-12-23 LAB — CBC WITH DIFFERENTIAL/PLATELET
BASOS PCT: 0 %
Basophils Absolute: 0 10*3/uL (ref 0.0–0.1)
EOS ABS: 0.1 10*3/uL (ref 0.0–0.7)
EOS PCT: 1 %
HCT: 43.5 % (ref 39.0–52.0)
Hemoglobin: 14.7 g/dL (ref 13.0–17.0)
LYMPHS ABS: 2.1 10*3/uL (ref 0.7–4.0)
Lymphocytes Relative: 18 %
MCH: 31 pg (ref 26.0–34.0)
MCHC: 33.8 g/dL (ref 30.0–36.0)
MCV: 91.8 fL (ref 78.0–100.0)
MONO ABS: 1.2 10*3/uL — AB (ref 0.1–1.0)
MONOS PCT: 10 %
Neutro Abs: 8 10*3/uL — ABNORMAL HIGH (ref 1.7–7.7)
Neutrophils Relative %: 71 %
PLATELETS: 262 10*3/uL (ref 150–400)
RBC: 4.74 MIL/uL (ref 4.22–5.81)
RDW: 14.6 % (ref 11.5–15.5)
WBC: 11.4 10*3/uL — ABNORMAL HIGH (ref 4.0–10.5)

## 2014-12-23 LAB — BASIC METABOLIC PANEL
Anion gap: 9 (ref 5–15)
BUN: 12 mg/dL (ref 6–20)
CALCIUM: 8.9 mg/dL (ref 8.9–10.3)
CHLORIDE: 106 mmol/L (ref 101–111)
CO2: 23 mmol/L (ref 22–32)
CREATININE: 0.9 mg/dL (ref 0.61–1.24)
GFR calc non Af Amer: >60 mL/min (ref >60)
Glucose, Bld: 96 mg/dL (ref 65–99)
Potassium: 3.6 mmol/L (ref 3.5–5.1)
Sodium: 138 mmol/L (ref 135–145)

## 2014-12-23 IMAGING — CR DG CHEST 2V
2 series · 2 of 2 positions shown · non-contrast
Comparison: [DATE]

CLINICAL DATA: Wheezing. Cough and congestion. Short of breath and
chest pain

EXAM:
CHEST  2 VIEW

[w chest pa]
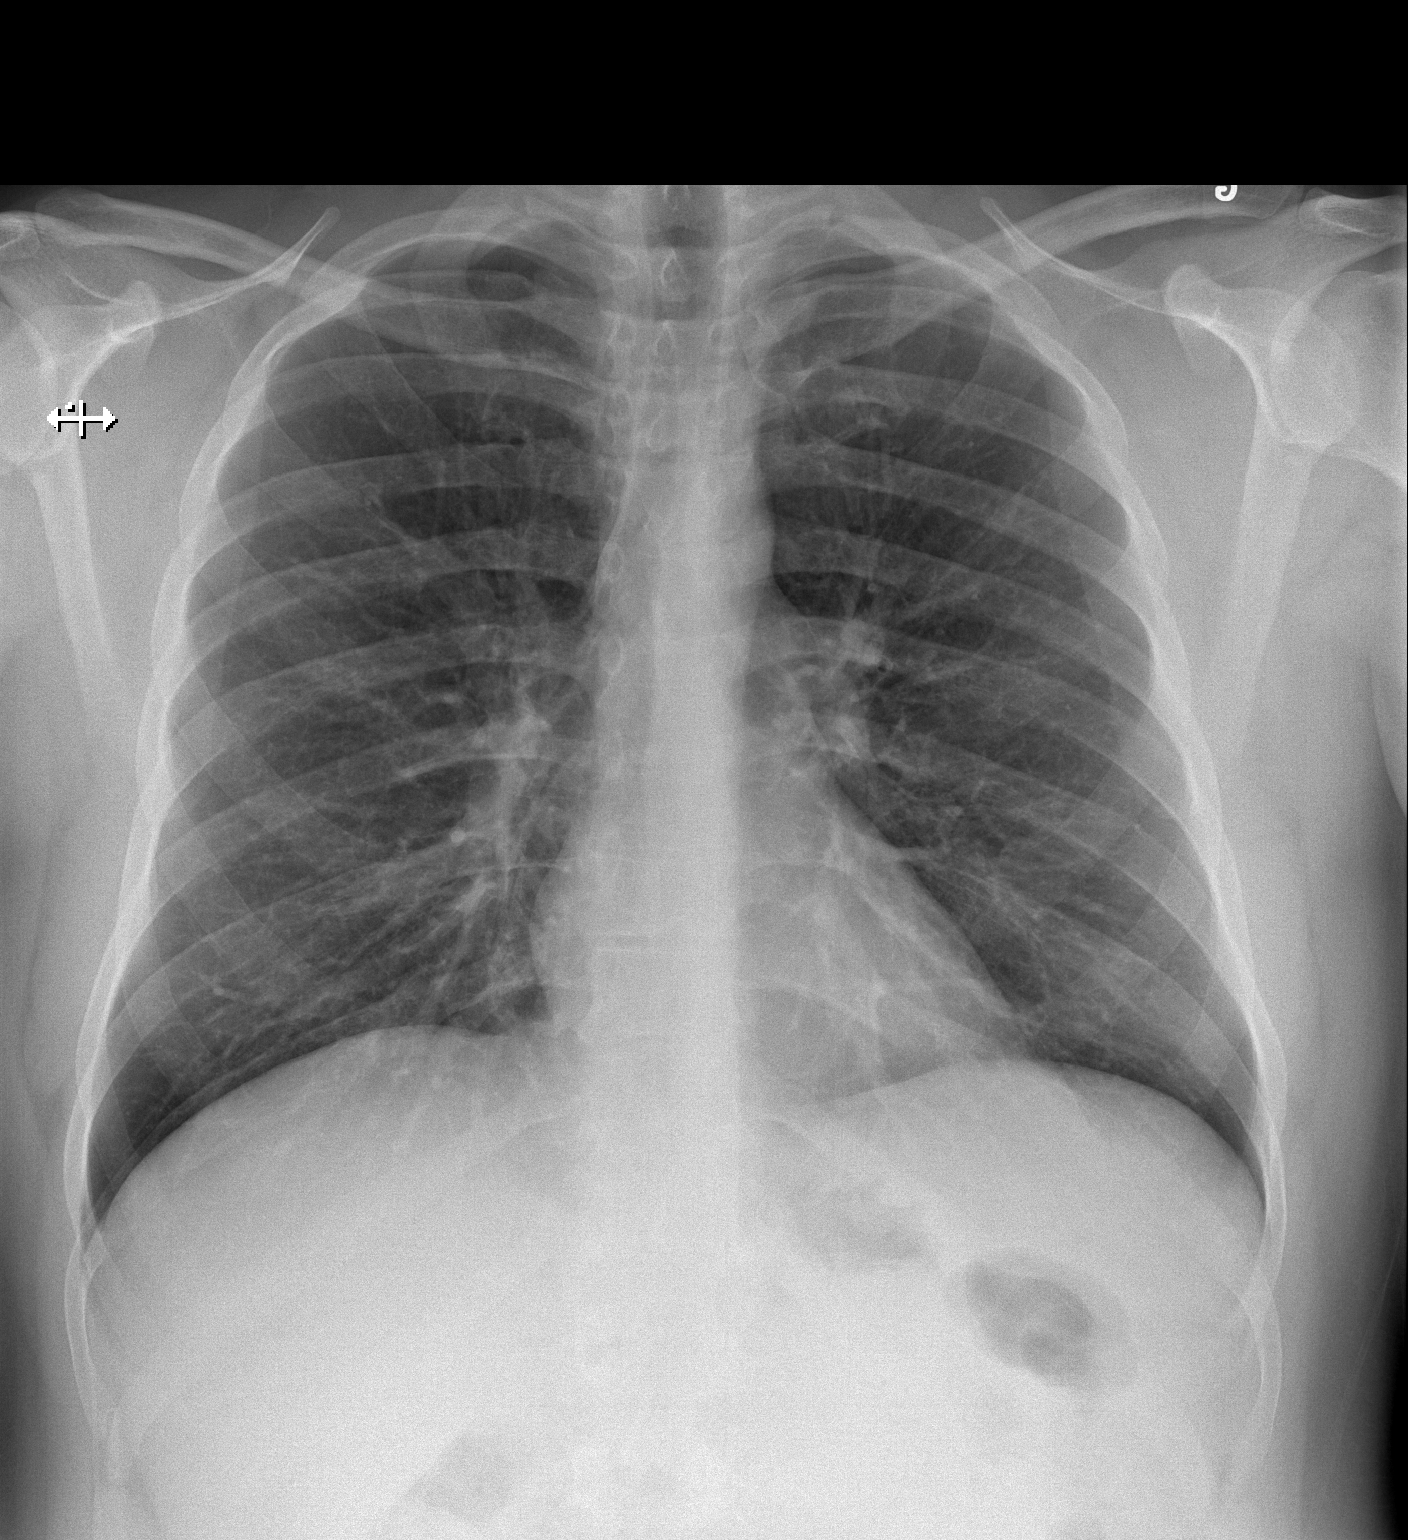

[w chest lat]
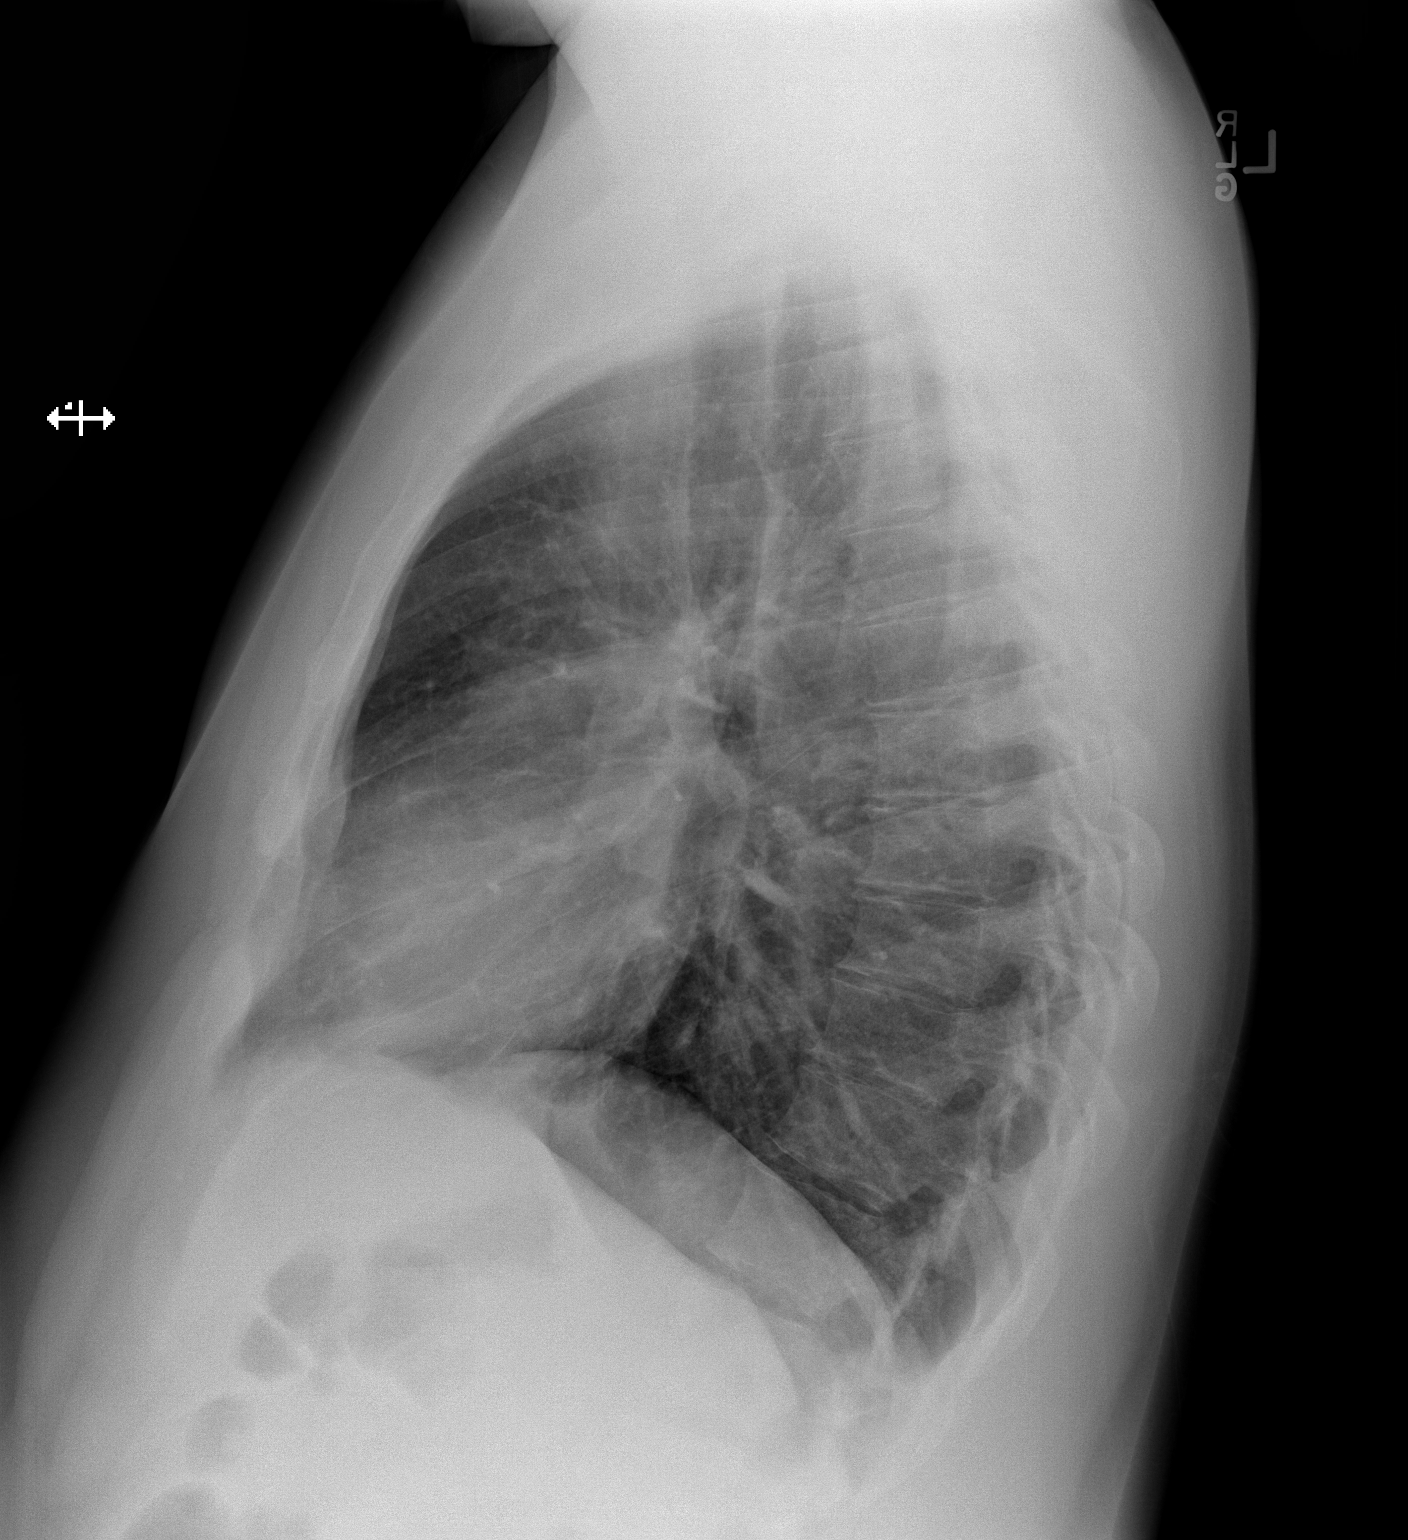

[2 of 2 positions shown; findings below may reference images not displayed]

FINDINGS: The heart size and mediastinal contours are within normal limits.
Both lungs are clear. The visualized skeletal structures are
unremarkable.
IMPRESSION: No active cardiopulmonary disease.

## 2014-12-23 MED ORDER — DOXYCYCLINE HYCLATE 100 MG PO CAPS: 100.0000 mg | ORAL_CAPSULE | Freq: Two times a day (BID) | ORAL | Status: DC

## 2014-12-23 MED ORDER — ALBUTEROL SULFATE (2.5 MG/3ML) 0.083% IN NEBU
5.0000 mg | INHALATION_SOLUTION | Freq: Once | RESPIRATORY_TRACT | Status: AC
  Administered 2014-12-23: 5 mg via RESPIRATORY_TRACT
  Filled 2014-12-23: qty 6

## 2014-12-23 MED ORDER — DEXAMETHASONE SODIUM PHOSPHATE 10 MG/ML IJ SOLN
10.0000 mg | Freq: Once | INTRAMUSCULAR | Status: AC
  Administered 2014-12-23: 10 mg via INTRAMUSCULAR
  Filled 2014-12-23: qty 1

## 2014-12-23 NOTE — ED Notes (Signed)
Seen by urgent care yesterday, diagnosed with pnuemonia today, hx of asthma. Complaining of increasing SOB, used nebulizer prior to arrival. Vitals stable, in no obvious distress, denies pain. He states they did not prescribe him any antibiotics.

## 2014-12-23 NOTE — Discharge Instructions (Signed)

## 2014-12-23 NOTE — ED Notes (Signed)
Patient transported to X-ray 

## 2014-12-23 NOTE — ED Provider Notes (Signed)
CSN: [id number]     Arrival date & time 12/23/14  [id number] History   First MD Initiated Contact with Patient 12/23/14 0914     Chief Complaint  Patient presents with  . Shortness of Breath  . Cough      HPI  Expand All Collapse All   Seen by urgent care yesterday, diagnosed with pnuemonia today, hx of asthma. Complaining of increasing SOB, used nebulizer prior to arrival. Vitals stable, in no obvious distress, denies pain. He states they did not prescribe him any antibiotics.        Past Medical History  Diagnosis Date  . Asthma   . Psoriasis    Past Surgical History  Procedure Laterality Date  . Mandible surgery    . Wisdom tooth extraction     Family History  Problem Relation Age of Onset  . Cancer Maternal Grandmother     colon  . Asthma Sister    Social History  Substance Use Topics  . Smoking status: Current Every Day Smoker -- 0.50 packs/day for 8 years    Types: Cigarettes  . Smokeless tobacco: Never Used     Comment: TRYING TO QUIT-- USING E-CIG (LIMITED CIGARETTES)  . Alcohol Use: No    Review of Systems  All other systems reviewed and are negative  Allergies  Review of patient's allergies indicates no known allergies.  Home Medications   Prior to Admission medications   Medication Sig Start Date End Date Taking? Authorizing Provider  albuterol (PROVENTIL HFA;VENTOLIN HFA) 108 (90 BASE) MCG/ACT inhaler Inhale 2 puffs into the lungs every 4 (four) hours as needed. For asthma/shortness of breath 01/05/14  Yes Coralyn HellingVineet Sood, MD  albuterol (PROVENTIL) (2.5 MG/3ML) 0.083% nebulizer solution USE ONE VIAL IN NEBULIZER EVERY 4 HOURS AS NEEDED FOR WHEEZING 12/07/14  Yes Coralyn HellingVineet Sood, MD  brompheniramine-pseudoephedrine-DM 30-2-10 MG/5ML syrup Take 5 mLs by mouth every 8 (eight) hours as needed (cough).   Yes Historical Provider, MD  budesonide-formoterol (SYMBICORT) 160-4.5 MCG/ACT inhaler Inhale 2 puffs into the lungs 2 (two) times daily. 12/08/14  Yes Tammy S  Parrett, NP  HYDROcodone-acetaminophen (NORCO/VICODIN) 5-325 MG tablet Take 1 tablet by mouth every 6 (six) hours as needed for moderate pain.   Yes Historical Provider, MD  OVER THE COUNTER MEDICATION Take 1 Dose by mouth every 4 (four) hours as needed (cold symptoms). Dollar store liquid cold medicine   Yes Historical Provider, MD  doxycycline (VIBRAMYCIN) 100 MG capsule Take 1 capsule (100 mg total) by mouth 2 (two) times daily. 12/23/14   Nelva Nayobert Manpreet Kemmer, MD   BP 134/72 mmHg  Pulse 99  Temp(Src) 98 F (36.7 C) (Oral)  Resp 20  SpO2 97% Physical Exam  Constitutional: He is oriented to person, place, and time. He appears well-developed and well-nourished. No distress.  HENT:  Head: Normocephalic and atraumatic.  Eyes: Pupils are equal, round, and reactive to light.  Neck: Normal range of motion.  Cardiovascular: Normal rate and intact distal pulses.   Pulmonary/Chest: No accessory muscle usage. No respiratory distress. He has wheezes.  Abdominal: Normal appearance. He exhibits no distension.  Musculoskeletal: Normal range of motion.  Neurological: He is alert and oriented to person, place, and time. No cranial nerve deficit.  Skin: Skin is warm and dry. No rash noted.  Psychiatric: He has a normal mood and affect. His behavior is normal.  Nursing note and vitals reviewed.   ED Course  Procedures (including critical care time) Medications  dexamethasone (DECADRON) injection 10 mg (  not administered)  albuterol (PROVENTIL) (2.5 MG/3ML) 0.083% nebulizer solution 5 mg (5 mg Nebulization Given 12/23/14 0936)    Labs Review Labs Reviewed  CBC WITH DIFFERENTIAL/PLATELET - Abnormal; Notable for the following:    WBC 11.4 (*)    Neutro Abs 8.0 (*)    Monocytes Absolute 1.2 (*)    All other components within normal limits  BASIC METABOLIC PANEL    Imaging Review Dg Chest 2 View  12/23/2014  CLINICAL DATA:  Wheezing. Cough and congestion. Short of breath and chest pain EXAM: CHEST  2  VIEW COMPARISON:  12/08/2014 FINDINGS: The heart size and mediastinal contours are within normal limits. Both lungs are clear. The visualized skeletal structures are unremarkable. IMPRESSION: No active cardiopulmonary disease. Electronically Signed   By: Marlan Palau M.D.   On: 12/23/2014 10:14   I have personally reviewed and evaluated these images and lab results as part of my medical decision-making.   Patient resting comfortably and stable for discharge MDM   Final diagnoses:  Bronchitis        Nelva Nay, MD 12/23/14 1054

## 2014-12-27 ENCOUNTER — Other Ambulatory Visit: Payer: Self-pay | Admitting: Pulmonary Disease

## 2014-12-28 ENCOUNTER — Telehealth: Payer: Self-pay | Admitting: Pulmonary Disease

## 2014-12-28 NOTE — Telephone Encounter (Signed)
lmtcb for pt.  

## 2014-12-29 NOTE — Telephone Encounter (Signed)
Called and spoke to pt. Pt requesting albuterol neb solution. Rx already called in to [facility] on 12/28/14. Pt verbalized understanding and denied any further questions or concerns at this time.

## 2015-01-09 ENCOUNTER — Other Ambulatory Visit: Payer: Self-pay | Admitting: Pulmonary Disease

## 2015-01-20 ENCOUNTER — Ambulatory Visit: Payer: 59 | Admitting: Internal Medicine

## 2015-02-07 ENCOUNTER — Emergency Department (HOSPITAL_COMMUNITY): Payer: Medicaid Other

## 2015-02-07 ENCOUNTER — Inpatient Hospital Stay (HOSPITAL_COMMUNITY)
Admission: EM | Admit: 2015-02-07 | Discharge: 2015-02-10 | DRG: 202 | Disposition: A | Discharge status: Home / Self Care | Payer: Medicaid Other | Attending: Internal Medicine | Admitting: Internal Medicine

## 2015-02-07 ENCOUNTER — Encounter (HOSPITAL_COMMUNITY): Payer: Self-pay | Admitting: Emergency Medicine

## 2015-02-07 ENCOUNTER — Emergency Department (HOSPITAL_COMMUNITY)
Admission: EM | Admit: 2015-02-07 | Discharge: 2015-02-07 | Disposition: A | Discharge status: Home / Self Care | Payer: Medicaid Other | Source: Home / Self Care | Attending: Emergency Medicine | Admitting: Emergency Medicine

## 2015-02-07 DIAGNOSIS — F17210 Nicotine dependence, cigarettes, uncomplicated: Secondary | ICD-10-CM | POA: Diagnosis present

## 2015-02-07 DIAGNOSIS — Z79899 Other long term (current) drug therapy: Secondary | ICD-10-CM

## 2015-02-07 DIAGNOSIS — Z800 Family history of malignant neoplasm of digestive organs: Secondary | ICD-10-CM | POA: Diagnosis not present

## 2015-02-07 DIAGNOSIS — Z792 Long term (current) use of antibiotics: Secondary | ICD-10-CM | POA: Insufficient documentation

## 2015-02-07 DIAGNOSIS — R6510 Systemic inflammatory response syndrome (SIRS) of non-infectious origin without acute organ dysfunction: Secondary | ICD-10-CM | POA: Diagnosis present

## 2015-02-07 DIAGNOSIS — J45901 Unspecified asthma with (acute) exacerbation: Secondary | ICD-10-CM | POA: Diagnosis not present

## 2015-02-07 DIAGNOSIS — R0602 Shortness of breath: Secondary | ICD-10-CM

## 2015-02-07 DIAGNOSIS — R000 Tachycardia, unspecified: Secondary | ICD-10-CM | POA: Insufficient documentation

## 2015-02-07 DIAGNOSIS — T380X5A Adverse effect of glucocorticoids and synthetic analogues, initial encounter: Secondary | ICD-10-CM | POA: Diagnosis present

## 2015-02-07 DIAGNOSIS — J9601 Acute respiratory failure with hypoxia: Secondary | ICD-10-CM | POA: Diagnosis present

## 2015-02-07 DIAGNOSIS — D72829 Elevated white blood cell count, unspecified: Secondary | ICD-10-CM | POA: Diagnosis present

## 2015-02-07 DIAGNOSIS — Z7951 Long term (current) use of inhaled steroids: Secondary | ICD-10-CM | POA: Insufficient documentation

## 2015-02-07 DIAGNOSIS — Z872 Personal history of diseases of the skin and subcutaneous tissue: Secondary | ICD-10-CM | POA: Insufficient documentation

## 2015-02-07 DIAGNOSIS — Z7952 Long term (current) use of systemic steroids: Secondary | ICD-10-CM

## 2015-02-07 DIAGNOSIS — Z825 Family history of asthma and other chronic lower respiratory diseases: Secondary | ICD-10-CM | POA: Diagnosis not present

## 2015-02-07 DIAGNOSIS — R062 Wheezing: Secondary | ICD-10-CM

## 2015-02-07 DIAGNOSIS — J45909 Unspecified asthma, uncomplicated: Secondary | ICD-10-CM | POA: Diagnosis present

## 2015-02-07 DIAGNOSIS — E876 Hypokalemia: Secondary | ICD-10-CM | POA: Diagnosis present

## 2015-02-07 LAB — I-STAT TROPONIN, ED: Troponin i, poc: 0 ng/mL (ref 0.00–0.08)

## 2015-02-07 LAB — CBC WITH DIFFERENTIAL/PLATELET
BASOS ABS: 0 10*3/uL (ref 0.0–0.1)
BASOS PCT: 0 %
Basophils Absolute: 0 10*3/uL (ref 0.0–0.1)
Basophils Relative: 0 %
EOS ABS: 0 10*3/uL (ref 0.0–0.7)
EOS PCT: 0 %
EOS PCT: 0 %
Eosinophils Absolute: 0.1 10*3/uL (ref 0.0–0.7)
HCT: 43 % (ref 39.0–52.0)
HCT: 43.9 % (ref 39.0–52.0)
Hemoglobin: 14.6 g/dL (ref 13.0–17.0)
Hemoglobin: 14.6 g/dL (ref 13.0–17.0)
LYMPHS ABS: 0.7 10*3/uL (ref 0.7–4.0)
Lymphocytes Relative: 7 %
Lymphocytes Relative: 5 %
Lymphs Abs: 1.3 10*3/uL (ref 0.7–4.0)
MCH: 30.6 pg (ref 26.0–34.0)
MCH: 30.6 pg (ref 26.0–34.0)
MCHC: 34 g/dL (ref 30.0–36.0)
MCHC: 33.3 g/dL (ref 30.0–36.0)
MCV: 90.1 fL (ref 78.0–100.0)
MCV: 92 fL (ref 78.0–100.0)
MONO ABS: 1 10*3/uL (ref 0.1–1.0)
MONO ABS: 1.1 10*3/uL — AB (ref 0.1–1.0)
MONOS PCT: 7 %
Monocytes Relative: 6 %
Neutro Abs: 15.4 10*3/uL — ABNORMAL HIGH (ref 1.7–7.7)
Neutro Abs: 13 10*3/uL — ABNORMAL HIGH (ref 1.7–7.7)
Neutrophils Relative %: 87 %
Neutrophils Relative %: 88 %
PLATELETS: 261 10*3/uL (ref 150–400)
PLATELETS: 290 10*3/uL (ref 150–400)
RBC: 4.77 MIL/uL (ref 4.22–5.81)
RBC: 4.77 MIL/uL (ref 4.22–5.81)
RDW: 14.2 % (ref 11.5–15.5)
RDW: 14.8 % (ref 11.5–15.5)
WBC: 17.8 10*3/uL — ABNORMAL HIGH (ref 4.0–10.5)
WBC: 14.8 10*3/uL — AB (ref 4.0–10.5)

## 2015-02-07 LAB — BLOOD GAS, VENOUS
ACID-BASE DEFICIT: 4.9 mmol/L — AB (ref 0.0–2.0)
BICARBONATE: 18.9 meq/L — AB (ref 20.0–24.0)
O2 SAT: 91.1 %
PH VEN: 7.371 — AB (ref 7.250–7.300)
PO2 VEN: 62.9 mmHg — AB (ref 30.0–45.0)
Patient temperature: 98.6
TCO2: 16.6 mmol/L (ref 0–100)
pCO2, Ven: 33.5 mmHg — ABNORMAL LOW (ref 45.0–50.0)

## 2015-02-07 LAB — BASIC METABOLIC PANEL
ANION GAP: 14 (ref 5–15)
Anion gap: 17 — ABNORMAL HIGH (ref 5–15)
BUN: 14 mg/dL (ref 6–20)
BUN: 14 mg/dL (ref 6–20)
CALCIUM: 8.7 mg/dL — AB (ref 8.9–10.3)
CO2: 21 mmol/L — ABNORMAL LOW (ref 22–32)
CO2: 20 mmol/L — ABNORMAL LOW (ref 22–32)
CREATININE: 1.17 mg/dL (ref 0.61–1.24)
Calcium: 9.2 mg/dL (ref 8.9–10.3)
Chloride: 104 mmol/L (ref 101–111)
Chloride: 105 mmol/L (ref 101–111)
Creatinine, Ser: 1.04 mg/dL (ref 0.61–1.24)
GFR calc Af Amer: >60 mL/min (ref >60)
GFR calc Af Amer: >60 mL/min (ref >60)
GLUCOSE: 125 mg/dL — AB (ref 65–99)
Glucose, Bld: 176 mg/dL — ABNORMAL HIGH (ref 65–99)
Potassium: 3.3 mmol/L — ABNORMAL LOW (ref 3.5–5.1)
Potassium: 3.8 mmol/L (ref 3.5–5.1)
SODIUM: 142 mmol/L (ref 135–145)
Sodium: 139 mmol/L (ref 135–145)

## 2015-02-07 LAB — D-DIMER, QUANTITATIVE (NOT AT ARMC): D DIMER QUANT: 0.31 ug{FEU}/mL (ref 0.00–0.50)

## 2015-02-07 IMAGING — CR DG CHEST 2V
2 series · 2 of 2 positions shown · non-contrast
Comparison: Chest radiograph performed [DATE]

CLINICAL DATA: Acute onset of shortness of breath and wheezing.
Initial encounter.

EXAM:
CHEST  2 VIEW

[w chest pa]
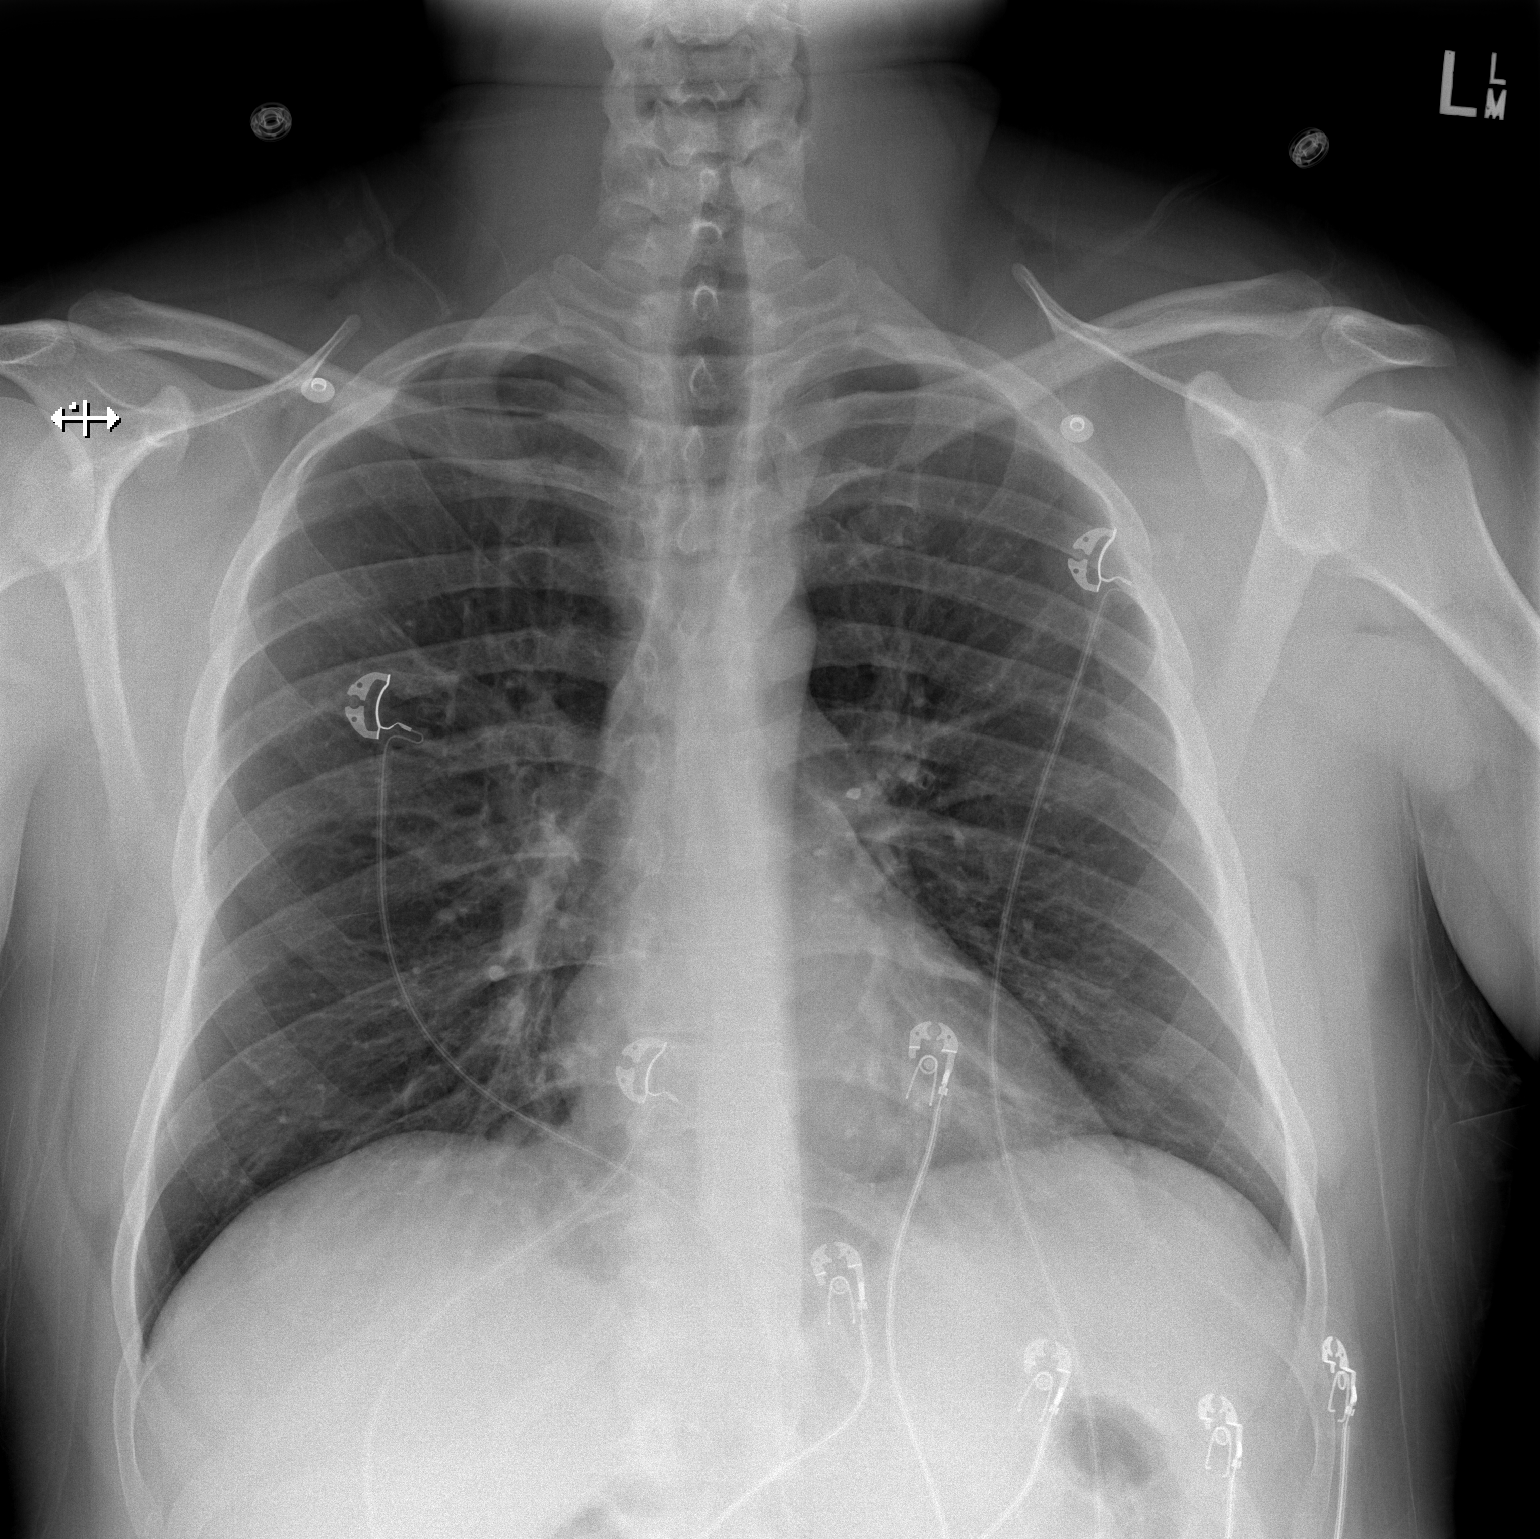

[w chest lat]
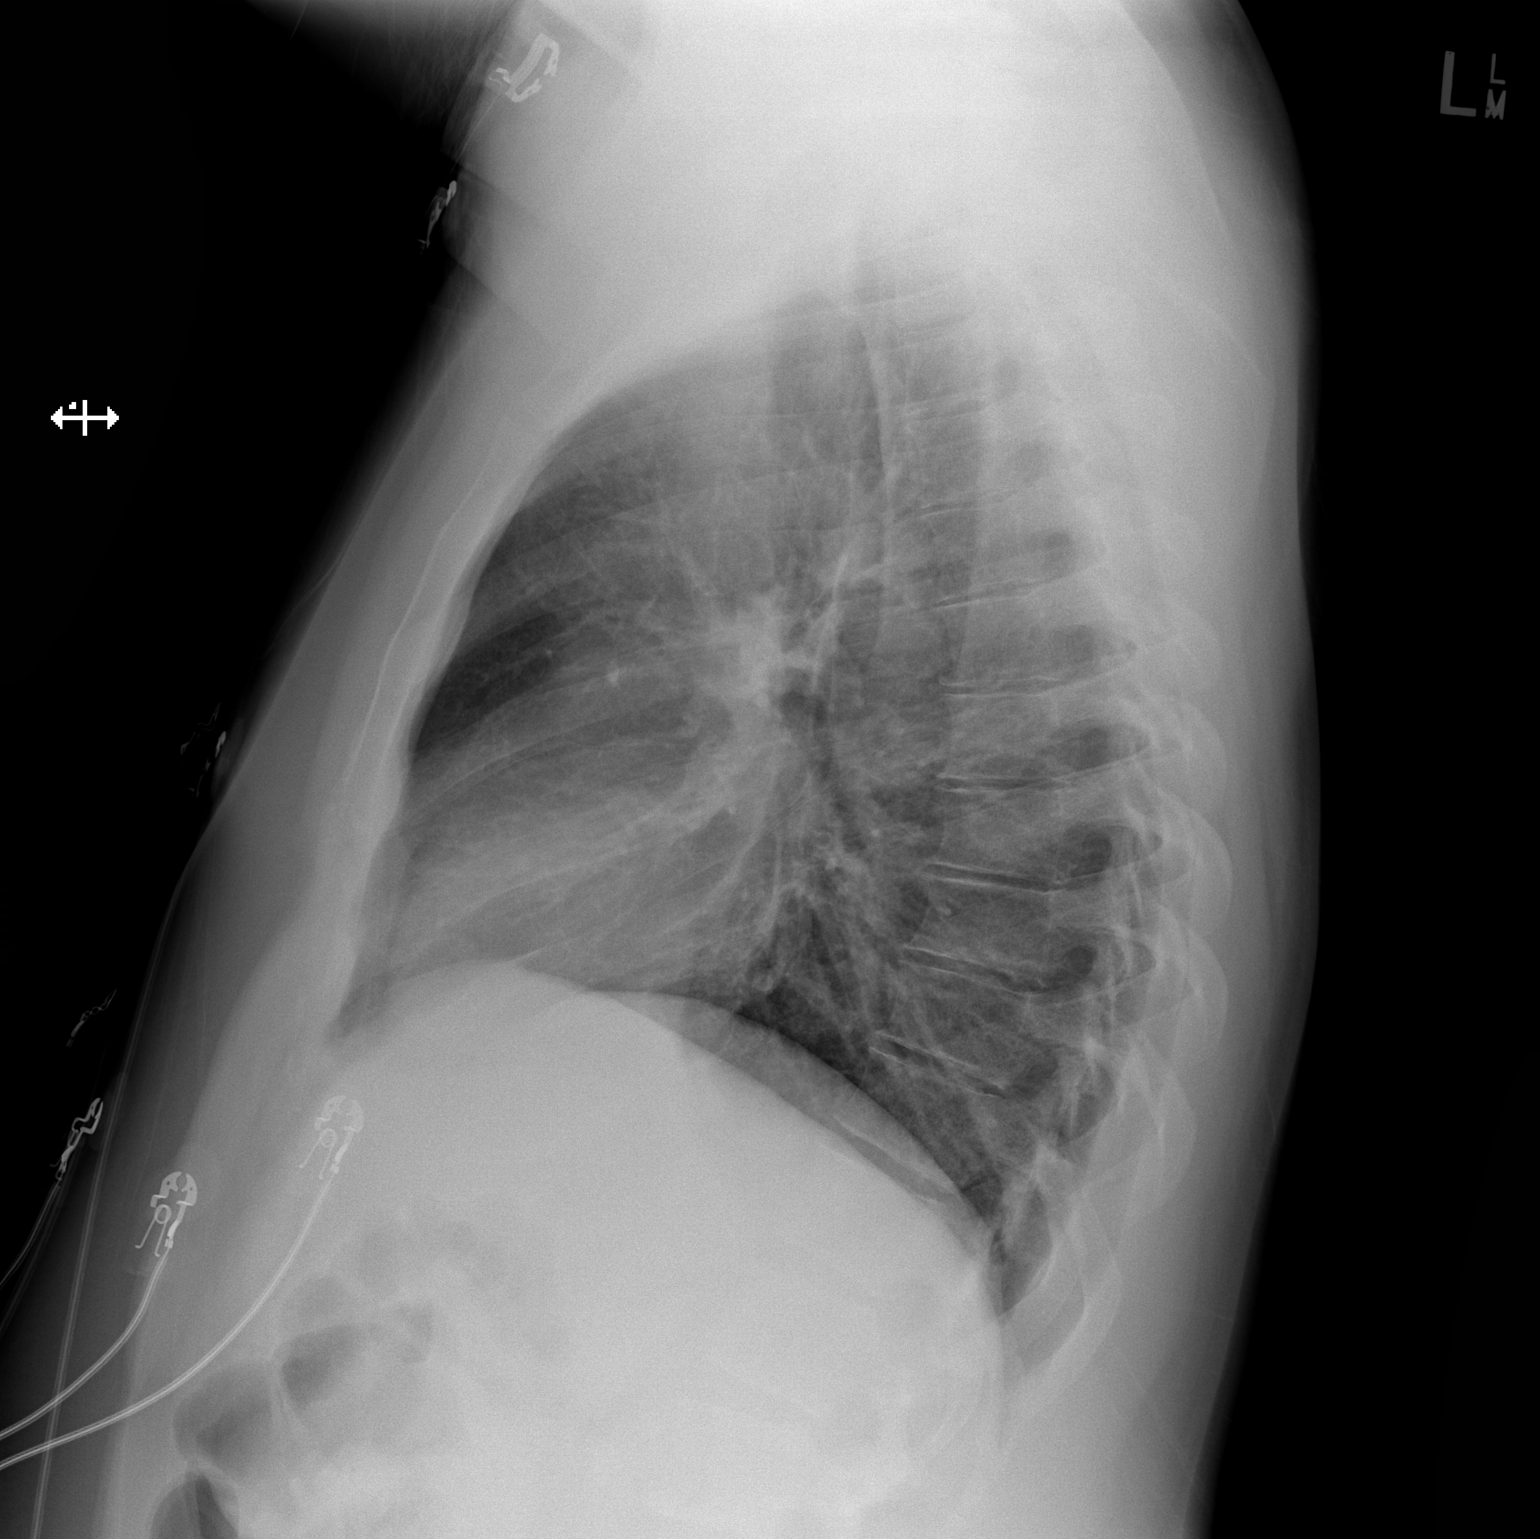

[2 of 2 positions shown; findings below may reference images not displayed]

FINDINGS: The lungs are well-aerated and clear. There is no evidence of focal
opacification, pleural effusion or pneumothorax.

The heart is normal in size; the mediastinal contour is within
normal limits. No acute osseous abnormalities are seen.
IMPRESSION: No acute cardiopulmonary process seen.

## 2015-02-07 MED ORDER — PREDNISONE 20 MG PO TABS: ORAL_TABLET | ORAL | Status: DC

## 2015-02-07 MED ORDER — IPRATROPIUM-ALBUTEROL 0.5-2.5 (3) MG/3ML IN SOLN
3.0000 mL | Freq: Once | RESPIRATORY_TRACT | Status: AC
  Administered 2015-02-07: 3 mL via RESPIRATORY_TRACT
  Filled 2015-02-07: qty 3

## 2015-02-07 MED ORDER — ALBUTEROL SULFATE HFA 108 (90 BASE) MCG/ACT IN AERS
1.0000 | INHALATION_SPRAY | RESPIRATORY_TRACT | Status: DC | PRN
  Filled 2015-02-07: qty 6.7

## 2015-02-07 MED ORDER — ACETAMINOPHEN 650 MG RE SUPP: 650.0000 mg | Freq: Four times a day (QID) | RECTAL | Status: DC | PRN

## 2015-02-07 MED ORDER — ACETAMINOPHEN 325 MG PO TABS: 650.0000 mg | ORAL_TABLET | Freq: Four times a day (QID) | ORAL | Status: DC | PRN

## 2015-02-07 MED ORDER — IPRATROPIUM BROMIDE 0.02 % IN SOLN
0.5000 mg | Freq: Once | RESPIRATORY_TRACT | Status: AC
  Administered 2015-02-07: 0.5 mg via RESPIRATORY_TRACT
  Filled 2015-02-07: qty 2.5

## 2015-02-07 MED ORDER — SODIUM CHLORIDE 0.9 % IV SOLN: INTRAVENOUS | Status: DC

## 2015-02-07 MED ORDER — IPRATROPIUM BROMIDE 0.02 % IN SOLN
0.5000 mg | Freq: Four times a day (QID) | RESPIRATORY_TRACT | Status: DC
  Administered 2015-02-07 – 2015-02-10 (×9): 0.5 mg via RESPIRATORY_TRACT
  Filled 2015-02-07 (×9): qty 2.5

## 2015-02-07 MED ORDER — ALBUTEROL (5 MG/ML) CONTINUOUS INHALATION SOLN
10.0000 mg/h | INHALATION_SOLUTION | Freq: Once | RESPIRATORY_TRACT | Status: AC
  Administered 2015-02-07: 10 mg/h via RESPIRATORY_TRACT
  Filled 2015-02-07: qty 20

## 2015-02-07 MED ORDER — ALBUTEROL (5 MG/ML) CONTINUOUS INHALATION SOLN
10.0000 mg/h | INHALATION_SOLUTION | RESPIRATORY_TRACT | Status: DC
  Administered 2015-02-07: 10 mg/h via RESPIRATORY_TRACT
  Filled 2015-02-07: qty 20

## 2015-02-07 MED ORDER — MAGNESIUM SULFATE 2 GM/50ML IV SOLN
2.0000 g | Freq: Once | INTRAVENOUS | Status: AC
  Administered 2015-02-07: 2 g via INTRAVENOUS
  Filled 2015-02-07: qty 50

## 2015-02-07 MED ORDER — METHYLPREDNISOLONE SODIUM SUCC 125 MG IJ SOLR
125.0000 mg | Freq: Once | INTRAMUSCULAR | Status: AC
  Administered 2015-02-07: 125 mg via INTRAVENOUS
  Filled 2015-02-07: qty 2

## 2015-02-07 MED ORDER — SODIUM CHLORIDE 0.9 % IJ SOLN
3.0000 mL | Freq: Two times a day (BID) | INTRAMUSCULAR | Status: DC
  Administered 2015-02-07 – 2015-02-09 (×3): 3 mL via INTRAVENOUS

## 2015-02-07 MED ORDER — ALBUTEROL SULFATE (2.5 MG/3ML) 0.083% IN NEBU
5.0000 mg | INHALATION_SOLUTION | Freq: Once | RESPIRATORY_TRACT | Status: AC
  Administered 2015-02-07: 5 mg via RESPIRATORY_TRACT
  Filled 2015-02-07: qty 6

## 2015-02-07 MED ORDER — LEVALBUTEROL HCL 0.63 MG/3ML IN NEBU
0.6300 mg | INHALATION_SOLUTION | Freq: Four times a day (QID) | RESPIRATORY_TRACT | Status: DC
  Administered 2015-02-07 – 2015-02-10 (×10): 0.63 mg via RESPIRATORY_TRACT
  Filled 2015-02-07 (×9): qty 3

## 2015-02-07 MED ORDER — SODIUM CHLORIDE 0.9 % IV BOLUS (SEPSIS)
1000.0000 mL | Freq: Once | INTRAVENOUS | Status: AC
  Administered 2015-02-07: 1000 mL via INTRAVENOUS

## 2015-02-07 MED ORDER — ONDANSETRON HCL 4 MG PO TABS: 4.0000 mg | ORAL_TABLET | Freq: Four times a day (QID) | ORAL | Status: DC | PRN

## 2015-02-07 MED ORDER — ONDANSETRON HCL 4 MG/2ML IJ SOLN: 4.0000 mg | Freq: Four times a day (QID) | INTRAMUSCULAR | Status: DC | PRN

## 2015-02-07 MED ORDER — METHYLPREDNISOLONE SODIUM SUCC 125 MG IJ SOLR
60.0000 mg | Freq: Two times a day (BID) | INTRAMUSCULAR | Status: DC
  Administered 2015-02-08: 60 mg via INTRAVENOUS
  Filled 2015-02-07 (×2): qty 0.96

## 2015-02-07 MED ORDER — MAGNESIUM SULFATE 50 % IJ SOLN: 2.0000 g | Freq: Once | INTRAMUSCULAR | Status: DC

## 2015-02-07 MED ORDER — IPRATROPIUM BROMIDE 0.02 % IN SOLN
0.5000 mg | Freq: Four times a day (QID) | RESPIRATORY_TRACT | Status: DC | PRN
  Administered 2015-02-08 (×2): 0.5 mg via RESPIRATORY_TRACT
  Filled 2015-02-07 (×2): qty 2.5

## 2015-02-07 MED ORDER — ALBUTEROL SULFATE HFA 108 (90 BASE) MCG/ACT IN AERS: 1.0000 | INHALATION_SPRAY | RESPIRATORY_TRACT | Status: DC | PRN

## 2015-02-07 MED ORDER — GUAIFENESIN-DM 100-10 MG/5ML PO SYRP
5.0000 mL | ORAL_SOLUTION | ORAL | Status: DC | PRN
  Administered 2015-02-07 – 2015-02-08 (×4): 5 mL via ORAL
  Filled 2015-02-07 (×4): qty 10

## 2015-02-07 MED ORDER — LEVALBUTEROL HCL 0.63 MG/3ML IN NEBU
0.6300 mg | INHALATION_SOLUTION | Freq: Four times a day (QID) | RESPIRATORY_TRACT | Status: DC | PRN
  Administered 2015-02-08: 0.63 mg via RESPIRATORY_TRACT
  Filled 2015-02-07 (×2): qty 3

## 2015-02-07 NOTE — ED Provider Notes (Signed)
CSN: [id number]     Arrival date & time 02/07/15  0155 History   First MD Initiated Contact with Patient 02/07/15 0210     Chief Complaint  Patient presents with  . Asthma     (Consider location/radiation/quality/duration/timing/severity/associated sxs/prior Treatment) HPI Comments: NOMAR BROAD is a 29 y.o. male with a PMHx of asthma and psoriasis, who presents to the ED with complaints of asthma attack. Patient states around 9 PM he was playing video games and begin wheezing, felt short of breath, and developed a dry cough. He states this is all consistent with his prior asthma attacks. He was given 125mg  solumedrol, 2gm Mg, and 2 duonebs which have helped somewhat with his symptoms but he continues to have wheezing. No known aggravating factors.  He denies any fevers, chills, chest pain, leg swelling, recent travel/surgery/immobilization, history of DVT/PE, rhinorrhea, sore throat, ear pain or drainage, abdominal pain, nausea, vomiting, diarrhea, constipation, dysuria, hematuria, numbness, tingling, weakness, prior intubations, or prior admissions for asthma. He endorses smoking e-cigarettes.    Patient is a 29 y.o. male presenting with asthma. The history is provided by the patient. No language interpreter was used.  Asthma This is a recurrent problem. The current episode started today (9pm last night). The problem occurs constantly. The problem has been unchanged. Associated symptoms include coughing (after onset of asthma exacerbation). Pertinent negatives include no abdominal pain, arthralgias, chest pain, chills, fever, myalgias, nausea, numbness, sore throat, urinary symptoms, vomiting or weakness. Nothing aggravates the symptoms. Treatments tried: 125mg  solumedrol, 2gm Mg, 2 duonebs. The treatment provided moderate relief.    Past Medical History  Diagnosis Date  . Asthma   . Psoriasis    Past Surgical History  Procedure Laterality Date  . Mandible surgery    . Wisdom tooth  extraction     Family History  Problem Relation Age of Onset  . Cancer Maternal Grandmother     colon  . Asthma Sister    Social History  Substance Use Topics  . Smoking status: Current Every Day Smoker -- 0.50 packs/day for 8 years    Types: Cigarettes  . Smokeless tobacco: Never Used     Comment: TRYING TO QUIT-- USING E-CIG (LIMITED CIGARETTES)  . Alcohol Use: No    Review of Systems  Constitutional: Negative for fever and chills.  HENT: Negative for ear discharge, ear pain, rhinorrhea and sore throat.   Respiratory: Positive for cough (after onset of asthma exacerbation), shortness of breath and wheezing.   Cardiovascular: Negative for chest pain and leg swelling.  Gastrointestinal: Negative for nausea, vomiting, abdominal pain, diarrhea and constipation.  Genitourinary: Negative for dysuria and hematuria.  Musculoskeletal: Negative for myalgias and arthralgias.  Skin: Negative for color change.  Allergic/Immunologic: Negative for immunocompromised state.  Neurological: Negative for weakness and numbness.  Psychiatric/Behavioral: Negative for confusion.   10 Systems reviewed and are negative for acute change except as noted in the HPI.    Allergies  Review of patient's allergies indicates no known allergies.  Home Medications   Prior to Admission medications   Medication Sig Start Date End Date Taking? Authorizing Provider  albuterol (PROVENTIL HFA;VENTOLIN HFA) 108 (90 BASE) MCG/ACT inhaler Inhale 2 puffs into the lungs every 4 (four) hours as needed. For asthma/shortness of breath 01/05/14   Coralyn Helling, MD  albuterol (PROVENTIL) (2.5 MG/3ML) 0.083% nebulizer solution USE ONE VIAL IN NEBULIZER EVERY 4 HOURS AS NEEDED FOR WHEEZING 12/28/14   Coralyn Helling, MD  brompheniramine-pseudoephedrine-DM 30-2-10 MG/5ML syrup Take  5 mLs by mouth every 8 (eight) hours as needed (cough).    Historical Provider, MD  budesonide-formoterol (SYMBICORT) 160-4.5 MCG/ACT inhaler Inhale 2  puffs into the lungs 2 (two) times daily. 12/08/14   Tammy S Parrett, NP  doxycycline (VIBRAMYCIN) 100 MG capsule Take 1 capsule (100 mg total) by mouth 2 (two) times daily. 12/23/14   Nelva Nay, MD  HYDROcodone-acetaminophen (NORCO/VICODIN) 5-325 MG tablet Take 1 tablet by mouth every 6 (six) hours as needed for moderate pain.    Historical Provider, MD  OVER THE COUNTER MEDICATION Take 1 Dose by mouth every 4 (four) hours as needed (cold symptoms). Dollar store liquid cold medicine    Historical Provider, MD   BP 117/77 mmHg  Pulse 120  Temp(Src) 98 F (36.7 C) (Oral)  Resp 22  SpO2 93% Physical Exam  Constitutional: He is oriented to person, place, and time. Vital signs are normal. He appears well-developed and well-nourished.  Non-toxic appearance. No distress.  Afebrile, nontoxic, NAD  HENT:  Head: Normocephalic and atraumatic.  Mouth/Throat: Oropharynx is clear and moist and mucous membranes are normal.  Eyes: Conjunctivae and EOM are normal. Right eye exhibits no discharge. Left eye exhibits no discharge.  Neck: Normal range of motion. Neck supple.  Cardiovascular: Regular rhythm, normal heart sounds and intact distal pulses.  Tachycardia present.  Exam reveals no gallop and no friction rub.   No murmur heard. Tachycardic likely from prior duonebs Regular rhythm, nl s1/s2, no m/r/g, distal pulses intact, no pedal edema   Pulmonary/Chest: He is in respiratory distress (minimally increased WOB). He has decreased breath sounds in the right lower field and the left lower field. He has wheezes. He has no rhonchi. He has no rales.  Slightly increased WOB, speaking in slightly shortened sentences due to difficulty breathing, diffuse expiratory wheezing, no rhonchi or rales, prolonged expiratory phase with some diminished sounds in the lower lung fields, no hypoxia but SpO2 89-93% on RA during exam  Abdominal: Soft. Normal appearance and bowel sounds are normal. He exhibits no distension.  There is no tenderness. There is no rigidity, no rebound, no guarding and no CVA tenderness.  Musculoskeletal: Normal range of motion.  MAE x4 Strength and sensation grossly intact Distal pulses intact No pedal edema, neg homan's bilaterally   Neurological: He is alert and oriented to person, place, and time. He has normal strength. No sensory deficit.  Skin: Skin is warm, dry and intact. No rash noted.  Psychiatric: He has a normal mood and affect.  Nursing note and vitals reviewed.   ED Course  Procedures (including critical care time) Labs Review Labs Reviewed  CBC WITH DIFFERENTIAL/PLATELET - Abnormal; Notable for the following:    WBC 17.8 (*)    Neutro Abs 15.4 (*)    All other components within normal limits  BASIC METABOLIC PANEL - Abnormal; Notable for the following:    Potassium 3.3 (*)    CO2 21 (*)    Glucose, Bld 125 (*)    Calcium 8.7 (*)    All other components within normal limits  I-STAT TROPOININ, ED    Imaging Review Dg Chest 2 View  02/07/2015  CLINICAL DATA:  Acute onset of shortness of breath and wheezing. Initial encounter. EXAM: CHEST  2 VIEW COMPARISON:  Chest radiograph performed 12/23/2014 FINDINGS: The lungs are well-aerated and clear. There is no evidence of focal opacification, pleural effusion or pneumothorax. The heart is normal in size; the mediastinal contour is within normal limits. No  acute osseous abnormalities are seen. IMPRESSION: No acute cardiopulmonary process seen. Electronically Signed   By: Roanna RaiderJeffery  Chang M.D.   On: 02/07/2015 03:22   I have personally reviewed and evaluated these images and lab results as part of my medical decision-making.   EKG Interpretation   Date/Time:  Sunday February 07 2015 02:37:11 EST Ventricular Rate:  116 PR Interval:  92 QRS Duration: 90 QT Interval:  340 QTC Calculation: 472 R Axis:   67 Text Interpretation:  Sinus tachycardia Borderline repolarization  abnormality No old tracing to compare  Confirmed by Erroll Lunani, Adeleke Ayokunle  [phone]([id number]) on 02/07/2015 4:10:36 AM      MDM   Final diagnoses:  Asthma exacerbation  Wheezing  SOB (shortness of breath)    29 y.o. male here with asthma attack since 9pm. Pt states he was playing video games and began wheezing, then began coughing and had SOB. Denies CP, no LE swelling, no recent travel/surgery/immobilizations, doubt PE. +Smoker of e-cigarettes. No prior intubations or admissions. On exam, diffuse expiratory wheezing noted, no rales/rhonchi, with SpO2 89-93% on RA, speaking in somewhat shortened sentences. Will repeat duoneb, if unimproved will try CAT. May consider another round of Mg. Will get labs and EKG/CXR in case we need to admit. Will reassess shortly.   3:37 AM Pt's lung sounds greatly improved after nebs, pt feels much better. Still with some wheezing in the upper airways, will give CAT and one more round of Mg then attempt to ambulate. Pt wants to avoid admission, and given how much he improved after the last nebulizer, I feel he may end up being able to go home. Will reassess shortly. Of note, CXR clear. EKG with sinus tachy and borderline long QT but no ischemic changes. Awaiting labs.   5:49 AM Trop neg, CBC w/diff showing leukocytosis of 17.8 likely from demargination after steroids. BMP with mildly low K at 3.3 likely from albuterol use. Pt's lung sounds greatly improved, no ongoing wheezing. SpO2 up to 94-95% at rest, maintained >90% during ambulation. Discussed that I feel he is safe to go home but very strict return precautions given. Discussed starting prednisone tomorrow x4 days. Antihistamine use discussed. Inhaler sent down from pharmacy before he leaves, and rx for one given. F/up with PCP in 3-5 days. I explained the diagnosis and have given explicit precautions to return to the ER including for any other new or worsening symptoms. The patient understands and accepts the medical plan as it's been dictated and I have  answered their questions. Discharge instructions concerning home care and prescriptions have been given. The patient is STABLE and is discharged to home in good condition.   BP 123/67 mmHg  Pulse 124  Temp(Src) 98 F (36.7 C) (Oral)  Resp 16  SpO2 90%  Meds ordered this encounter  Medications  . albuterol (PROVENTIL) (2.5 MG/3ML) 0.083% nebulizer solution 5 mg    Sig:   . ipratropium (ATROVENT) nebulizer solution 0.5 mg    Sig:   . albuterol (PROVENTIL,VENTOLIN) solution continuous neb    Sig:   . magnesium sulfate IVPB 2 g 50 mL    Sig:   . albuterol (PROVENTIL HFA;VENTOLIN HFA) 108 (90 BASE) MCG/ACT inhaler 1-2 puff    Sig:   . albuterol (PROVENTIL HFA;VENTOLIN HFA) 108 (90 BASE) MCG/ACT inhaler    Sig: Inhale 1-2 puffs into the lungs every 4 (four) hours as needed for wheezing or shortness of breath (cough).    Dispense:  1 Inhaler  Refill:  0    Order Specific Question:  Supervising Provider    Answer:  Hyacinth Meeker, BRIAN [[id number]]  . predniSONE (DELTASONE) 20 MG tablet    Sig: 3 tabs po daily x 4 days    Dispense:  12 tablet    Refill:  0    Order Specific Question:  Supervising Provider    Answer:  Eber Hong [[id number]]     Latasha Puskas Camprubi-Soms, PA-C 02/07/15 1610  Tomasita Crumble, MD 02/07/15 [phone]

## 2015-02-07 NOTE — ED Notes (Addendum)
Per EMS, pt. From home with complaint of asthma which started at 9pm last night , given self a breathing treatment without relief. Received 2 doses of breathing treatment per EMS, 125mg  IV solumedrol and 2gms of Mag sulfate? Marland Kitchen. Alert and oriented , denied chest pain.

## 2015-02-07 NOTE — Discharge Instructions (Signed)
Continue to stay well-hydrated. Alternate between Tylenol and Ibuprofen for pain or fever. Use Mucinex for cough suppression/expectoration of mucus. Use over the counter antihistamines such as claritin or zyrtec daily to help with allergies/asthma. Take prednisone as directed. Use inhaler as directed, as needed for cough/chest congestion. Followup with your primary care doctor in 3-5 days for recheck of ongoing symptoms. Return to emergency department for emergent changing or worsening of symptoms.   Asthma, Acute Bronchospasm Acute bronchospasm caused by asthma is also referred to as an asthma attack. Bronchospasm means your air passages become narrowed. The narrowing is caused by inflammation and tightening of the muscles in the air tubes (bronchi) in your lungs. This can make it hard to breathe or cause you to wheeze and cough. CAUSES Possible triggers are:  Animal dander from the skin, hair, or feathers of animals.  Dust mites contained in house dust.  Cockroaches.  Pollen from trees or grass.  Mold.  Cigarette or tobacco smoke.  Air pollutants such as dust, household cleaners, hair sprays, aerosol sprays, paint fumes, strong chemicals, or strong odors.  Cold air or weather changes. Cold air may trigger inflammation. Winds increase molds and pollens in the air.  Strong emotions such as crying or laughing hard.  Stress.  Certain medicines such as aspirin or beta-blockers.  Sulfites in foods and drinks, such as dried fruits and wine.  Infections or inflammatory conditions, such as a flu, cold, or inflammation of the nasal membranes (rhinitis).  Gastroesophageal reflux disease (GERD). GERD is a condition where stomach acid backs up into your esophagus.  Exercise or strenuous activity. SIGNS AND SYMPTOMS   Wheezing.  Excessive coughing, particularly at night.  Chest tightness.  Shortness of breath. DIAGNOSIS  Your health care provider will ask you about your medical  history and perform a physical exam. A chest X-ray or blood testing may be performed to look for other causes of your symptoms or other conditions that may have triggered your asthma attack. TREATMENT  Treatment is aimed at reducing inflammation and opening up the airways in your lungs. Most asthma attacks are treated with inhaled medicines. These include quick relief or rescue medicines (such as bronchodilators) and controller medicines (such as inhaled corticosteroids). These medicines are sometimes given through an inhaler or a nebulizer. Systemic steroid medicine taken by mouth or given through an IV tube also can be used to reduce the inflammation when an attack is moderate or severe. Antibiotic medicines are only used if a bacterial infection is present.  HOME CARE INSTRUCTIONS   Rest.  Drink plenty of liquids. This helps the mucus to remain thin and be easily coughed up. Only use caffeine in moderation and do not use alcohol until you have recovered from your illness.  Do not smoke. Avoid being exposed to secondhand smoke.  You play a critical role in keeping yourself in good health. Avoid exposure to things that cause you to wheeze or to have breathing problems.  Keep your medicines up-to-date and available. Carefully follow your health care provider's treatment plan.  Take your medicine exactly as prescribed.  When pollen or pollution is bad, keep windows closed and use an air conditioner or go to places with air conditioning.  Asthma requires careful medical care. See your health care provider for a follow-up as advised. If you are more than [redacted] weeks pregnant and you were prescribed any new medicines, let your obstetrician know about the visit and how you are doing. Follow up with your health  care provider as directed.  After you have recovered from your asthma attack, make an appointment with your outpatient doctor to talk about ways to reduce the likelihood of future attacks. If  you do not have a doctor who manages your asthma, make an appointment with a primary care doctor to discuss your asthma. SEEK IMMEDIATE MEDICAL CARE IF:   You are getting worse.  You have trouble breathing. If severe, call your local emergency services (911 in the [place]  You develop chest pain or discomfort.  You are vomiting.  You are not able to keep fluids down.  You are coughing up yellow, green, brown, or bloody sputum.  You have a fever and your symptoms suddenly get worse.  You have trouble swallowing. MAKE SURE YOU:   Understand these instructions.  Will watch your condition.  Will get help right away if you are not doing well or get worse.   This information is not intended to replace advice given to you by your health care provider. Make sure you discuss any questions you have with your health care provider.   Document Released: 05/17/2006 Document Revised: 02/04/2013 Document Reviewed: 08/07/2012 Elsevier Interactive Patient Education 2016 [employer]  Metered Dose Inhaler (No Spacer Used) Inhaled medicines are the basis of treatment for asthma and other breathing problems. Inhaled medicine can only be effective if used properly. Good technique assures that the medicine reaches the lungs. Metered dose inhalers (MDIs) are used to deliver a variety of inhaled medicines. These include quick relief or rescue medicines (such as bronchodilators) and controller medicines (such as corticosteroids). The medicine is delivered by pushing down on a metal canister to release a set amount of spray. If you are using different kinds of inhalers, use your quick relief medicine to open the airways 10-15 minutes before using a steroid, if instructed to do so by your health care provider. If you are unsure which inhalers to use and the order of using them, ask your health care provider, nurse, or respiratory therapist. HOW TO USE THE INHALER  Remove the cap from the inhaler.  If you  are using the inhaler for the first time, you will need to prime it. Shake the inhaler for 5 seconds and release four puffs into the air, away from your face. Ask your health care provider or pharmacist if you have questions about priming your inhaler.  Shake the inhaler for 5 seconds before each breath in (inhalation).  Position the inhaler so that the top of the canister faces up.  Put your index finger on the top of the medicine canister. Your thumb supports the bottom of the inhaler.  Open your mouth.  Either place the inhaler between your teeth and place your lips tightly around the mouthpiece, or hold the inhaler 1-2 inches away from your open mouth. If you are unsure of which technique to use, ask your health care provider.  Breathe out (exhale) normally and as completely as possible.  Press the canister down with the index finger to release the medicine.  At the same time as the canister is pressed, inhale deeply and slowly until your lungs are completely filled. This should take 4-6 seconds. Keep your tongue down.  Hold the medicine in your lungs for 5-10 seconds (10 seconds is best). This helps the medicine get into the small airways of your lungs.  Breathe out slowly, through pursed lips. Whistling is an example of pursed lips.  Wait at least 1 minute between puffs. Continue  with the above steps until you have taken the number of puffs your health care provider has ordered. Do not use the inhaler more than your health care provider directs you to.  Replace the cap on the inhaler.  Follow the directions from your health care provider or the inhaler insert for cleaning the inhaler. If you are using a steroid inhaler, after your last puff, rinse your mouth with water, gargle, and spit out the water. Do not swallow the water. AVOID:  Inhaling before or after starting the spray of medicine. It takes practice to coordinate your breathing with triggering the spray.  Inhaling  through the nose (rather than the mouth) when triggering the spray. HOW TO DETERMINE IF YOUR INHALER IS FULL OR NEARLY EMPTY You cannot know when an inhaler is empty by shaking it. Some inhalers are now being made with dose counters. Ask your health care provider for a prescription that has a dose counter if you feel you need that extra help. If your inhaler does not have a counter, ask your health care provider to help you determine the date you need to refill your inhaler. Write the refill date on a calendar or your inhaler canister. Refill your inhaler 7-10 days before it runs out. Be sure to keep an adequate supply of medicine. This includes making sure it has not expired, and making sure you have a spare inhaler. SEEK MEDICAL CARE IF:  Symptoms are only partially relieved with your inhaler.  You are having trouble using your inhaler.  You experience an increase in phlegm. SEEK IMMEDIATE MEDICAL CARE IF:  You feel little or no relief with your inhalers. You are still wheezing and feeling shortness of breath, tightness in your chest, or both.  You have dizziness, headaches, or a fast heart rate.  You have chills, fever, or night sweats.  There is a noticeable increase in phlegm production, or there is blood in the phlegm. MAKE SURE YOU:  Understand these instructions.  Will watch your condition.  Will get help right away if you are not doing well or get worse.   This information is not intended to replace advice given to you by your health care provider. Make sure you discuss any questions you have with your health care provider.   Document Released: 11/27/2006 Document Revised: 02/20/2014 Document Reviewed: 07/18/2012 Elsevier Interactive Patient Education 2016 [employer].  Shortness of Breath Shortness of breath means you have trouble breathing. It could also mean that you have a medical problem. You should get immediate medical care for shortness of breath. CAUSES   Not  enough oxygen in the air such as with high altitudes or a smoke-filled room.  Certain lung diseases, infections, or problems.  Heart disease or conditions, such as angina or heart failure.  Low red blood cells (anemia).  Poor physical fitness, which can cause shortness of breath when you exercise.  Chest or back injuries or stiffness.  Being overweight.  Smoking.  Anxiety, which can make you feel like you are not getting enough air. DIAGNOSIS  Serious medical problems can often be found during your physical exam. Tests may also be done to determine why you are having shortness of breath. Tests may include:  Chest X-rays.  Lung function tests.  Blood tests.  An electrocardiogram (ECG).  An ambulatory electrocardiogram. An ambulatory ECG records your heartbeat patterns over a 24-hour period.  Exercise testing.  A transthoracic echocardiogram (TTE). During echocardiography, sound waves are used to evaluate how blood  flows through your heart.  A transesophageal echocardiogram (TEE).  Imaging scans. Your health care provider may not be able to find a cause for your shortness of breath after your exam. In this case, it is important to have a follow-up exam with your health care provider as directed.  TREATMENT  Treatment for shortness of breath depends on the cause of your symptoms and can vary greatly. HOME CARE INSTRUCTIONS   Do not smoke. Smoking is a common cause of shortness of breath. If you smoke, ask for help to quit.  Avoid being around chemicals or things that may bother your breathing, such as paint fumes and dust.  Rest as needed. Slowly resume your usual activities.  If medicines were prescribed, take them as directed for the full length of time directed. This includes oxygen and any inhaled medicines.  Keep all follow-up appointments as directed by your health care provider. SEEK MEDICAL CARE IF:   Your condition does not improve in the time  expected.  You have a hard time doing your normal activities even with rest.  You have any new symptoms. SEEK IMMEDIATE MEDICAL CARE IF:   Your shortness of breath gets worse.  You feel light-headed, faint, or develop a cough not controlled with medicines.  You start coughing up blood.  You have pain with breathing.  You have chest pain or pain in your arms, shoulders, or abdomen.  You have a fever.  You are unable to walk up stairs or exercise the way you normally do. MAKE SURE YOU:  Understand these instructions.  Will watch your condition.  Will get help right away if you are not doing well or get worse.   This information is not intended to replace advice given to you by your health care provider. Make sure you discuss any questions you have with your health care provider.   Document Released: 10/25/2000 Document Revised: 02/04/2013 Document Reviewed: 04/17/2011 Elsevier Interactive Patient Education [employer]

## 2015-02-07 NOTE — ED Provider Notes (Signed)
CSN: [id number]     Arrival date & time 02/07/15  1259 History   First MD Initiated Contact with Patient 02/07/15 1306     Chief Complaint  Patient presents with  . Asthma   (Consider location/radiation/quality/duration/timing/severity/associated sxs/prior Treatment) The history is provided by the patient. No language interpreter was used.   Nicholas Brooks is a 29 y.o male with a history of asthma and psoriasis presents to the ED complaining of an asthma attack. He was seen earlier today in the ED due to shortness of breath, wheezing, and a dry cough that he developed the night before. He states that it is consistent with his previous asthma attacks. He was given Solu-Medrol, magnesium, and 22 nebs which helped somewhat with his symptoms. He states that he normally takes an albuterol nebulizer and rescue inhaler at home. He states he was admitted for asthma when he was young but nothing in the past 5 years. He states that he went home earlier today and did not feel better. He also reports that he is short of breath with any movement or ambulation. He smokes cigarettes. Past Medical History  Diagnosis Date  . Asthma   . Psoriasis    Past Surgical History  Procedure Laterality Date  . Mandible surgery    . Wisdom tooth extraction     Family History  Problem Relation Age of Onset  . Cancer Maternal Grandmother     colon  . Asthma Sister    Social History  Substance Use Topics  . Smoking status: Current Every Day Smoker -- 0.50 packs/day for 8 years    Types: Cigarettes  . Smokeless tobacco: Never Used     Comment: TRYING TO QUIT-- USING E-CIG (LIMITED CIGARETTES)  . Alcohol Use: No    Review of Systems  Constitutional: Negative for fever.  Respiratory: Positive for shortness of breath. Negative for wheezing.   Cardiovascular: Negative for chest pain.  All other systems reviewed and are negative.     Allergies  Review of patient's allergies indicates no known  allergies.  Home Medications   Prior to Admission medications   Medication Sig Start Date End Date Taking? Authorizing Provider  albuterol (PROVENTIL HFA;VENTOLIN HFA) 108 (90 BASE) MCG/ACT inhaler Inhale 1-2 puffs into the lungs every 4 (four) hours as needed for wheezing or shortness of breath (cough). 02/07/15  Yes Mercedes Camprubi-Soms, PA-C  albuterol (PROVENTIL) (2.5 MG/3ML) 0.083% nebulizer solution USE ONE VIAL IN NEBULIZER EVERY 4 HOURS AS NEEDED FOR WHEEZING 12/28/14  Yes Coralyn Helling, MD  budesonide-formoterol (SYMBICORT) 160-4.5 MCG/ACT inhaler Inhale 2 puffs into the lungs 2 (two) times daily. Patient not taking: Reported on 02/07/2015 12/08/14   Virgel Bouquet Parrett, NP  doxycycline (VIBRAMYCIN) 100 MG capsule Take 1 capsule (100 mg total) by mouth 2 (two) times daily. Patient not taking: Reported on 02/07/2015 12/23/14   Nelva Nay, MD  predniSONE (DELTASONE) 20 MG tablet 3 tabs po daily x 4 days 02/07/15   Mercedes Camprubi-Soms, PA-C   BP 130/68 mmHg  Pulse 139  Temp(Src) 98.4 F (36.9 C) (Oral)  Resp 21  SpO2 92% Physical Exam  Constitutional: He is oriented to person, place, and time. He appears well-developed and well-nourished. No distress.  HENT:  Head: Normocephalic and atraumatic.  Eyes: Conjunctivae are normal.  Neck: Normal range of motion. Neck supple.  Cardiovascular: Regular rhythm and normal heart sounds.  Tachycardia present.   Pulmonary/Chest: Effort normal. No accessory muscle usage. He has decreased breath sounds. He has no  wheezes. He has no rales.  Decreased air movement bilaterally. No wheezing or crackles. Tachypnea. 90% oxygen on 2 L.  Abdominal: Soft. There is no tenderness.  Musculoskeletal: Normal range of motion.  Neurological: He is alert and oriented to person, place, and time.  Skin: Skin is warm and dry.  Nursing note and vitals reviewed.   ED Course  Procedures (including critical care time) Labs Review Labs Reviewed  BLOOD GAS,  VENOUS - Abnormal; Notable for the following:    pH, Ven 7.371 (*)    pCO2, Ven 33.5 (*)    pO2, Ven 62.9 (*)    Bicarbonate 18.9 (*)    Acid-base deficit 4.9 (*)    All other components within normal limits  CBC WITH DIFFERENTIAL/PLATELET - Abnormal; Notable for the following:    WBC 14.8 (*)    Neutro Abs 13.0 (*)    Monocytes Absolute 1.1 (*)    All other components within normal limits  BASIC METABOLIC PANEL - Abnormal; Notable for the following:    CO2 20 (*)    Glucose, Bld 176 (*)    Anion gap 17 (*)    All other components within normal limits  D-DIMER, QUANTITATIVE (NOT AT [facility])    Imaging Review Dg Chest 2 View  02/07/2015  CLINICAL DATA:  Acute onset of shortness of breath and wheezing. Initial encounter. EXAM: CHEST  2 VIEW COMPARISON:  Chest radiograph performed 12/23/2014 FINDINGS: The lungs are well-aerated and clear. There is no evidence of focal opacification, pleural effusion or pneumothorax. The heart is normal in size; the mediastinal contour is within normal limits. No acute osseous abnormalities are seen. IMPRESSION: No acute cardiopulmonary process seen. Electronically Signed   By: Roanna RaiderJeffery  Chang M.D.   On: 02/07/2015 03:22   I have personally reviewed and evaluated these images and lab results as part of my medical decision-making.   EKG Interpretation   Date/Time:  Sunday February 07 2015 13:11:48 EST Ventricular Rate:  143 PR Interval:  81 QRS Duration: 75 QT Interval:  361 QTC Calculation: 557 R Axis:   75 Text Interpretation:  Sinus tachycardia RSR' in V1 or V2, probably normal  variant Nonspecific repol abnormality, inferior leads Prolonged QT  interval no acute ischemia  Sinus tachycardia Confirmed by Kandis MannanMACKUEN,  COURTNEY ([id number]) on 02/07/2015 1:30:50 PM     CRITICAL CARE Performed by: Catha GosselinPatel-Mills, Diannie Willner Total critical care time: 35 minutes  Critical care time was exclusive of separately billable procedures and treating other  patients.  Critical care was necessary to treat or prevent imminent or life-threatening deterioration.  Critical care was time spent personally by me on the following activities: development of treatment plan with patient and/or surrogate as well as nursing, discussions with consultants, evaluation of patient's response to treatment, examination of patient, obtaining history from patient or surrogate, ordering and performing treatments and interventions, ordering and review of laboratory studies, ordering and review of radiographic studies, pulse oximetry and re-evaluation of patient's condition.  MDM   Final diagnoses:  Shortness of breath  Asthma exacerbation  Patient presents for shortness of breath. He was seen earlier today and was given Solu-Medrol, magnesium, and nebulizer treatments. He states he was feeling a little bit better but when he got home his symptoms returned. He states that he cannot ambulate even short distances without being out of breath. He denies any fever or cough. Patient is tachypnea, tachycardic, and 90% on 4 L of oxygen at bedside. Medications  ipratropium-albuterol (DUONEB) 0.5-2.5 (3) MG/3ML  nebulizer solution 3 mL (3 mLs Nebulization Given 02/07/15 1302)  sodium chloride 0.9 % bolus 1,000 mL (1,000 mLs Intravenous New Bag/Given 02/07/15 1342)  methylPREDNISolone sodium succinate (SOLU-MEDROL) 125 mg/2 mL injection 125 mg (125 mg Intravenous Given 02/07/15 1342)  albuterol (PROVENTIL,VENTOLIN) solution continuous neb (10 mg/hr Nebulization Given 02/07/15 1405)  ipratropium (ATROVENT) nebulizer solution 0.5 mg (0.5 mg Nebulization Given 02/07/15 1349)  magnesium sulfate IVPB 2 g 50 mL (2 g Intravenous New Bag/Given 02/07/15 1358)   After continuous nebulizer patient is still on 3 L of oxygen and saturation is 92%. He is still tachycardic and hypoxic. I believe the patient would benefit from inpatient treatment. His hypoxia is worse with exertion. Filed Vitals:    02/07/15 1313 02/07/15 1400  BP: 116/65 130/68  Pulse: 145 139  Temp: 98.4 F (36.9 C)   Resp: 28 21  D-dimer was negative. Labs are otherwise not concerning. Chest x-ray from earlier today is negative for pneumonia or pneumothorax.  I spoke to Dr. Elisabeth Pigeon, the hospitalist, who stated that she would admit to telemetry.      Catha Gosselin, PA-C 02/07/15 1503  Courteney Randall An, MD 02/07/15 1511

## 2015-02-07 NOTE — ED Notes (Signed)
Per pt, asthma symptoms-was seen this am for same, not getting better

## 2015-02-07 NOTE — ED Notes (Signed)
Pt can go to floor at 15:40, Elita QuickPam

## 2015-02-07 NOTE — H&P (Addendum)
[facility] History and Physical  Nicholas Brooks:[id number] DOB: [birth year] DOA: 02/07/2015  Referring physician: ER PA: Ivar Bury Mils  PCP: [name], MD  Chief Complaint: shortness of breath   HPI:  29 year old male with past medical history of asthma (on rescue inhaler and Symbicort for asthma control). He presented to [facility] ED with worsening shortness of breath for past 24 hours prior to this admission. He usually uses albuterol inhaler as needed about 2-3 times a week but in last day or so he used it more frequently almost every 4-6 hours for shortness of breath with little symptomatic relief. He had frequent nighttime awakenings requiring use of albuterol inhaler. He has assocaited non productive cough. No previous hospitalizations for asthma exacerbation. No history of intubations. No fevers or chills. No chest pain, palpations. No abdominal pain, nausea or vomiting. No lightheadedness or loss of consciousness. No diarrhea or constipation. No blood ins tool or urine.   In ED, BP was 115/56, HR 120, RR 15. O2 sat in 89% on room air. Blood work showed WBC count 17.8, potassium 3.3, CXR showed no acute cardiopulmonary process. He was given solumedrol in ED 125 mg IV once and continuous nebulizer treatment but continued to have O2 sats less than 90% on room air and was tachypneic and tachycardic for which reason he was admitted for asthma exacerbation management.  Assessment & Plan    Principal Problem:   Acute respiratory failure with hypoxia (HCC) / Asthma exacerbation /SIRS - Hypoxia likely in the setting of acute asthma exacerbation. SIRS criteria - tachycardia, tachypnea, leukocytosis but no evidence of acute infectious process - No acute cardiopulmonary findings on CXR - Leukocytosis likely due to steroids - Will start xopenex and atrovent scheduled and as needed for shortness of breath or wheezing - Solumedrol 125 mg IV in ED, will continue with solumedrol 60  mg IV Q 12 hours  - Continue oxygen support via [place] to keep O2 sats above 90%  Active Problems:   Hypokalemia - Due to nebulizer treatments  - Subsequently WNL    Leukocytosis - Likely due to steroids  - No fevers, no acute cardiopulmonary process on CXR  DVT prophylaxis:  - SCD's bilaterally   Radiological Exams on Admission: Dg Chest 2 View 02/07/2015  No acute cardiopulmonary process seen. Electronically Signed   By: Roanna Raider M.D.   On: 02/07/2015 03:22    Code Status: Full Family Communication: Plan of care discussed with the patient  Disposition Plan: Admit for further evaluation, telemetry   [name], MD  [facility] Pager [phone]  Time spent in minutes: 75 minutes  Review of Systems:  Constitutional: Negative for fever, chills and malaise/fatigue. Negative for diaphoresis.  HENT: Negative for hearing loss, ear pain, nosebleeds, congestion, sore throat, neck pain, tinnitus and ear discharge.   Eyes: Negative for blurred vision, double vision, photophobia, pain, discharge and redness.  Respiratory: per HPI Cardiovascular: Negative for chest pain, palpitations, orthopnea, claudication and leg swelling.  Gastrointestinal: Negative for nausea, vomiting and abdominal pain. Negative for heartburn, constipation, blood in stool and melena.  Genitourinary: Negative for dysuria, urgency, frequency, hematuria and flank pain.  Musculoskeletal: Negative for myalgias, back pain, joint pain and falls.  Skin: Negative for itching and rash.  Neurological: Negative for dizziness and weakness. Negative for tingling, tremors, sensory change, speech change, focal weakness, loss of consciousness and headaches.  Endo/Heme/Allergies: Negative for environmental allergies and polydipsia. Does not bruise/bleed easily.  Psychiatric/Behavioral: Negative  for suicidal ideas. The patient is not nervous/anxious.      History reviewed. No pertinent past medical history. Past Surgical  History  Procedure Laterality Date  . Mandible surgery    . Wisdom tooth extraction     Social History:  reports that he has been smoking Cigarettes.  He has a 4 pack-year smoking history. He has never used smokeless tobacco. He reports that he does not drink alcohol or use illicit drugs.  No Known Allergies  Family History:  Family History  Problem Relation Age of Onset  . Cancer Maternal Grandmother     colon  . Asthma Sister      Prior to Admission medications   Medication Sig Start Date End Date Taking? Authorizing Provider  albuterol (PROVENTIL HFA;VENTOLIN HFA) 108 (90 BASE) MCG/ACT inhaler Inhale 1-2 puffs into the lungs every 4 (four) hours as needed for wheezing or shortness of breath (cough). 02/07/15  Yes Mercedes Camprubi-Soms, PA-C  albuterol (PROVENTIL) (2.5 MG/3ML) 0.083% nebulizer solution USE ONE VIAL IN NEBULIZER EVERY 4 HOURS AS NEEDED FOR WHEEZING 12/28/14  Yes Coralyn HellingVineet Sood, MD  budesonide-formoterol (SYMBICORT) 160-4.5 MCG/ACT inhaler Inhale 2 puffs into the lungs 2 (two) times daily. Patient not taking: Reported on 02/07/2015 12/08/14   Virgel Bouquetammy S Parrett, NP  doxycycline (VIBRAMYCIN) 100 MG capsule Take 1 capsule (100 mg total) by mouth 2 (two) times daily. Patient not taking: Reported on 02/07/2015 12/23/14   Nelva Nayobert Beaton, MD  predniSONE (DELTASONE) 20 MG tablet 3 tabs po daily x 4 days 02/07/15   Allen DerryMercedes Camprubi-Soms, PA-C   Physical Exam: Filed Vitals:   02/07/15 1400 02/07/15 1405 02/07/15 1430 02/07/15 1500  BP: 130/68  118/100 135/64  Pulse: 139  139 142  Temp:      TempSrc:      Resp: 21  15 24   SpO2: 95% 92% 96% 95%    Physical Exam  Constitutional: Appears well-developed and well-nourished. No distress.  HENT: Normocephalic. No tonsillar erythema or exudates Eyes: Conjunctivae are normal.No scleral icterus.  Neck: Normal ROM. Neck supple. No JVD. No tracheal deviation. No thyromegaly.  CVS: tachycardic, S1/S2 +, no murmurs, no gallops, no  carotid bruit.  Pulmonary: wheezing in mid and upper lung lobes, no rhonchi.  Abdominal: Soft. BS +,  no distension, tenderness, rebound or guarding.  Musculoskeletal: Normal range of motion. No edema and no tenderness.  Lymphadenopathy: No lymphadenopathy noted, cervical, inguinal. Neuro: Alert. Normal reflexes, muscle tone coordination. No focal neurologic deficits. Skin: Skin is warm and dry. No rash noted.  No erythema. No pallor.  Psychiatric: Normal mood and affect. Behavior, judgment, thought content normal.   Labs on Admission:  Basic Metabolic Panel:  Recent Labs Lab 02/07/15 0335 02/07/15 1333  NA 139 142  K 3.3* 3.8  CL 104 105  CO2 21* 20*  GLUCOSE 125* 176*  BUN 14 14  CREATININE 1.04 1.17  CALCIUM 8.7* 9.2   Liver Function Tests: No results for input(s): AST, ALT, ALKPHOS, BILITOT, PROT, ALBUMIN in the last 168 hours. No results for input(s): LIPASE, AMYLASE in the last 168 hours. No results for input(s): AMMONIA in the last 168 hours. CBC:  Recent Labs Lab 02/07/15 0335 02/07/15 1333  WBC 17.8* 14.8*  NEUTROABS 15.4* 13.0*  HGB 14.6 14.6  HCT 43.0 43.9  MCV 90.1 92.0  PLT 261 290   Cardiac Enzymes: No results for input(s): CKTOTAL, CKMB, CKMBINDEX, TROPONINI in the last 168 hours. BNP: Invalid input(s): POCBNP CBG: No results for input(s): GLUCAP  in the last 168 hours.  If 7PM-7AM, please contact night-coverage [email or web address] Password [facility] 02/07/2015, 3:59 PM

## 2015-02-08 LAB — BASIC METABOLIC PANEL
Anion gap: 12 (ref 5–15)
BUN: 18 mg/dL (ref 6–20)
CALCIUM: 8.9 mg/dL (ref 8.9–10.3)
CO2: 21 mmol/L — ABNORMAL LOW (ref 22–32)
Chloride: 107 mmol/L (ref 101–111)
Creatinine, Ser: 0.93 mg/dL (ref 0.61–1.24)
GFR calc Af Amer: >60 mL/min (ref >60)
GLUCOSE: 118 mg/dL — AB (ref 65–99)
Potassium: 4.3 mmol/L (ref 3.5–5.1)
Sodium: 140 mmol/L (ref 135–145)

## 2015-02-08 LAB — CBC
HEMATOCRIT: 40.8 % (ref 39.0–52.0)
Hemoglobin: 13.6 g/dL (ref 13.0–17.0)
MCH: 30.4 pg (ref 26.0–34.0)
MCHC: 33.3 g/dL (ref 30.0–36.0)
MCV: 91.3 fL (ref 78.0–100.0)
Platelets: 297 10*3/uL (ref 150–400)
RBC: 4.47 MIL/uL (ref 4.22–5.81)
RDW: 15 % (ref 11.5–15.5)
WBC: 28.2 10*3/uL — ABNORMAL HIGH (ref 4.0–10.5)

## 2015-02-08 LAB — GLUCOSE, CAPILLARY: GLUCOSE-CAPILLARY: 110 mg/dL — AB (ref 65–99)

## 2015-02-08 MED ORDER — IPRATROPIUM BROMIDE 0.02 % IN SOLN
0.5000 mg | RESPIRATORY_TRACT | Status: DC | PRN
  Administered 2015-02-08 – 2015-02-09 (×2): 0.5 mg via RESPIRATORY_TRACT
  Filled 2015-02-08 (×2): qty 2.5

## 2015-02-08 MED ORDER — METHYLPREDNISOLONE SODIUM SUCC 125 MG IJ SOLR
60.0000 mg | INTRAMUSCULAR | Status: DC
  Administered 2015-02-08: 60 mg via INTRAVENOUS
  Filled 2015-02-08: qty 0.96

## 2015-02-08 MED ORDER — LEVALBUTEROL HCL 0.63 MG/3ML IN NEBU
0.6300 mg | INHALATION_SOLUTION | RESPIRATORY_TRACT | Status: DC | PRN
  Administered 2015-02-08 – 2015-02-10 (×3): 0.63 mg via RESPIRATORY_TRACT
  Filled 2015-02-08 (×3): qty 3

## 2015-02-08 NOTE — Progress Notes (Addendum)
Patient ID: Nicholas Brooks, male   DOB: 02-24-1985, 29 y.o.   MRN: [id number] [facility] PROGRESS NOTE  Nicholas Brooks [id number]:[id number] DOB: 09-Aug-1985 DOA: 02/07/2015 PCP: Hoyt Koch, MD  Brief narrative:    29 year old male with past medical history of asthma (on rescue inhaler and Symbicort for asthma control). He presented to [facility] ED with worsening shortness of breath for past 24 hours prior to this admission. He usually uses albuterol inhaler as needed about 2-3 times a week but in last day or so he used it more frequently almost every 4-6 hours for shortness of breath with little symptomatic relief. He had frequent nighttime awakenings as well requiring use of albuterol inhaler. He had assocaited non productive cough. No previous hospitalizations for asthma exacerbation. No history of intubations. No fevers.  In ED, BP was 115/56, HR 120, RR 15. O2 sat in 89% on room air. Blood work showed WBC count 17.8, potassium 3.3, CXR showed no acute cardiopulmonary process. He was given solumedrol in ED 125 mg IV once and continuous nebulizer treatment but continued to have O2 sats less than 90% on room air and was tachypneic and tachycardic for which reason pt was admitted for asthma exacerbation management.   Assessment/Plan:    Principal Problem:  Acute respiratory failure with hypoxia (HCC) / Asthma exacerbation /SIRS - Hypoxia likely due to acute asthma exacerbation.  - SIRS criteria met on admission with tachycardia, tachypnea, leukocytosis but no evidence of acute infectious process - CXR demonstrated no acute cardiopulmonary process on admssion - Continue xopenex and atrovent scheduled and as needed for shortness of breath or wheezing - Continue solumedrol 60 mg IV but reduce frequency to once a day - Continue oxygen support via [place] to keep O2 sats above 90%  Active Problems:  Hypokalemia - Secondary to nebulizer treatments  - Now WNL   Leukocytosis - Likely due to  steroids   DVT Prophylaxis  - SCD's bilaterally   Code Status: Full.  Family Communication:  plan of care discussed with the patient Disposition Plan: Home likely 02/09/2015.  IV access:  Peripheral IV  Procedures and diagnostic studies:    Dg Chest 2 View 02/07/2015  No acute cardiopulmonary process seen. Electronically Signed   By: Garald Balding M.D.   On: 02/07/2015 03:22   Medical Consultants:  None   Other Consultants:  None  IAnti-Infectives:   None    Leisa Lenz, MD  [facility] Pager [phone]  Time spent in minutes: 25 minutes  If 7PM-7AM, please contact night-coverage [email or web address] Password [facility] 02/08/2015, 11:45 AM   LOS: 1 day    HPI/Subjective: No acute overnight events. Patient reports he feels short of breath with ambulation.  Objective: Filed Vitals:   02/08/15 0500 02/08/15 0536 02/08/15 0814 02/08/15 0819  BP:  138/78    Pulse:  130    Temp:  98.6 F (37 C)    TempSrc:  Oral    Resp:  20    Height:      Weight: 95.1 kg (209 lb 10.5 oz)     SpO2:  96% 96% 95%   No intake or output data in the 24 hours ending 02/08/15 1145  Exam:   General:  Pt is alert, follows commands appropriately, not in acute distress  Cardiovascular: Regular rate and rhythm, S1/S2, no murmurs  Respiratory: wheezing in upper lung lobes, no rhonchi   Abdomen: Soft, non tender, non distended, bowel sounds present  Extremities: No edema,  pulses DP and PT palpable bilaterally  Neuro: Grossly nonfocal  Data Reviewed: Basic Metabolic Panel:  Recent Labs Lab 02/07/15 0335 02/07/15 1333 02/08/15 0458  NA 139 142 140  K 3.3* 3.8 4.3  CL 104 105 107  CO2 21* 20* 21*  GLUCOSE 125* 176* 118*  BUN _0 CREATININE 1.04 1.17 0.93  CALCIUM 8.7* 9.2 8.9   Liver Function Tests: No results for input(s): AST, ALT, ALKPHOS, BILITOT, PROT, ALBUMIN in the last 168 hours. No results for input(s): LIPASE, AMYLASE in the last 168 hours. No results  for input(s): AMMONIA in the last 168 hours. CBC:  Recent Labs Lab 02/07/15 0335 02/07/15 1333 02/08/15 0458  WBC 17.8* 14.8* 28.2*  NEUTROABS 15.4* 13.0*  --   HGB 14.6 14.6 13.6  HCT 43.0 43.9 40.8  MCV 90.1 92.0 91.3  PLT 261 290 297   Cardiac Enzymes: No results for input(s): CKTOTAL, CKMB, CKMBINDEX, TROPONINI in the last 168 hours. BNP: Invalid input(s): POCBNP CBG:  Recent Labs Lab 02/08/15 0808  GLUCAP 110*    No results found for this or any previous visit (from the past 240 hour(s)).   Scheduled Meds: . ipratropium  0.5 mg Nebulization Q6H  . levalbuterol  0.63 mg Nebulization Q6H  . methylPREDNISolone (SOLU-MEDROL) injection  60 mg Intravenous Q24H  . sodium chloride  3 mL Intravenous Q12H   Continuous Infusions: . sodium chloride

## 2015-02-09 LAB — GLUCOSE, CAPILLARY: Glucose-Capillary: 104 mg/dL — ABNORMAL HIGH (ref 65–99)

## 2015-02-09 MED ORDER — PREDNISONE 50 MG PO TABS
50.0000 mg | ORAL_TABLET | Freq: Every day | ORAL | Status: DC
  Administered 2015-02-10: 50 mg via ORAL
  Filled 2015-02-09 (×2): qty 1

## 2015-02-09 MED ORDER — METHYLPREDNISOLONE SODIUM SUCC 125 MG IJ SOLR: 60.0000 mg | Freq: Once | INTRAMUSCULAR | Status: DC

## 2015-02-09 NOTE — Progress Notes (Addendum)
Patient ID: Nicholas Brooks, male   DOB: [birth year], 29 y.o.   MRN: [id number] [facility] PROGRESS NOTE  Nicholas Brooks FTD:[id number] DOB: [birth year] DOA: 02/07/2015 PCP: Hoyt Koch, MD  Brief narrative:    29 year old male with past medical history of asthma (on rescue inhaler and Symbicort for asthma control). He presented to [facility] ED with worsening shortness of breath for past 24 hours prior to this admission. He usually uses albuterol inhaler as needed about 2-3 times a week but in last day or so he used it more frequently almost every 4-6 hours for shortness of breath with little symptomatic relief. He had frequent nighttime awakenings as well requiring use of albuterol inhaler. He had assocaited non productive cough. No previous hospitalizations for asthma exacerbation. No history of intubations. No fevers.  In ED, BP was 115/56, HR 120, RR 15. O2 sat in 89% on room air. Blood work showed WBC count 17.8, potassium 3.3, CXR showed no acute cardiopulmonary process. He was given solumedrol in ED 125 mg IV once and continuous nebulizer treatment but continued to have O2 sats less than 90% on room air and was tachypneic and tachycardic for which reason pt was admitted for asthma exacerbation management.   Assessment/Plan:    Principal Problem:  Acute respiratory failure with hypoxia (HCC) / Asthma exacerbation /SIRS - Hypoxia likely due to acute asthma exacerbation.  - SIRS criteria met on admission with tachycardia, tachypnea, leukocytosis however no evidence of acute infectious process - CXR without acute cardiopulmonary findings.  - Continue xopenex and atrovent scheduled and as needed for shortness of breath or wheezing - Change solumedrol to prednisone today - D/C home in am if he feels better - O2 sat 97% on room air  Active Problems:  Hypokalemia - Secondary to nebulizer treatments  - Resolved    Leukocytosis - Due to steroids   DVT Prophylaxis  - SCD's    Code Status: Full.  Family Communication:  plan of care discussed with the patient Disposition Plan: Home likely 02/10/2015.  IV access:  Peripheral IV  Procedures and diagnostic studies:    Dg Chest 2 View 02/07/2015  No acute cardiopulmonary process seen. Electronically Signed   By: Garald Balding M.D.   On: 02/07/2015 03:22   Medical Consultants:  None   Other Consultants:  None  IAnti-Infectives:   None    Leisa Lenz, MD  [facility] Pager [phone]  Time spent in minutes: 15 minutes  If 7PM-7AM, please contact night-coverage [email or web address] Password [facility] 02/09/2015, 11:35 AM   LOS: 2 days    HPI/Subjective: No acute overnight events. Patient reports he feels better but does get really short of breath with exertion.   Objective: Filed Vitals:   02/08/15 2214 02/08/15 2232 02/09/15 0604 02/09/15 1029  BP: 158/81  131/68   Pulse: 101  96   Temp: 98.1 F (36.7 C)  97.7 F (36.5 C)   TempSrc: Oral  Oral   Resp: 18  20   Height:      Weight:   93.9 kg (207 lb 0.2 oz)   SpO2: 95% 96% 93% 97%    Intake/Output Summary (Last 24 hours) at 02/09/15 1135 Last data filed at 02/09/15 0848  Gross per 24 hour  Intake   1080 ml  Output      0 ml  Net   1080 ml    Exam:   General:  Pt is alert, not in acute distress  Cardiovascular: RRR,  S1/S2 (+)  Respiratory: wheezing mild but no rhonchi   Abdomen: (+) BS, non tender   Extremities: No leg swelling, pulses palpable bilaterally  Neuro: Nonfocal  Data Reviewed: Basic Metabolic Panel:  Recent Labs Lab 02/07/15 0335 02/07/15 1333 02/08/15 0458  NA 139 142 140  K 3.3* 3.8 4.3  CL 104 105 107  CO2 21* 20* 21*  GLUCOSE 125* 176* 118*  BUN _0 CREATININE 1.04 1.17 0.93  CALCIUM 8.7* 9.2 8.9   Liver Function Tests: No results for input(s): AST, ALT, ALKPHOS, BILITOT, PROT, ALBUMIN in the last 168 hours. No results for input(s): LIPASE, AMYLASE in the last 168 hours. No results  for input(s): AMMONIA in the last 168 hours. CBC:  Recent Labs Lab 02/07/15 0335 02/07/15 1333 02/08/15 0458  WBC 17.8* 14.8* 28.2*  NEUTROABS 15.4* 13.0*  --   HGB 14.6 14.6 13.6  HCT 43.0 43.9 40.8  MCV 90.1 92.0 91.3  PLT 261 290 297   Cardiac Enzymes: No results for input(s): CKTOTAL, CKMB, CKMBINDEX, TROPONINI in the last 168 hours. BNP: Invalid input(s): POCBNP CBG:  Recent Labs Lab 02/08/15 0808 02/09/15 0753  GLUCAP 110* 104*    No results found for this or any previous visit (from the past 240 hour(s)).   Scheduled Meds: . ipratropium  0.5 mg Nebulization Q6H  . levalbuterol  0.63 mg Nebulization Q6H  . [START ON 02/10/2015] predniSONE  50 mg Oral Q breakfast  . sodium chloride  3 mL Intravenous Q12H   Continuous Infusions: . sodium chloride

## 2015-02-10 MED ORDER — PREDNISONE 5 MG PO TABS: 50.0000 mg | ORAL_TABLET | Freq: Every day | ORAL | Status: DC

## 2015-02-10 MED ORDER — ALBUTEROL SULFATE HFA 108 (90 BASE) MCG/ACT IN AERS: 1.0000 | INHALATION_SPRAY | RESPIRATORY_TRACT | Status: DC | PRN

## 2015-02-10 MED ORDER — BUDESONIDE-FORMOTEROL FUMARATE 160-4.5 MCG/ACT IN AERO: 2.0000 | INHALATION_SPRAY | Freq: Two times a day (BID) | RESPIRATORY_TRACT | Status: DC

## 2015-02-10 MED ORDER — ALBUTEROL SULFATE (2.5 MG/3ML) 0.083% IN NEBU: INHALATION_SOLUTION | RESPIRATORY_TRACT | Status: DC

## 2015-02-10 NOTE — Discharge Summary (Signed)
Physician Discharge Summary  Nicholas Brooks GQQ:[id number] DOB: Oct 09, 1985 DOA: 02/07/2015  PCP: Hoyt Koch, MD  Admit date: 02/07/2015 Discharge date: 02/10/2015  Recommendations for Outpatient Follow-up:  1. Continue prednisone taper on discharge as prescribed starting from 50 mg, taper down by 5 mg a day down to 0. 2. Prescriptions provided for albuterol inhaler, albuterol nebulizer and Symbicort inhaler  Discharge Diagnoses:  Principal Problem:   Acute respiratory failure with hypoxia (HCC) Active Problems:   SIRS (systemic inflammatory response syndrome) (HCC)   Asthma exacerbation   Hypokalemia   Leukocytosis    Discharge Condition: stable   Diet recommendation: as tolerated   History of present illness:  29 year old male with past medical history of asthma (on rescue inhaler and Symbicort for asthma control). He presented to [facility] ED with worsening shortness of breath for past 24 hours prior to this admission. He usually uses albuterol inhaler as needed about 2-3 times a week but in last day or so he used it more frequently almost every 4-6 hours for shortness of breath with little symptomatic relief. He had frequent nighttime awakenings as well requiring use of albuterol inhaler. He had assocaited non productive cough. No previous hospitalizations for asthma exacerbation. No history of intubations. No fevers.  In ED, BP was 115/56, HR 120, RR 15. O2 sat in 89% on room air. Blood work showed WBC count 17.8, potassium 3.3, CXR showed no acute cardiopulmonary process. He was given solumedrol in ED 125 mg IV once and continuous nebulizer treatment but continued to have O2 sats less than 90% on room air and was tachypneic and tachycardic for which reason pt was admitted for asthma exacerbation management.  Hospital Course:   Assessment/Plan:    Principal Problem:  Acute respiratory failure with hypoxia (HCC) / Asthma exacerbation /SIRS - Hypoxia likely due to  acute asthma exacerbation.  - SIRS criteria met on admission with tachycardia, tachypnea, leukocytosis however no evidence of acute infectious process - CXR on admission did not show acute cardiopulmonary findings  - Patient wasn't scheduled and as needed Xopenex and Atrovent in hospital  - Patient was also Solu-Medrol in hospital and we changed this to prednisone on 02/09/2015 which he tolerated well  - Patient will continue prednisone taper as mentioned above  - Continue xopenex and atrovent scheduled and as needed for shortness of breath or wheezing - O2 sat 95% on room air  Active Problems:  Hypokalemia - Secondary to nebulizer treatments  - Resolved    Leukocytosis - Due to steroids   DVT Prophylaxis  - SCD's in hospital   Code Status: Full.  Family Communication: plan of care discussed with the patient   IV access:  Peripheral IV  Procedures and diagnostic studies:   Dg Chest 2 View 02/07/2015 No acute cardiopulmonary process seen. Electronically Signed By: Garald Balding M.D. On: 02/07/2015 03:22   Medical Consultants:  None   Other Consultants:  None  IAnti-Infectives:   None    Signed:  Leisa Lenz, MD  [facility] 02/10/2015, 8:50 AM  Pager #: [phone]  Time spent in minutes: more than 30 minutes    Discharge Exam: Filed Vitals:   02/10/15 0154 02/10/15 0627  BP: 124/80 121/106  Pulse: 93 108  Temp: 97.8 F (36.6 C) 97.8 F (36.6 C)  Resp: 18 18   Filed Vitals:   02/10/15 0154 02/10/15 0200 02/10/15 0558 02/10/15 0627  BP: 124/80   121/106  Pulse: 93   108  Temp:  97.8 F (36.6 C)   97.8 F (36.6 C)  TempSrc: Oral   Oral  Resp: 18   18  Height:  '5\' 8"'  (1.727 m)    Weight:  96.2 kg (212 lb 1.3 oz)    SpO2: 96%  97% 95%    General: Pt is alert, follows commands appropriately, not in acute distress Cardiovascular: Regular rate and rhythm, S1/S2 +, no murmurs Respiratory: Clear to auscultation  bilaterally, no wheezing, no crackles, no rhonchi Abdominal: Soft, non tender, non distended, bowel sounds +, no guarding Extremities: no edema, no cyanosis, pulses palpable bilaterally DP and PT Neuro: Grossly nonfocal  Discharge Instructions  Discharge Instructions    Call MD for:  difficulty breathing, headache or visual disturbances    Complete by:  As directed      Call MD for:  persistant nausea and vomiting    Complete by:  As directed      Call MD for:  redness, tenderness, or signs of infection (pain, swelling, redness, odor or green/yellow discharge around incision site)    Complete by:  As directed      Call MD for:  severe uncontrolled pain    Complete by:  As directed      Diet - low sodium heart healthy    Complete by:  As directed      Discharge instructions    Complete by:  As directed   Taper down prednisone starting from 50 mg a day, taper down by 5 mg a day down to 0 mg. for ex, today 50 mg, tomorrow 45 mg, then 40 mg the following day and etc...     Increase activity slowly    Complete by:  As directed             Medication List    STOP taking these medications        doxycycline 100 MG capsule  Commonly known as:  VIBRAMYCIN      TAKE these medications        albuterol 108 (90 Base) MCG/ACT inhaler  Commonly known as:  PROVENTIL HFA;VENTOLIN HFA  Inhale 1-2 puffs into the lungs every 4 (four) hours as needed for wheezing or shortness of breath (cough).     albuterol (2.5 MG/3ML) 0.083% nebulizer solution  Commonly known as:  PROVENTIL  USE ONE VIAL IN NEBULIZER EVERY 4 HOURS AS NEEDED FOR WHEEZING     budesonide-formoterol 160-4.5 MCG/ACT inhaler  Commonly known as:  SYMBICORT  Inhale 2 puffs into the lungs 2 (two) times daily.     predniSONE 5 MG tablet  Commonly known as:  DELTASONE  Take 10 tablets (50 mg total) by mouth daily with breakfast. Taper down prednisone starting from 50 mg a day, taper down by 5 mg a day down to 0 mg. for ex,  today 50 mg, tomorrow 45 mg, then 40 mg the following day and etc...           Follow-up Information    Follow up with Hoyt Koch, MD. Schedule an appointment as soon as possible for a visit in 1 week.   Specialty:  Internal Medicine   Why:  Follow up appt after recent hospitalization   Contact information:   [address] [place] [address] [phone]        The results of significant diagnostics from this hospitalization (including imaging, microbiology, ancillary and laboratory) are listed below for reference.    Significant Diagnostic Studies: Dg Chest 2  View  02/07/2015  CLINICAL DATA:  Acute onset of shortness of breath and wheezing. Initial encounter. EXAM: CHEST  2 VIEW COMPARISON:  Chest radiograph performed 12/23/2014 FINDINGS: The lungs are well-aerated and clear. There is no evidence of focal opacification, pleural effusion or pneumothorax. The heart is normal in size; the mediastinal contour is within normal limits. No acute osseous abnormalities are seen. IMPRESSION: No acute cardiopulmonary process seen. Electronically Signed   By: Garald Balding M.D.   On: 02/07/2015 03:22    Microbiology: No results found for this or any previous visit (from the past 240 hour(s)).   Labs: Basic Metabolic Panel:  Recent Labs Lab 02/07/15 0335 02/07/15 1333 02/08/15 0458  NA 139 142 140  K 3.3* 3.8 4.3  CL 104 105 107  CO2 21* 20* 21*  GLUCOSE 125* 176* 118*  BUN '14 14 18  ' CREATININE 1.04 1.17 0.93  CALCIUM 8.7* 9.2 8.9   Liver Function Tests: No results for input(s): AST, ALT, ALKPHOS, BILITOT, PROT, ALBUMIN in the last 168 hours. No results for input(s): LIPASE, AMYLASE in the last 168 hours. No results for input(s): AMMONIA in the last 168 hours. CBC:  Recent Labs Lab 02/07/15 0335 02/07/15 1333 02/08/15 0458  WBC 17.8* 14.8* 28.2*  NEUTROABS 15.4* 13.0*  --   HGB 14.6 14.6 13.6  HCT 43.0 43.9 40.8  MCV 90.1 92.0 91.3  PLT 261 290 297    Cardiac Enzymes: No results for input(s): CKTOTAL, CKMB, CKMBINDEX, TROPONINI in the last 168 hours. BNP: BNP (last 3 results) No results for input(s): BNP in the last 8760 hours.  ProBNP (last 3 results) No results for input(s): PROBNP in the last 8760 hours.  CBG:  Recent Labs Lab 02/08/15 0808 02/09/15 0753  GLUCAP 110* 104*

## 2015-02-10 NOTE — Discharge Instructions (Signed)
Asthma, Acute Bronchospasm °Acute bronchospasm caused by asthma is also referred to as an asthma attack. Bronchospasm means your air passages become narrowed. The narrowing is caused by inflammation and tightening of the muscles in the air tubes (bronchi) in your lungs. This can make it hard to breathe or cause you to wheeze and cough. °CAUSES °Possible triggers are: °· Animal dander from the skin, hair, or feathers of animals. °· Dust mites contained in house dust. °· Cockroaches. °· Pollen from trees or grass. °· Mold. °· Cigarette or tobacco smoke. °· Air pollutants such as dust, household cleaners, hair sprays, aerosol sprays, paint fumes, strong chemicals, or strong odors. °· Cold air or weather changes. Cold air may trigger inflammation. Winds increase molds and pollens in the air. °· Strong emotions such as crying or laughing hard. °· Stress. °· Certain medicines such as aspirin or beta-blockers. °· Sulfites in foods and drinks, such as dried fruits and wine. °· Infections or inflammatory conditions, such as a flu, cold, or inflammation of the nasal membranes (rhinitis). °· Gastroesophageal reflux disease (GERD). GERD is a condition where stomach acid backs up into your esophagus. °· Exercise or strenuous activity. °SIGNS AND SYMPTOMS  °· Wheezing. °· Excessive coughing, particularly at night. °· Chest tightness. °· Shortness of breath. °DIAGNOSIS  °Your health care provider will ask you about your medical history and perform a physical exam. A chest X-ray or blood testing may be performed to look for other causes of your symptoms or other conditions that may have triggered your asthma attack.  °TREATMENT  °Treatment is aimed at reducing inflammation and opening up the airways in your lungs.  Most asthma attacks are treated with inhaled medicines. These include quick relief or rescue medicines (such as bronchodilators) and controller medicines (such as inhaled corticosteroids). These medicines are sometimes  given through an inhaler or a nebulizer. Systemic steroid medicine taken by mouth or given through an IV tube also can be used to reduce the inflammation when an attack is moderate or severe. Antibiotic medicines are only used if a bacterial infection is present.  °HOME CARE INSTRUCTIONS  °· Rest. °· Drink plenty of liquids. This helps the mucus to remain thin and be easily coughed up. Only use caffeine in moderation and do not use alcohol until you have recovered from your illness. °· Do not smoke. Avoid being exposed to secondhand smoke. °· You play a critical role in keeping yourself in good health. Avoid exposure to things that cause you to wheeze or to have breathing problems. °· Keep your medicines up-to-date and available. Carefully follow your health care provider's treatment plan. °· Take your medicine exactly as prescribed. °· When pollen or pollution is bad, keep windows closed and use an air conditioner or go to places with air conditioning. °· Asthma requires careful medical care. See your health care provider for a follow-up as advised. If you are more than [redacted] weeks pregnant and you were prescribed any new medicines, let your obstetrician know about the visit and how you are doing. Follow up with your health care provider as directed. °· After you have recovered from your asthma attack, make an appointment with your outpatient doctor to talk about ways to reduce the likelihood of future attacks. If you do not have a doctor who manages your asthma, make an appointment with a primary care doctor to discuss your asthma. °SEEK IMMEDIATE MEDICAL CARE IF:  °· You are getting worse. °· You have trouble breathing. If severe, call your local   emergency services (911 in the [place]  You develop chest pain or discomfort.  You are vomiting.  You are not able to keep fluids down.  You are coughing up yellow, green, brown, or bloody sputum.  You have a fever and your symptoms suddenly get worse.  You have  trouble swallowing. MAKE SURE YOU:   Understand these instructions.  Will watch your condition.  Will get help right away if you are not doing well or get worse.   This information is not intended to replace advice given to you by your health care provider. Make sure you discuss any questions you have with your health care provider.   Document Released: 05/17/2006 Document Revised: 02/04/2013 Document Reviewed: 08/07/2012 Elsevier Interactive Patient Education 2016 Else[employer] Inc.  Asthma, Adult Asthma is a condition of the lungs in which the airways tighten and narrow. Asthma can make it hard to breathe. Asthma cannot be cured, but medicine and lifestyle changes can help control it. Asthma may be started (triggered) by:  Animal skin flakes (dander).  Dust.  Cockroaches.  Pollen.  Mold.  Smoke.  Cleaning products.  Hair sprays or aerosol sprays.  Paint fumes or strong smells.  Cold air, weather changes, and winds.  Crying or laughing hard.  Stress.  Certain medicines or drugs.  Foods, such as dried fruit, potato chips, and sparkling grape juice.  Infections or conditions (colds, flu).  Exercise.  Certain medical conditions or diseases.  Exercise or tiring activities. HOME CARE   Take medicine as told by your doctor.  Use a peak flow meter as told by your doctor. A peak flow meter is a tool that measures how well the lungs are working.  Record and keep track of the peak flow meter's readings.  Understand and use the asthma action plan. An asthma action plan is a written plan for taking care of your asthma and treating your attacks.  To help prevent asthma attacks:  Do not smoke. Stay away from secondhand smoke.  Change your heating and air conditioning filter often.  Limit your use of fireplaces and wood stoves.  Get rid of pests (such as roaches and mice) and their droppings.  Throw away plants if you see mold on them.  Clean your floors. Dust  regularly. Use cleaning products that do not smell.  Have someone vacuum when you are not home. Use a vacuum cleaner with a HEPA filter if possible.  Replace carpet with wood, tile, or vinyl flooring. Carpet can trap animal skin flakes and dust.  Use allergy-proof pillows, mattress covers, and box spring covers.  Wash bed sheets and blankets every week in hot water and dry them in a dryer.  Use blankets that are made of polyester or cotton.  Clean bathrooms and kitchens with bleach. If possible, have someone repaint the walls in these rooms with mold-resistant paint. Keep out of the rooms that are being cleaned and painted.  Wash hands often. GET HELP IF:  You have make a whistling sound when breaking (wheeze), have shortness of breath, or have a cough even if taking medicine to prevent attacks.  The colored mucus you cough up (sputum) is thicker than usual.  The colored mucus you cough up changes from clear or white to yellow, green, gray, or bloody.  You have problems from the medicine you are taking such as:  A rash.  Itching.  Swelling.  Trouble breathing.  You need reliever medicines more than 2-3 times a week.  Your peak  flow measurement is still at 50-79% of your personal best after following the action plan for 1 hour.  You have a fever. GET HELP RIGHT AWAY IF:   You seem to be worse and are not responding to medicine during an asthma attack.  You are short of breath even at rest.  You get short of breath when doing very little activity.  You have trouble eating, drinking, or talking.  You have chest pain.  You have a fast heartbeat.  Your lips or fingernails start to turn blue.  You are light-headed, dizzy, or faint.  Your peak flow is less than 50% of your personal best.   This information is not intended to replace advice given to you by your health care provider. Make sure you discuss any questions you have with your health care provider.     Document Released: 07/19/2007 Document Revised: 10/21/2014 Document Reviewed: 08/29/2012 Elsevier Interactive Patient Education 2016 Elsevier Inc.  Asthma Attack Prevention While you may not be able to control the fact that you have asthma, you can take actions to prevent asthma attacks. The best way to prevent asthma attacks is to maintain good control of your asthma. You can achieve this by:  Taking your medicines as directed.  Avoiding things that can irritate your airways or make your asthma symptoms worse (asthma triggers).  Keeping track of how well your asthma is controlled and of any changes in your symptoms.  Responding quickly to worsening asthma symptoms (asthma attack).  Seeking emergency care when it is needed. WHAT ARE SOME WAYS TO PREVENT AN ASTHMA ATTACK? Have a Plan Work with your health care provider to create a written plan for managing and treating your asthma attacks (asthma action plan). This plan includes:  A list of your asthma triggers and how you can avoid them.  Information on when medicines should be taken and when their dosages should be changed.  The use of a device that measures how well your lungs are working (peak flow meter). Monitor Your Asthma Use your peak flow meter and record your results in a journal every day. A drop in your peak flow numbers on one or more days may indicate the start of an asthma attack. This can happen even before you start to feel symptoms. You can prevent an asthma attack from getting worse by following the steps in your asthma action plan. Avoid Asthma Triggers Work with your asthma health care provider to find out what your asthma triggers are. This can be done by:  Allergy testing.  Keeping a journal that notes when asthma attacks occur and the factors that may have contributed to them.  Determining if there are other medical conditions that are making your asthma worse. Once you have determined your asthma  triggers, take steps to avoid them. This may include avoiding excessive or prolonged exposure to:  Dust. Have someone dust and vacuum your home for you once or twice a week. Using a high-efficiency particulate arrestance (HEPA) vacuum is best.  Smoke. This includes campfire smoke, forest fire smoke, and secondhand smoke from tobacco products.  Pet dander. Avoid contact with animals that you know you are allergic to.  Allergens from trees, grasses or pollens. Avoid spending a lot of time outdoors when pollen counts are high, and on very windy days.  Very cold, dry, or humid air.  Mold.  Foods that contain high amounts of sulfites.  Strong odors.  Outdoor air pollutants, such as P[employer] scheme manager  air pollutants, such as aerosol sprays and fumes from household cleaners.  Household pests, including dust mites and cockroaches, and pest droppings.  Certain medicines, including NSAIDs. Always talk to your health care provider before stopping or starting any new medicines. Medicines Take over-the-counter and prescription medicines only as told by your health care provider. Many asthma attacks can be prevented by carefully following your medicine schedule. Taking your medicines correctly is especially important when you cannot avoid certain asthma triggers. Act Quickly If an asthma attack does happen, acting quickly can decrease how severe it is and how long it lasts. Take these steps:   Pay attention to your symptoms. If you are coughing, wheezing, or having difficulty breathing, do not wait to see if your symptoms go away on their own. Follow your asthma action plan.  If you have followed your asthma action plan and your symptoms are not improving, call your health care provider or seek immediate medical care at the nearest hospital. It is important to note how often you need to use your fast-acting rescue inhaler. If you are using your rescue inhaler more often, it may mean that your  asthma is not under control. Adjusting your asthma treatment plan may help you to prevent future asthma attacks and help you to gain better control of your condition. HOW CAN I PREVENT AN ASTHMA ATTACK WHEN I EXERCISE? Follow advice from your health care provider about whether you should use your fast-acting inhaler before exercising. Many people with asthma experience exercise-induced bronchoconstriction (EIB). This condition often worsens during vigorous exercise in cold, humid, or dry environments. Usually, people with EIB can stay very active by pre-treating with a fast-acting inhaler before exercising.   This information is not intended to replace advice given to you by your health care provider. Make sure you discuss any questions you have with your health care provider.   Document Released: 01/18/2009 Document Revised: 10/21/2014 Document Reviewed: 07/02/2014 Elsevier Interactive Patient Education 2016 [employer] Budesonide; Formoterol Inhalation What is this medicine? BUDESONIDE; FORMOTEROL (byoo DES oh nide; for MOH te rol) inhalation is a combination of two medicines that decrease inflammation and help to open up the airways in your lungs. It is used to treat asthma. Do NOT use for an acute asthma attack. This medicine may be used for other purposes; ask your health care provider or pharmacist if you have questions. What should I tell my health care provider before I take this medicine? They need to know if you have any of these conditions: -bone problems -diabetes -heart disease or irregular heartbeat -high blood pressure -immune system problems -infection -liver disease -worsening asthma -an unusual or allergic reaction to budesonide, formoterol, medicines, foods, dyes, or preservatives -pregnant or trying to get pregnant -breast-feeding How should I use this medicine? This medicine is inhaled through the mouth. Rinse your mouth with water after use. Make sure not to swallow  the water. Follow the directions on your prescription label. Do not use more often than directed. Do not stop taking except on your doctor's advice. Make sure that you are using your inhaler correctly. Ask your doctor or health care provider if you have any questions. A special MedGuide will be given to you by the pharmacist with each prescription and refill. Be sure to read this information carefully each time. Talk to your pediatrician regarding the use of this medicine in children. While this drug may be prescribed for children as young as 27 years of age for selected conditions, precautions  do apply. Overdosage: If you think you have taken too much of this medicine contact a poison control center or emergency room at once. NOTE: This medicine is only for you. Do not share this medicine with others. What if I miss a dose? If you miss a dose, use it as soon as you remember. If it is almost time for your next dose, use only that dose and continue with your regular schedule, spacing doses evenly. Do not use double or extra doses. What may interact with this medicine? Do not take this medicine with any of the following medications: -MAOIs like Carbex, Eldepryl, Marplan, Nardil, and Parnate -mifepristone -probucol -procarbazine -some other medicines for asthma like formoterol, salmeterol This medicine may also interact with the following medications: -antibiotics like clarithromycin, erythromycin -cimetidine -diuretics -grapefruit juice -itraconazole -ketoconazole -medicines for depression, anxiety, or psychotic disturbances -medicines for irregular heartbeat -methadone -some heart medicines like atenolol, metoprolol -some other medicines for breathing problems -some vaccines This list may not describe all possible interactions. Give your health care provider a list of all the medicines, herbs, non-prescription drugs, or dietary supplements you use. Also tell them if you smoke, drink  alcohol, or use illegal drugs. Some items may interact with your medicine. What should I watch for while using this medicine? Tell your doctor or health care professional if your symptoms do not improve or get worse. If you need to use your short-acting inhalers more often, call your doctor right away. Do not use more than every 12 hours. If you have asthma, be aware that using this medicine may increase your risk of dying from asthma related problems. Talk to your doctor about the risks and benefits of taking this medicine. NEVER use this medicine for an acute asthma attack. This medicine may increase your risk of getting an infection. Tell your doctor or health care professional if you are around anyone with measles or chickenpox, or if you develop sores or blisters that do not heal properly. What side effects may I notice from receiving this medicine? Side effects that you should report to your doctor or health care professional as soon as possible: -allergic reactions such as skin rash or itching, hives, swelling of the face, lips or tongue -breathing problems -changes in vision -chest pain -fast, irregular heartbeat -feeling faint or lightheaded, falls -fever -high blood pressure -nervousness -tremors -white patches or sores in mouth Side effects that usually do not require medical attention (report to your doctor or health care professional if they continue or are bothersome): -cough -different taste in mouth -headache -sore throat -stuffy nose -stomach upset This list may not describe all possible side effects. Call your doctor for medical advice about side effects. You may report side effects to FDA at 1-800-FDA-1088. Where should I keep my medicine? Keep out of the reach of children. Store in a dry place at room temperature between 20 and 25 degrees C (68 and 77 degrees F). Do not get the inhaler wet. Keep track of the number of doses used. Throw away the inhaler after using the  marked number of inhalations or after the expiration date, whichever comes first. Do not burn or puncture canister. NOTE: This sheet is a summary. It may not cover all possible information. If you have questions about this medicine, talk to your doctor, pharmacist, or health care provider.    2016, Elsevier/[employer]. (2012-06-06 16:01:23)

## 2015-02-19 ENCOUNTER — Ambulatory Visit (INDEPENDENT_AMBULATORY_CARE_PROVIDER_SITE_OTHER): Payer: Medicaid Other | Admitting: Acute Care

## 2015-02-19 ENCOUNTER — Encounter: Payer: Self-pay | Admitting: Acute Care

## 2015-02-19 VITALS — BP 124/74 | HR 80 | Temp 98.0°F | Ht 68.0 in | Wt 213.0 lb

## 2015-02-19 DIAGNOSIS — F17210 Nicotine dependence, cigarettes, uncomplicated: Secondary | ICD-10-CM | POA: Insufficient documentation

## 2015-02-19 DIAGNOSIS — R6510 Systemic inflammatory response syndrome (SIRS) of non-infectious origin without acute organ dysfunction: Secondary | ICD-10-CM | POA: Diagnosis not present

## 2015-02-19 DIAGNOSIS — J9601 Acute respiratory failure with hypoxia: Secondary | ICD-10-CM | POA: Diagnosis not present

## 2015-02-19 NOTE — Assessment & Plan Note (Addendum)
Recent admission for Asthma Flare with SIRS s/s on admission.   BP 124/74 mmHg  Pulse 80  Temp(Src) 98 F (36.7 C) (Oral)  Ht 5\' 8"  (1.727 m)  Wt 213 lb (96.616 kg)  BMI 32.39 kg/m2  SpO2 98%, RR: 16 BPM  Plan: S/s SIRS have resolved. Follow up as needed for fever, increased RR

## 2015-02-19 NOTE — Patient Instructions (Addendum)
Complete your prednisone taper which will be complete tomorrow 02/20/15. Use your Symbicort 2 puffs Twice daily, every day. Continue to use you albuterol inhaler and nebulized treatments as needed for wheezing or shortness of breath. Really work hard on quitting smoking. Use the card I gave you to get free nicotine replacement. You will see Dr. Craige CottaSood .Feb. 15, 2017 at 10:15 with Pulmonary Function Tests at 9 am that same day. Please contact office for sooner follow up if symptoms do not improve or worsen or seek emergency care

## 2015-02-19 NOTE — Assessment & Plan Note (Signed)
Doing well after hospital discharge. No wheezing or SOB. Using Spiriva daily as prescribed with clinical benefit noted. Plan: Complete your prednisone taper which will be complete tomorrow 02/20/15. Use your Symbicort 2 puffs Twice daily, every day. Continue to use you albuterol inhaler and nebulized treatments as needed for wheezing or shortness of breath. Really work hard on quitting smoking.

## 2015-02-19 NOTE — Progress Notes (Signed)
   Subjective:    Patient ID: Nicholas NancyBobby A Dines, male    DOB: 1985/05/19, 30 y.o.   MRN: [id number]  HPI: 30 year old white male asthmatic, who is a current smoker,returns for office follow up after hospitalization for acute asthma flare from 02/07/15- 02/10/15  Significant Studies: EXAM: 02/07/15  CHEST 2 VIEW  COMPARISON: Chest radiograph performed 12/23/2014  FINDINGS: The lungs are well-aerated and clear. There is no evidence of focal opacification, pleural effusion or pneumothorax.  The heart is normal in size; the mediastinal contour is within normal limits. No acute osseous abnormalities are seen.  IMPRESSION: No acute cardiopulmonary process seen.  12/08/14: Spirometry  FEV1 at 59%, ratio 62, FVC 80%.   02/19/15:Follow up hospital admission for asthma exacerbation  Pt. Returns for hospital follow up. He is clinically better. Denies fever or wheezing.He denies any known injury, rash, exertional chest pain, hemoptysis, orthopnea, PND, or increased leg swelling.Patient stated that he has not been taking his Symbicort maintenance as prescribed 12/08/14. We have done significant education today regarding the importance of maintenance medication use and rescue medication use.He continues to smoke, I did counseling for smoking cessation.  Review of Systems    Constitutional:   No  weight loss, night sweats,  Fevers, chills, fatigue, or  lassitude.  HEENT:   No headaches,  Difficulty swallowing,  Tooth/dental problems, or  Sore throat,                No sneezing, itching, ear ache, nasal congestion, post nasal drip,   CV:  No chest pain,  Orthopnea, PND, swelling in lower extremities, anasarca, dizziness, palpitations, syncope.   GI  No heartburn, indigestion, abdominal pain, nausea, vomiting, diarrhea, change in bowel habits, loss of appetite, bloody stools.   Resp: No shortness of breath with exertion or at rest.  No excess mucus, no productive cough,  No non-productive  cough,  No coughing up of blood.  No change in color of mucus.  No wheezing.  No chest wall deformity  Skin: no rash or lesions.  GU: no dysuria, change in color of urine, no urgency or frequency.  No flank pain, no hematuria   MS:  No joint pain or swelling.  No decreased range of motion.  No back pain.  Psych:  No change in mood or affect. No depression or anxiety.  No memory loss.     Objective:   Physical Exam  BP 124/74 mmHg  Pulse 80  Temp(Src) 98 F (36.7 C) (Oral)  Ht 5\' 8"  (1.727 m)  Wt 213 lb (96.616 kg)  BMI 32.39 kg/m2  SpO2 98%  Physical Exam:  General- No distress,  A&Ox3 ENT: No sinus tenderness, TM clear, pale nasal mucosa, no oral exudate,no post nasal drip, no LAN Cardiac: S1, S2, regular rate and rhythm, no murmur Chest: Clear P&A  No wheeze/ rales/ dullness; no accessory muscle use, no nasal flaring, no sternal retractions Abd.: Soft Non-tender Ext: No edema Neuro:  normal strength Skin: No rashes, warm and dry Psych: normal mood and behavior        Assessment & Plan:

## 2015-02-23 NOTE — Progress Notes (Signed)
Reviewed and agree with assessment/plan. 

## 2015-03-31 ENCOUNTER — Ambulatory Visit (INDEPENDENT_AMBULATORY_CARE_PROVIDER_SITE_OTHER): Payer: Medicaid Other | Admitting: Pulmonary Disease

## 2015-03-31 ENCOUNTER — Encounter: Payer: Self-pay | Admitting: Pulmonary Disease

## 2015-03-31 VITALS — BP 122/86 | HR 74 | Ht 68.0 in | Wt 219.4 lb

## 2015-03-31 DIAGNOSIS — F17210 Nicotine dependence, cigarettes, uncomplicated: Secondary | ICD-10-CM

## 2015-03-31 DIAGNOSIS — J45909 Unspecified asthma, uncomplicated: Secondary | ICD-10-CM | POA: Diagnosis not present

## 2015-03-31 DIAGNOSIS — J449 Chronic obstructive pulmonary disease, unspecified: Secondary | ICD-10-CM | POA: Diagnosis not present

## 2015-03-31 DIAGNOSIS — R0602 Shortness of breath: Secondary | ICD-10-CM | POA: Diagnosis not present

## 2015-03-31 DIAGNOSIS — Z720 Tobacco use: Secondary | ICD-10-CM

## 2015-03-31 LAB — PULMONARY FUNCTION TEST
DL/VA % PRED: 115 %
DL/VA: 5.26 ml/min/mmHg/L
DLCO COR % PRED: 98 %
DLCO COR: 29.21 ml/min/mmHg
DLCO unc % pred: 96 %
DLCO unc: 28.62 ml/min/mmHg
FEF 25-75 POST: 1.78 L/s
FEF 25-75 Pre: 2.09 L/sec
FEF2575-%CHANGE-POST: -14 %
FEF2575-%PRED-POST: 40 %
FEF2575-%PRED-PRE: 47 %
FEV1-%CHANGE-POST: -2 %
FEV1-%Pred-Post: 70 %
FEV1-%Pred-Pre: 72 %
FEV1-POST: 3 L
FEV1-Pre: 3.09 L
FEV1FVC-%CHANGE-POST: 1 %
FEV1FVC-%PRED-PRE: 83 %
FEV6-%Change-Post: -4 %
FEV6-%Pred-Post: 83 %
FEV6-%Pred-Pre: 87 %
FEV6-Post: 4.27 L
FEV6-Pre: 4.48 L
FEV6FVC-%Change-Post: 0 %
FEV6FVC-%Pred-Post: 100 %
FEV6FVC-%Pred-Pre: 100 %
FVC-%Change-Post: -4 %
FVC-%PRED-POST: 83 %
FVC-%PRED-PRE: 87 %
FVC-POST: 4.3 L
FVC-PRE: 4.52 L
POST FEV1/FVC RATIO: 70 %
PRE FEV1/FVC RATIO: 68 %
PRE FEV6/FVC RATIO: 99 %
Post FEV6/FVC ratio: 99 %
RV % pred: 123 %
RV: 1.81 L
TLC % PRED: 94 %
TLC: 6.13 L

## 2015-03-31 NOTE — Progress Notes (Signed)
Current Outpatient Prescriptions on File Prior to Visit  Medication Sig  . albuterol (PROVENTIL HFA;VENTOLIN HFA) 108 (90 Base) MCG/ACT inhaler Inhale 1-2 puffs into the lungs every 4 (four) hours as needed for wheezing or shortness of breath (cough).  Marland Kitchen albuterol (PROVENTIL) (2.5 MG/3ML) 0.083% nebulizer solution USE ONE VIAL IN NEBULIZER EVERY 4 HOURS AS NEEDED FOR WHEEZING  . budesonide-formoterol (SYMBICORT) 160-4.5 MCG/ACT inhaler Inhale 2 puffs into the lungs 2 (two) times daily.   No current facility-administered medications on file prior to visit.     Chief Complaint  Patient presents with  . Follow-up    No new complaints. Review PFT.      Tests PFT 03/31/15 >> FEV1 3.09 (72%), FEV1% 68, TLC 6.13 (94%), DLCO 96%, no BD  Past medical hx Asthma  Past surgical hx, Allergies, Family hx, Social hx all reviewed.  Vital Signs BP 122/86 mmHg  Pulse 74  Ht  (1.727 m)  Wt 219 lb 6.4 oz (99.519 kg)  BMI 33.37 kg/m2  SpO2 96%  History of Present Illness Nicholas Brooks is a 30 y.o. male smoker with hx of asthma.  He was in hospital in December 2016 for asthma exacerbation.  He is doing better.  He has been using symbicort BID.  He is not needing albuterol.  He denies cough, wheeze, sputum, or chest congestion.  He is down to 3 cigarettes per day.  His PFT today showed mild obstruction and air trapping.  Physical Exam  General - No distress ENT - No sinus tenderness, no oral exudate, no LAN Cardiac - s1s2 regular, no murmur Chest - No wheeze/rales/dullness Back - No focal tenderness Abd - Soft, non-tender Ext - No edema Neuro - Normal strength Skin - No rashes Psych - normal mood, and behavior   Assessment/Plan  COPD with asthma. Plan: - continue symbicort and prn albuterol  Tobacco abuse. Plan: - discussed options to help with smoking cessation - he will continue to try quitting on his own    Patient Instructions  Follow up in 6  months     Coralyn Helling, MD [place] Pager:  [phone]

## 2015-03-31 NOTE — Patient Instructions (Signed)
Follow up in 6 months 

## 2015-03-31 NOTE — Progress Notes (Signed)
PFT done today. 

## 2015-05-11 ENCOUNTER — Encounter: Payer: Self-pay | Admitting: Pediatrics

## 2015-05-11 DIAGNOSIS — Z8279 Family history of other congenital malformations, deformations and chromosomal abnormalities: Secondary | ICD-10-CM | POA: Insufficient documentation

## 2015-05-27 ENCOUNTER — Telehealth: Payer: Self-pay | Admitting: Pulmonary Disease

## 2015-05-27 NOTE — Telephone Encounter (Signed)
LM x 1 

## 2015-05-31 NOTE — Telephone Encounter (Signed)
LM x 2

## 2015-06-01 NOTE — Telephone Encounter (Signed)
lmtcb X3 for pt.  Will close message per triage protocol.  

## 2015-07-20 ENCOUNTER — Ambulatory Visit (INDEPENDENT_AMBULATORY_CARE_PROVIDER_SITE_OTHER): Payer: Medicaid Other | Admitting: Pediatrics

## 2015-07-20 DIAGNOSIS — Z8279 Family history of other congenital malformations, deformations and chromosomal abnormalities: Secondary | ICD-10-CM

## 2015-07-20 DIAGNOSIS — Q9388 Other microdeletions: Secondary | ICD-10-CM | POA: Diagnosis not present

## 2015-07-20 NOTE — Progress Notes (Addendum)
Pediatric Teaching Program [address] [place]  [place] [address] [phone] FAX [phone]  Nicholas Brooks DOB: 19-Dec-1985 Date of Evaluation: July 20, 2015  [facility] of Nicholas Brooks is a 30 year old male seen in the [facility] along with his 30 year old son, Nicholas Brooks.  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  Nicholas Brooks had genetic testing during Nicholas Brooks's first evaluation in the [facility] clinic in March 2017.  This test, a whole genomic microarray study. has resulted and shows a the same microdeletion that is present in some members of the family.    arr [id number] Male Abnormal Microarray Result  Microarray analysis detected an alteration in Nicholas Brooks' DNA sample using the CytoScanHD array manufactured by [employer]. which includes approximately 2.7 million markers (0,962,836 target non-polymorphic sequences and 743,304 SNPs) evenly spaced across the entire human genome. This alteration is characterized by a single copy loss of 1423 markers from within the long arm of chromosome 1 at bands q21.1-q21.2 (nucleotide positions chr1:145,885,[id number],963 based on the GRCh37/hg19 human genome build). The size of this loss is approximately 2.6 Mb based on the nearest proximal and distal markers that show a loss.  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 This deletion contains at least 20 genes including: GPR89C, PDZK1P1, NBPF11, NBPF24, [id number], PRKAB2, PDIA3P, FMO5, CHD1L, BCL9, ACP6, GJA5, GJA8, GPR89B, [id number], PPIAL4B, PPIAL4A, NBPF14, PPIAL4F, and PPIAL4D. This deletion is larger but includes the region associated with Chromosome 1q21.1 Deletion syndrome  (CLEX#[id number]) in which individuals may present with intellectual disability, developmental delay, and dysmorphic facial features. Parental FISH analysis is recommended to clarify whether this alteration was de novo or inherited from a parent. If parental analysis is desired, please submit a peripheral blood specimen from each parent collected in sodium heparin (5cc blood). An addended report will be issued when the parental analysis is completed. Genetic counseling is recommended     Nicholas Brooks has a history of learning difficulties as a child. Nicholas Brooks reports having an IEP in school and required speech therapy. Nicholas Brooks reports that Nicholas Brooks did no have head growth or other growth difficulties as a child. Nicholas Brooks reports needing jaw surgery for an overbite at 30 years of age.   There is a history of intermittent asthma which is treated with albuterol. Nicholas Brooks has been a cigarette smoker.   There was a hospitalization late last year for SIRS.  In addition, there have been EKG's Hypokalemia was a diagnosis made during that admission. However, the EKG on 12/25/2-16 is reported as abnormal.    Physical Examination:  Head/facies    HC 75th centile; normally shaped head with low anterior hairline.   Eyes PERRL, no nystagmus; slightly deep set eyes.   Ears Ears are normally formed   Mouth Normal dentition  Neck No thyromegaly  Musculoskeletal No contractures, no syndactyly or polydactyly  Neuro Normal tone and strength; no tremor.   Skin/Integument No unusual skin lesions.    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  This information was discussed with Nicholas Brooks today and helps inform the need to test his 47 year old son, Nicholas Brooks.  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   Genetic counselor, Nicholas Brooks, genetic counseling student, Nicholas Brooks, and I have  reviewed the result with Nicholas Brooks.  Medical evaluation of an individual with the chromosome 1q21.1 deletion are as follows:   "Clinical phenotype of the recurrent 1q21.1 copy-number variant" has much more detailed information about the characteristics seen in people with a 1q21.1 CNV along with medical management suggestions. These suggestions of what the medical evaluation for someone with a 1q21.1 CNV should include are:  1. Psychiatric and neurologic evaluations at several points throughout life: childhood, adolescence, and adulthood.  2. Evaluation by a developmental pediatrician at a young age for autism spectrum disorder (ASD), intellectual disability, attention deficit hyperactivity disorder (ADHD), motor difficulties.  3. Hearing screening as part of well-child visits during childhood, as there were a greater proportion of children with hearing issues  4. Evaluation for both structural and rhythmic heart abnormalities with an echocardiogram   Several reported individuals with the 1q21.1 microdeletion have cardiac defects, including patent ductus arteriosus, truncus arteriosus, ventricular and atrial septal defects, tetralogy of Fallot, bicuspid aortic valve, dilation of ascending aorta, aortic insufficiency, coarctation of the aorta, interrupted aortic arch, anomalous origin of the right coronary artery, pulmonary valve stenosis, and transposition of the great vessels in individuals with 1q21.1 deletions [Digilio et al 2013].   Intrafamilial variability has been described in the literature.  A very good resource that has been discussed with the family is the [employer] that studies individuals with the chromosome 1q21.1 deletion syndrome. www.simonsvipconnect.org  RECOMMENDATIONS:  I recommend a formal cardiology evaluation for Nicholas Brooks that would include an EKG and echocardiogram.  We will determine Nicholas Brooks's plan for a primary care physician.      York Grice,  M.D., Ph.D. Clinical Professor, Pediatrics and Medical Genetics

## 2015-08-10 ENCOUNTER — Other Ambulatory Visit: Payer: Self-pay | Admitting: Pulmonary Disease

## 2015-08-10 MED ORDER — ALBUTEROL SULFATE HFA 108 (90 BASE) MCG/ACT IN AERS: 1.0000 | INHALATION_SPRAY | RESPIRATORY_TRACT | Status: DC | PRN

## 2015-08-10 NOTE — Telephone Encounter (Signed)
Called spoke with pt. He is needing refill on albuterol rescue inhaler. I have sent this in.  He is also requesting to have nicotine patches called in for him to help stop smoking. Please advise Dr. Craige CottaSood thanks

## 2015-08-12 NOTE — Telephone Encounter (Signed)
VS please advise 

## 2015-08-16 ENCOUNTER — Encounter (HOSPITAL_BASED_OUTPATIENT_CLINIC_OR_DEPARTMENT_OTHER): Payer: Self-pay | Admitting: *Deleted

## 2015-08-16 ENCOUNTER — Emergency Department (HOSPITAL_BASED_OUTPATIENT_CLINIC_OR_DEPARTMENT_OTHER)
Admission: EM | Admit: 2015-08-16 | Discharge: 2015-08-16 | Disposition: A | Discharge status: Home / Self Care | Payer: Medicaid Other | Attending: Emergency Medicine | Admitting: Emergency Medicine

## 2015-08-16 DIAGNOSIS — J45909 Unspecified asthma, uncomplicated: Secondary | ICD-10-CM | POA: Insufficient documentation

## 2015-08-16 DIAGNOSIS — L299 Pruritus, unspecified: Secondary | ICD-10-CM | POA: Insufficient documentation

## 2015-08-16 DIAGNOSIS — R21 Rash and other nonspecific skin eruption: Secondary | ICD-10-CM | POA: Diagnosis present

## 2015-08-16 DIAGNOSIS — Z79899 Other long term (current) drug therapy: Secondary | ICD-10-CM | POA: Insufficient documentation

## 2015-08-16 DIAGNOSIS — F17210 Nicotine dependence, cigarettes, uncomplicated: Secondary | ICD-10-CM | POA: Insufficient documentation

## 2015-08-16 HISTORY — DX: Unspecified asthma, uncomplicated: J45.909

## 2015-08-16 MED ORDER — HYDROXYZINE HCL 50 MG PO TABS: 50.0000 mg | ORAL_TABLET | Freq: Four times a day (QID) | ORAL | Status: DC | PRN

## 2015-08-16 NOTE — ED Notes (Signed)
Rash for months. States it could be mosquitos vs stress.

## 2015-08-16 NOTE — Discharge Instructions (Signed)
Pruritus Pruritus is an itching feeling. There are many different conditions and factors that can make your skin itchy. Dry skin is one of the most common causes of itching. Most cases of itching do not require medical attention. Itchy skin can turn into a rash.  HOME CARE INSTRUCTIONS  Watch your pruritus for any changes. Take these steps to help with your condition:  Skin Care  Moisturize your skin as needed. A moisturizer that contains petroleum jelly is best for keeping moisture in your skin.  Take or apply medicines only as directed by your health care provider. This may include:  Corticosteroid cream.  Anti-itch lotions.  Oral anti-histamines.  Apply cool compresses to the affected areas.  Try taking a bath with:  Epsom salts. Follow the instructions on the packaging. You can get these at your local pharmacy or grocery store.  Baking soda. Pour a small amount into the bath as directed by your health care provider.  Colloidal oatmeal. Follow the instructions on the packaging. You can get this at your local pharmacy or grocery store.  Try applying baking soda paste to your skin. Stir water into baking soda until it reaches a paste-like consistency.   Do not scratch your skin.  Avoid hot showers or baths, which can make itching worse. A cold shower may help with itching as long as you use a moisturizer after.  Avoid scented soaps, detergents, and perfumes. Use gentle soaps, detergents, perfumes, and other cosmetic products. General Instructions  Avoid wearing tight clothes.  Keep a journal to help track what causes your itch. Write down:  What you eat.  What cosmetic products you use.  What you drink.  What you wear. This includes jewelry.  Use a humidifier. This keeps the air moist, which helps to prevent dry skin. SEEK MEDICAL CARE IF:  The itching does not go away after several days.  You sweat at night.  You have weight loss.  You are unusually  thirsty.  You urinate more than normal.  You are more tired than normal.  You have abdominal pain.  Your skin tingles.  You feel weak.  Your skin or the whites of your eyes look yellow (jaundice).  Your skin feels numb.   This information is not intended to replace advice given to you by your health care provider. Make sure you discuss any questions you have with your health care provider.   Document Released: 10/12/2010 Document Revised: 06/16/2014 Document Reviewed: 01/26/2014 Elsevier Interactive Patient Education 2016 [employer] Substance Abuse Treatment Programs  Intensive Outpatient Programs [facility]     [address]      [place], [place]                   [phone]       [facility] [address] [place], [place] [phone]  [facility]     (Inpatient and outpatient)     [address]           [phone]    [facility] [phone] (Suboxone and Methadone)  [address]      [place], [place] [address]      [phone]       [address] [place], [place] [phone]  [facility] (Outpatient/Inpatient, Chemical)    (insurance only) [phone]             [facility] (Groups & Residential) High  [place], [place] [phone]     [facility]     [address]     [place], [place]      [phone]       [facility] (for caregivers and family) [phone] [address]urell [address] [place], [place] [phone]      Residential Treatment Programs [facility]      [address], [place], [place] [address]  [phone]       [facility] [address] [place], [place] [address] [phone]  [facility] [place]        [phone]       [facility] [phone]  [facility] ([facility]             [address]                                         [place], [place]                                                 [phone] or [phone]                               [facility] [address] [place], [address] [phone]  [facility]    [address]      [place], [place]     [phone]       [facility] [address] [place], [place] [phone]  [facility]   [address] [place], [place] [address]     [phone]      Admissions: 8am-3pm M-F  [facility] Services (RTS) [address] [place], [place] [phone]  [facility]: Residential Program [phone] Days)   [place], [place]      [phone] or [phone]     ADATC: [facility] [place], [place] (Walk in Hours over the weekend or by referral)  [facility] [address] [place], [place], [place] [address] [phone]  Crisis Mobile: [facility]:  [phone] (for crisis response 24 hours a day) [facility] Hotline:      [phone] Outpatient Psychiatry and Counseling  [facility]: Mobile Crisis Management 24 hours:  [phone]  [facility] of the [employer] sliding scale fee and walk in schedule: M-F 8am-12pm/1pm-3pm [address]  [place], [place] [address] [phone]  [facility] [address] [place], [place] [address] [phone]  [facility] (Formerly known as [facility] [employer])- new patient walk-in appointments available Monday - Friday 8am -3pm.          [address] [place], [place] [address] [phone] or crisis line- [phone]  [facility] Out[facility] Services/ Intensive Outpatient Therapy Program [address] [place], [place] [address] [phone]  [facility]                  Crisis Services      [id number] [address]     [place], [place] [address]                 [facility]  [facility] [phone] [address] [place], [place] [address]   [facility]          [address] [place],  [place], [place] [address]       [phone]  [facility] [address] [place], [place] [address] [phone]  [facility]    [address]    [place], [place] [address]     [phone]       Andee PolesParish McKinney, MD     [address]     [place] [address]     [phone]       [facility] [address] [place] [address]  [facility]     [address]     [place], [place]      [phone]       [facility] Eulogio DitchShamsher Ahluwalia, MD [address] [place], [place] [address] [phone]  [facility]     [address]     [place], [place] [address]     [phone]       [facility] [address] [place], [place] [address] [phone] Resources for Temporary Residential Assistance/Crisis Centers  [facility]ENT[facility]C) M-F 8am-3pm   [address] [place], [place] [address]   [phone] Services include: laundry, barbering, support groups, case management, phone  & computer access, showers, AA/NA mtgs, mental health/substance abuse nurse, job skills class, disability information, VA assistance, spiritual classes, etc.   HOMELESS SHELTERS  [facility]     [facility]   [address], [place]     [phone]              [facility] (women and children)       [address] [place], [place] [address] [phone] [email or web address] for application and process Application Required  [facility]   [address]    [place] [address]     [phone]                    [facility] [address] [place], [place] [address] [phone](schedule  application appt.) Application Required  [facility] (women only)    [address]     [place], [place] [address]     [phone]      Intake starts 6pm daily Need valid ID, SSC, & Police report [facility] [place] [address] [place], [place] [phone] Application Required  [facility] (men only)     [address]      [place], [place]     [phone]       [facility] (Pregnant women only) [address] [place], [place] [phone]  [facility]      [address]      [place], [place] [address]     [phone]             [facility] [address] [place], [place] [phone] 90 day commitment/SA/Application process  [facility](men only)     [address]     [place], [place]     [phone]       Check-in at 7pm  C[facility] [address] [place], [place] [address] [phone] Men/Women/Women and Children must be there by 7 pm  [place], [place]

## 2015-08-16 NOTE — ED Provider Notes (Signed)
CSN: [id number]     Arrival date & time 08/16/15  1900 History  By signing my name below, I, Levon HedgerElizabeth Hall, attest that this documentation has been prepared under the direction and in the presence of Gwyneth SproutWhitney Moet Mikulski, MD . Electronically Signed: Levon HedgerElizabeth Hall, Scribe. 08/16/2015. 9:12 PM.    Chief Complaint  Patient presents with  . Rash   The history is provided by the patient. No language interpreter was used.   HPI Comments:  Nicholas Brooks is a 30 y.o. male with PMHx of psoriasis who presents to the Emergency Department complaining of constant, itching rash on bilateral legs onset several months ago. He is unsure why he has the rash, but says he has been around poison oak and mosquitos. He also states no one in his house has a rash. Per pt, he has been experiencing depression and thinks the rash could be from stress and depression. Pt also mentioned he is concerned that he has an STI. He states he has rash on his penis and is concerned that it could be an infection. He is currently separated from his wife and is not sure if she is having sexual contact with anyone else. No other complaints noted. No alleviating factors noted.  Past Medical History  Diagnosis Date  . Asthma    Past Surgical History  Procedure Laterality Date  . Mandible surgery    . Wisdom tooth extraction     Family History  Problem Relation Age of Onset  . Cancer Maternal Grandmother     colon  . Asthma Sister    Social History  Substance Use Topics  . Smoking status: Current Some Day Smoker -- 0.25 packs/day for 8 years    Types: Cigarettes    Start date: 02/14/2003  . Smokeless tobacco: Never Used     Comment: TRYING TO QUIT   . Alcohol Use: No    Review of Systems  Constitutional: Negative for fever.  Skin: Positive for rash.  Psychiatric/Behavioral: Positive for dysphoric mood.  All other systems reviewed and are negative.   Allergies  Review of patient's allergies indicates no known  allergies.  Home Medications   Prior to Admission medications   Medication Sig Start Date End Date Taking? Authorizing Provider  albuterol (PROVENTIL HFA;VENTOLIN HFA) 108 (90 Base) MCG/ACT inhaler Inhale 1-2 puffs into the lungs every 4 (four) hours as needed for wheezing or shortness of breath (cough). 08/10/15  Yes Coralyn HellingVineet Sood, MD  albuterol (PROVENTIL) (2.5 MG/3ML) 0.083% nebulizer solution USE ONE VIAL IN NEBULIZER EVERY 4 HOURS AS NEEDED FOR WHEEZING 02/10/15  Yes Alison MurrayAlma M Devine, MD  budesonide-formoterol [facility](SYMBICORT) 160-4.5 MCG/ACT inhaler Inhale 2 puffs into the lungs 2 (two) times daily. 02/10/15  Yes Alison MurrayAlma M Devine, MD   BP 127/93 mmHg  Pulse 84  Temp(Src) 98.2 F (36.8 C) (Oral)  Resp 20  Ht 5\' 8"  (1.727 m)  Wt 219 lb (99.338 kg)  BMI 33.31 kg/m2  SpO2 100% Physical Exam  Constitutional: He is oriented to person, place, and time. He appears well-developed and well-nourished. No distress.  HENT:  Head: Normocephalic and atraumatic.  Right Ear: Hearing normal.  Left Ear: Hearing normal.  Nose: Nose normal.  Mouth/Throat: Oropharynx is clear and moist and mucous membranes are normal.  Eyes: Conjunctivae and EOM are normal. Pupils are equal, round, and reactive to light.  Neck: Normal range of motion. Neck supple.  Cardiovascular: Normal rate, regular rhythm, S1 normal and S2 normal.  Exam reveals no gallop  and no friction rub.   No murmur heard. Pulmonary/Chest: Effort normal and breath sounds normal. No respiratory distress. He exhibits no tenderness.  Abdominal: Soft. Normal appearance and bowel sounds are normal. He exhibits no distension. There is no hepatosplenomegaly. There is no tenderness. There is no rebound, no guarding, no tenderness at McBurney's point and negative Murphy's sign. No hernia.  Musculoskeletal: Normal range of motion.  Neurological: He is alert and oriented to person, place, and time. He has normal strength. No cranial nerve deficit or sensory deficit.  Coordination normal. GCS eye subscore is 4. GCS verbal subscore is 5. GCS motor subscore is 6.  Skin: Skin is warm, dry and intact. Rash noted. No cyanosis.  Areas of excoriation over bilateral thighs without legions present Circular patch of psoriasis present over right knee and left ankle Mild folliculitis over abdomen  Psychiatric: He has a normal mood and affect. His speech is normal and behavior is normal. Thought content normal.  Nursing note and vitals reviewed.   ED Course  Procedures  DIAGNOSTIC STUDIES:  Oxygen Saturation is 100% on RA, normal by my interpretation.    COORDINATION OF CARE:  8:55 PM Will order RPR and HIV antibody. Ordered hydroxyzine.  Discussed treatment plan with pt at bedside and pt agreed to plan.  Labs Review Labs Reviewed  HIV ANTIBODY (ROUTINE TESTING)  RPR  GC/CHLAMYDIA PROBE AMP ([place]    Imaging Review No results found. I have personally reviewed and evaluated these images and lab results as part of my medical decision-making.   EKG Interpretation None      MDM   Final diagnoses:  Pruritus    Patient presenting today with pruritus for 1-2 months. He does not know if his being caused by stress or an allergic reaction. He also states that sometimes it is in his genital area and he does not know male have an STI. He denies any penile discharge, testicular pain or swelling. Areas where patient is complaining of rash just appear to be excoriated skin without significant lesion. Feel most likely that it could be related to stress but there is no evidence of contact dermatitis or allergic reaction at this time. Patient was tested for HIV, syphilis and GC chlamydia given it is unclear if his wife has more than 1 partner.  Patient was given hydroxyzine for the itching.  I personally performed the services described in this documentation, which was scribed in my presence.  The recorded information has been reviewed and  considered.     Gwyneth SproutWhitney Virdell Hoiland, MD 08/16/15 2348

## 2015-08-18 LAB — GC/CHLAMYDIA PROBE AMP (~~LOC~~) NOT AT ARMC
Chlamydia: NEGATIVE
NEISSERIA GONORRHEA: NEGATIVE

## 2015-08-18 LAB — HIV ANTIBODY (ROUTINE TESTING W REFLEX): HIV SCREEN 4TH GENERATION: NONREACTIVE

## 2015-08-19 LAB — RPR: RPR: NONREACTIVE

## 2015-08-23 NOTE — Telephone Encounter (Signed)
Patient returned call, CB is [phone].

## 2015-08-23 NOTE — Telephone Encounter (Signed)
As indicated in my initial response from 2:31 pm today, please send script for nicotine patch 14 mg topical daily.

## 2015-08-23 NOTE — Telephone Encounter (Signed)
lmomtcb x1 

## 2015-08-23 NOTE — Telephone Encounter (Signed)
Called and spoke with pt and he stated that he is needing the nicotine patches sent in to his pharmacy.  Have used these in the past.  Would like these sent in to the pharmacy so he can stop smoking.  VS please advise of dose of patches that may be sent in for the pt.  Thanks  No Known Allergies

## 2015-08-23 NOTE — Telephone Encounter (Signed)
lmtcb x1 

## 2015-08-23 NOTE — Telephone Encounter (Signed)
Can send script for nicotine patch 14 mg topical daily.

## 2015-08-24 NOTE — Telephone Encounter (Signed)
lmtcb X2 for pt.  

## 2015-08-25 MED ORDER — NICOTINE 14 MG/24HR TD PT24: 14.0000 mg | MEDICATED_PATCH | Freq: Every day | TRANSDERMAL | Status: DC

## 2015-08-25 NOTE — Telephone Encounter (Signed)
Spoke with pt. Rx has been sent in. Nothing further was needed. 

## 2015-08-25 NOTE — Telephone Encounter (Signed)
lmtcb x 3 for the pt.

## 2015-09-25 ENCOUNTER — Encounter (HOSPITAL_COMMUNITY): Payer: Self-pay | Admitting: Emergency Medicine

## 2015-09-25 ENCOUNTER — Telehealth (HOSPITAL_BASED_OUTPATIENT_CLINIC_OR_DEPARTMENT_OTHER): Payer: Self-pay

## 2015-09-25 ENCOUNTER — Emergency Department (HOSPITAL_COMMUNITY)
Admission: EM | Admit: 2015-09-25 | Discharge: 2015-09-25 | Disposition: A | Discharge status: Home / Self Care | Payer: Medicaid Other | Attending: Emergency Medicine | Admitting: Emergency Medicine

## 2015-09-25 DIAGNOSIS — Z7951 Long term (current) use of inhaled steroids: Secondary | ICD-10-CM | POA: Diagnosis not present

## 2015-09-25 DIAGNOSIS — F17210 Nicotine dependence, cigarettes, uncomplicated: Secondary | ICD-10-CM | POA: Insufficient documentation

## 2015-09-25 DIAGNOSIS — Z202 Contact with and (suspected) exposure to infections with a predominantly sexual mode of transmission: Secondary | ICD-10-CM | POA: Diagnosis not present

## 2015-09-25 DIAGNOSIS — L299 Pruritus, unspecified: Secondary | ICD-10-CM | POA: Diagnosis present

## 2015-09-25 DIAGNOSIS — Z79899 Other long term (current) drug therapy: Secondary | ICD-10-CM | POA: Diagnosis not present

## 2015-09-25 DIAGNOSIS — J45901 Unspecified asthma with (acute) exacerbation: Secondary | ICD-10-CM | POA: Insufficient documentation

## 2015-09-25 MED ORDER — AZITHROMYCIN 600 MG PO TABS
1200.0000 mg | ORAL_TABLET | Freq: Once | ORAL | Status: AC
  Administered 2015-09-25: 1200 mg via ORAL
  Filled 2015-09-25: qty 2

## 2015-09-25 MED ORDER — CEFTRIAXONE SODIUM 250 MG IJ SOLR
250.0000 mg | Freq: Once | INTRAMUSCULAR | Status: AC
  Administered 2015-09-25: 250 mg via INTRAMUSCULAR
  Filled 2015-09-25: qty 250

## 2015-09-25 MED ORDER — STERILE WATER FOR INJECTION IJ SOLN
INTRAMUSCULAR | Status: AC
  Administered 2015-09-25: 1 mL
  Filled 2015-09-25: qty 10

## 2015-09-25 MED ORDER — METRONIDAZOLE 500 MG PO TABS
2000.0000 mg | ORAL_TABLET | Freq: Once | ORAL | Status: AC
Start: 2015-09-25 — End: 2015-09-25
  Administered 2015-09-25: 2000 mg via ORAL
  Filled 2015-09-25: qty 4

## 2015-09-25 NOTE — ED Provider Notes (Signed)
Emergency Department Provider Note   I have reviewed the triage vital signs and the nursing notes.   HISTORY  Chief Complaint Exposure to STD and Pruritis   HPI Nicholas Brooks is a 30 y.o. male with PMH of asthma presents to the emergency department for evaluation of concern for exposure to sexual transmitted infection and lower extremity itching. Patient states that the had an unprotected sexual encounter in the last month and was recently called by that individual to say that she had contracted Trichomonas. The patient denies any specific symptoms such as dysuria, urethral discharge, or fever. He does have significant total body itching and has been scratching his legs. He has tried lotion on the legs but this has not improved his symptoms. He also has a small rash on the head of his penis. He states it is not painful, draining, and not itching. No abdominal pain, difficult breathing or chest pain.   Past Medical History:  Diagnosis Date  . Asthma     Patient Active Problem List   Diagnosis Date Noted  . 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 Family history of congenital or genetic condition 05/11/2015  . Smokes 1/2 pack/day or less 02/19/2015  . Asthma exacerbation 02/07/2015  . Acute respiratory failure with hypoxia ([place]) 02/07/2015  . Hypokalemia 02/07/2015  . Leukocytosis 02/07/2015  . SIRS (systemic inflammatory response syndrome) ([place]) 02/07/2015    Past Surgical History:  Procedure Laterality Date  . MANDIBLE SURGERY    . WISDOM TOOTH EXTRACTION      Current Outpatient Rx  . Order #: [id number] Class: Normal  . Order #: [id number] Class: Print  . Order #: [id number] Class: Print  . Order #: [id number] Class: Print  . Order #: [id number] Class: Normal    Allergies Review of patient's allergies indicates no known allergies.  Family History  Problem Relation Age of Onset  . Cancer Maternal Grandmother     colon  . Asthma Sister     Social  History Social History  Substance Use Topics  . Smoking status: Current Some Day Smoker    Packs/day: 0.25    Years: 8.00    Types: Cigarettes    Start date: 02/14/2003  . Smokeless tobacco: Never Used     Comment: TRYING TO QUIT   . Alcohol use No    Review of Systems  Constitutional: No fever/chills Eyes: No visual changes. ENT: No sore throat. Cardiovascular: Denies chest pain. Respiratory: Denies shortness of breath. Gastrointestinal: No abdominal pain.  No nausea, no vomiting.  No diarrhea.  No constipation. Genitourinary: Negative for dysuria or urethral discharge.  Musculoskeletal: Negative for back pain. Skin: Itching rash over leg and lesion on penis.  Neurological: Negative for headaches, focal weakness or numbness.  10-point ROS otherwise negative.  ____________________________________________   PHYSICAL EXAM:  VITAL SIGNS: ED Triage Vitals [09/25/15 0919]  Enc Vitals Group     BP 127/71     Pulse Rate 83     Resp 18     Temp 98.1 F (36.7 C)     Temp Source Oral     SpO2 95 %   Constitutional: Alert and oriented. Well appearing and in no acute distress. Eyes: Conjunctivae are normal. PERRL. EOMI. Head: Atraumatic. Nose: No congestion/rhinnorhea. Mouth/Throat: Mucous membranes are moist.  Oropharynx non-erythematous. Neck: No stridor.   Cardiovascular: Normal rate, regular rhythm. Good peripheral circulation. Grossly normal heart sounds.   Respiratory: Normal respiratory effort.  No retractions. Lungs CTAB. Gastrointestinal: Soft and nontender. No distention.  Genitourinary: Faint erythematous lesion on glans. No drainage. Non-painful. No urethral discharge. Normal testicular exam.  Musculoskeletal: No lower extremity tenderness nor edema. No gross deformities of extremities. Neurologic:  Normal speech and language. No gross focal neurologic deficits are appreciated.  Skin:  Skin is warm, dry and intact. Multiple excoriated area over bilateral LE.    Psychiatric: Mood and affect are normal. Speech and behavior are normal.  ____________________________________________   LABS (all labs ordered are listed, but only abnormal results are displayed)  Labs Reviewed  HIV ANTIBODY (ROUTINE TESTING)  RPR  GC/CHLAMYDIA PROBE AMP ([place]) NOT AT [facility]   ____________________________________________  RADIOLOGY  None ____________________________________________   PROCEDURES  Procedure(s) performed:   Procedures  None ____________________________________________   INITIAL IMPRESSION / ASSESSMENT AND PLAN / ED COURSE  Pertinent labs & imaging results that were available during my care of the patient were reviewed by me and considered in my medical decision making (see chart for details).  Patient resents to the emergency department for evaluation of concern for exposure to sexual transmitted infection and lower extremity itching with excoriations. To determine the etiology of the patient's itching. He has no jaundice or right upper quadrant discomfort to suggest elevated bilirubin or biliary obstructive process. The lesions are excoriated and is difficult to determine the original presentation of the lesions. The patient has associated psoriasis. The lesion on the penis is not consistent with genital herpes or syphilis. Plan to treat for Trichomonas given possible exposure along with gonorrhea and Chlamydia. Discussed barrier protection with the patient. Also sent HIV and syphilis screening labs. Advised that the patient take Benadryl and hydrocortisone cream over the lesions on his legs. Will follow with his primary care physician.   At this time, I do not feel there is any life-threatening condition present. I have reviewed and discussed all results (EKG, imaging, lab, urine as appropriate), exam findings with patient. I have reviewed nursing notes and appropriate previous records.  I feel the patient is safe to be discharged home  without further emergent workup. Discussed usual and customary return precautions. Patient and family (if present) verbalize understanding and are comfortable with this plan.  Patient will follow-up with their primary care provider. If they do not have a primary care provider, information for follow-up has been provided to them. All questions have been answered.    ____________________________________________  FINAL CLINICAL IMPRESSION(S) / ED DIAGNOSES  Final diagnoses:  STD exposure  Itching     MEDICATIONS GIVEN DURING THIS VISIT:  Medications  azithromycin (ZITHROMAX) tablet 1,200 mg (not administered)  cefTRIAXone (ROCEPHIN) injection 250 mg (not administered)  metroNIDAZOLE (FLAGYL) tablet 2,000 mg (not administered)     NEW OUTPATIENT MEDICATIONS STARTED DURING THIS VISIT:  None   Note:  This document was prepared using Dragon voice recognition software and may include unintentional dictation errors.  Nanda Quinton, MD Emergency Medicine   Margette Fast, MD 09/25/15 [phone]

## 2015-09-25 NOTE — Discharge Instructions (Signed)
You have been seen today in the Emergency Department (ED) for STD exposure.  Your workup today shows that you may have one or more sexually transmitted infections (STIs).  You have been treated with a one time dose medication for both of these conditions.  Please have your partner tested for STD's and do not resume sexual activity until you and your partner have confirmed negative test results or have both been treated.  Please follow up with your doctor as soon as possible regarding today?s ED visit and your symptoms.   Return to the ED if your pain worsens, you develop a fever, or for any other symptoms that concern you.

## 2015-09-25 NOTE — ED Triage Notes (Signed)
Patient states that he got text message from a past sexual partner stating that she was recently diagnosed and being treated for Trich and he needs to be treated as well.  Patient c/o of itching in genitals and entire body.  Patient states that he has been putting lotion, proxide and aloe on spots to help with itching.  Patient denies any pain or penile discharge.

## 2015-09-26 LAB — RPR: RPR Ser Ql: NONREACTIVE

## 2015-09-26 LAB — HIV ANTIBODY (ROUTINE TESTING W REFLEX): HIV SCREEN 4TH GENERATION: NONREACTIVE

## 2015-09-27 LAB — GC/CHLAMYDIA PROBE AMP (~~LOC~~) NOT AT ARMC
Chlamydia: NEGATIVE
Neisseria Gonorrhea: NEGATIVE

## 2015-10-28 ENCOUNTER — Ambulatory Visit: Payer: Medicaid Other | Admitting: Pulmonary Disease

## 2015-11-20 ENCOUNTER — Encounter (HOSPITAL_BASED_OUTPATIENT_CLINIC_OR_DEPARTMENT_OTHER): Payer: Self-pay | Admitting: Emergency Medicine

## 2015-11-20 ENCOUNTER — Emergency Department (HOSPITAL_BASED_OUTPATIENT_CLINIC_OR_DEPARTMENT_OTHER)
Admission: EM | Admit: 2015-11-20 | Discharge: 2015-11-20 | Disposition: A | Discharge status: Home / Self Care | Payer: Medicaid Other | Attending: Emergency Medicine | Admitting: Emergency Medicine

## 2015-11-20 DIAGNOSIS — Y929 Unspecified place or not applicable: Secondary | ICD-10-CM | POA: Insufficient documentation

## 2015-11-20 DIAGNOSIS — S50862A Insect bite (nonvenomous) of left forearm, initial encounter: Secondary | ICD-10-CM | POA: Insufficient documentation

## 2015-11-20 DIAGNOSIS — F17210 Nicotine dependence, cigarettes, uncomplicated: Secondary | ICD-10-CM | POA: Diagnosis not present

## 2015-11-20 DIAGNOSIS — Y999 Unspecified external cause status: Secondary | ICD-10-CM | POA: Insufficient documentation

## 2015-11-20 DIAGNOSIS — Y939 Activity, unspecified: Secondary | ICD-10-CM | POA: Diagnosis not present

## 2015-11-20 DIAGNOSIS — J45909 Unspecified asthma, uncomplicated: Secondary | ICD-10-CM | POA: Diagnosis not present

## 2015-11-20 DIAGNOSIS — W57XXXA Bitten or stung by nonvenomous insect and other nonvenomous arthropods, initial encounter: Secondary | ICD-10-CM | POA: Diagnosis not present

## 2015-11-20 MED ORDER — SULFAMETHOXAZOLE-TRIMETHOPRIM 800-160 MG PO TABS: 1.0000 | ORAL_TABLET | Freq: Two times a day (BID) | ORAL | 0 refills | Status: DC

## 2015-11-20 MED ORDER — CEPHALEXIN 500 MG PO CAPS: 500.0000 mg | ORAL_CAPSULE | Freq: Three times a day (TID) | ORAL | 0 refills | Status: DC

## 2015-11-20 MED ORDER — IBUPROFEN 400 MG PO TABS
600.0000 mg | ORAL_TABLET | Freq: Once | ORAL | Status: AC
  Administered 2015-11-20: 600 mg via ORAL
  Filled 2015-11-20: qty 1

## 2015-11-20 MED ORDER — SULFAMETHOXAZOLE-TRIMETHOPRIM 800-160 MG PO TABS
1.0000 | ORAL_TABLET | Freq: Once | ORAL | Status: AC
  Administered 2015-11-20: 1 via ORAL
  Filled 2015-11-20: qty 1

## 2015-11-20 MED ORDER — CEPHALEXIN 250 MG PO CAPS
500.0000 mg | ORAL_CAPSULE | Freq: Once | ORAL | Status: AC
  Administered 2015-11-20: 500 mg via ORAL
  Filled 2015-11-20: qty 2

## 2015-11-20 NOTE — Discharge Instructions (Signed)
Take antibiotics as prescribed. Return in 48 hrs for re-check. Come back sooner if the redness starts spreading much past the marked line we drew or for any new/worsening symptoms.

## 2015-11-20 NOTE — ED Triage Notes (Signed)
Pt in with large swollen and red area to L forearm onset yesterday, states has noted pus coming from it. States it was itching and could be spider bite but unsure. Pt alert, interactive, ambulatory in NAD.

## 2015-11-20 NOTE — ED Notes (Signed)
Wound to RFA marked with skin marker, around circumference of wound

## 2015-11-20 NOTE — ED Notes (Signed)
Patient denies pain and is resting comfortably.  

## 2015-11-20 NOTE — ED Provider Notes (Signed)
[facility] Provider Note   CSN: [id number] Arrival date & time: 11/20/15  1738  By signing my name below, I, Jeanell Sparrow, attest that this documentation has been prepared under the direction and in the presence of non-physician practitioner, Anne Ng, PA-C. Electronically Signed: Jeanell Sparrow, Scribe. 11/20/2015. 5:50 PM.  History   Chief Complaint Chief Complaint  Patient presents with  . Abscess   The history is provided by the patient. No language interpreter was used.   HPI Comments: Nicholas Brooks is a 30 y.o. male who presents to the Emergency Department complaining of a constant moderate LUE insect bite with redness that started yesterday. Pt states he did not witness an insect bite but has been within forested areas lately. He reports itchiness, redness, swelling to the affected area. States this morning he was able to squeeze a little bit of white pus from the area. Pt denies any other complaints.   Past Medical History:  Diagnosis Date  . Asthma     Patient Active Problem List   Diagnosis Date Noted  . 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 Family history of congenital or genetic condition 05/11/2015  . Smokes 1/2 pack/day or less 02/19/2015  . Asthma exacerbation 02/07/2015  . Acute respiratory failure with hypoxia ([place]) 02/07/2015  . Hypokalemia 02/07/2015  . Leukocytosis 02/07/2015  . SIRS (systemic inflammatory response syndrome) ([place]) 02/07/2015    Past Surgical History:  Procedure Laterality Date  . MANDIBLE SURGERY    . WISDOM TOOTH EXTRACTION         Home Medications    Prior to Admission medications   Medication Sig Start Date End Date Taking? Authorizing Provider  albuterol (PROVENTIL HFA;VENTOLIN HFA) 108 (90 Base) MCG/ACT inhaler Inhale 1-2 puffs into the lungs every 4 (four) hours as needed for wheezing or shortness of breath (cough). 08/10/15   [name], MD  albuterol (PROVENTIL) (2.5 MG/3ML) 0.083%  nebulizer solution USE ONE VIAL IN NEBULIZER EVERY 4 HOURS AS NEEDED FOR WHEEZING 02/10/15   [name], MD  budesonide-formoterol [facility]) 160-4.5 MCG/ACT inhaler Inhale 2 puffs into the lungs 2 (two) times daily. 02/10/15   [name], MD  hydrOXYzine (ATARAX/VISTARIL) 50 MG tablet Take 1 tablet (50 mg total) by mouth every 6 (six) hours as needed for itching. 08/16/15   Blanchie Dessert, MD  nicotine (NICODERM CQ - DOSED IN MG/24 HOURS) 14 mg/24hr patch Place 1 patch (14 mg total) onto the skin daily. 08/25/15   [name], MD    Family History Family History  Problem Relation Age of Onset  . Cancer Maternal Grandmother     colon  . Asthma Sister     Social History Social History  Substance Use Topics  . Smoking status: Current Some Day Smoker    Packs/day: 0.25    Years: 8.00    Types: Cigarettes    Start date: 02/14/2003  . Smokeless tobacco: Never Used     Comment: TRYING TO QUIT   . Alcohol use No     Allergies   Review of patient's allergies indicates no known allergies.   Review of Systems Review of Systems  Skin: Positive for color change and rash.  All other systems reviewed and are negative.  Physical Exam Updated Vital Signs BP 136/90   Pulse 80   Temp 98.4 F (36.9 C)   Resp 18   Ht _0  (1.727 m)   Wt 209 lb (94.8 kg)   SpO2 100%  BMI 31.78 kg/m   Physical Exam  Constitutional: He is oriented to person, place, and time. No distress.  HENT:  Head: Atraumatic.  Right Ear: External ear normal.  Left Ear: External ear normal.  Nose: Nose normal.  Eyes: Conjunctivae are normal. No scleral icterus.  Cardiovascular: Normal rate and regular rhythm.   Pulmonary/Chest: Effort normal. No respiratory distress.  Abdominal: He exhibits no distension.  Neurological: He is alert and oriented to person, place, and time.  Skin: Skin is warm and dry. He is not diaphoretic.  Left forearm with three scabbed over papular lesions. The entire surrounding  area is mildly edematous. No discrete area of fluctuance or purulent head visible. Surrounding skin with erythema and warm to touch. Area is marked with a skin marker. Entire area of erythema ~4cm in diameter  Psychiatric: He has a normal mood and affect. His behavior is normal.  Nursing note and vitals reviewed.    ED Treatments / Results  DIAGNOSTIC STUDIES: Oxygen Saturation is 100% on RA, normal by my interpretation.    COORDINATION OF CARE: 5:55 PM- Pt advised of plan for treatment and pt agrees.  Labs (all labs ordered are listed, but only abnormal results are displayed) Labs Reviewed - No data to display  EKG  EKG Interpretation None       Radiology No results found.  Procedures Procedures (including critical care time)  Medications Ordered in ED Medications  ibuprofen (ADVIL,MOTRIN) tablet 600 mg (600 mg Oral Given 11/20/15 1801)  sulfamethoxazole-trimethoprim (BACTRIM DS,SEPTRA DS) 800-160 MG per tablet 1 tablet (1 tablet Oral Given 11/20/15 1801)  cephALEXin (KEFLEX) capsule 500 mg (500 mg Oral Given 11/20/15 1802)     Initial Impression / Assessment and Plan / ED Course  I have reviewed the triage vital signs and the nursing notes.  Pertinent labs & imaging results that were available during my care of the patient were reviewed by me and considered in my medical decision making (see chart for details).  Clinical Course   Area appears consistent with insect bite(s) with concomittant cellulitis. There is no discrete abscess or area of fluctuance. We will plan to treat with course of bactrim and keflex. Encouraged warm compresses. Ibuprofen or tylenol as needed for pain. Instructed return in 48 hrs for re-check, or sooner for new or worsening symptoms. Pt otherwise afebrile and nontoxic appearing, stable for discharge.  Final Clinical Impressions(s) / ED Diagnoses   Final diagnoses:  Bug bite with infection, initial encounter    New Prescriptions Discharge  Medication List as of 11/20/2015  6:18 PM    START taking these medications   Details  cephALEXin (KEFLEX) 500 MG capsule Take 1 capsule (500 mg total) by mouth 3 (three) times daily., Starting Sat 11/20/2015, Print    sulfamethoxazole-trimethoprim (BACTRIM DS,SEPTRA DS) 800-160 MG tablet Take 1 tablet by mouth 2 (two) times daily., Starting Sat 11/20/2015, Until Sat 11/27/2015, Print       I personally performed the services described in this documentation, which was scribed in my presence. The recorded information has been reviewed and is accurate.    Anne Ng, PA-C 11/20/15 1841    Blanchie Dessert, MD 11/20/15 [phone]

## 2015-11-23 ENCOUNTER — Ambulatory Visit (INDEPENDENT_AMBULATORY_CARE_PROVIDER_SITE_OTHER): Payer: Medicaid Other | Admitting: Pulmonary Disease

## 2015-11-23 ENCOUNTER — Encounter: Payer: Self-pay | Admitting: Pulmonary Disease

## 2015-11-23 VITALS — BP 110/74 | HR 79 | Ht 68.0 in | Wt 210.8 lb

## 2015-11-23 DIAGNOSIS — Z720 Tobacco use: Secondary | ICD-10-CM | POA: Diagnosis not present

## 2015-11-23 DIAGNOSIS — J449 Chronic obstructive pulmonary disease, unspecified: Secondary | ICD-10-CM

## 2015-11-23 DIAGNOSIS — Z23 Encounter for immunization: Secondary | ICD-10-CM

## 2015-11-23 NOTE — Progress Notes (Signed)
Current Outpatient Prescriptions on File Prior to Visit  Medication Sig  . albuterol (PROVENTIL HFA;VENTOLIN HFA) 108 (90 Base) MCG/ACT inhaler Inhale 1-2 puffs into the lungs every 4 (four) hours as needed for wheezing or shortness of breath (cough).  Marland Kitchen. albuterol (PROVENTIL) (2.5 MG/3ML) 0.083% nebulizer solution USE ONE VIAL IN NEBULIZER EVERY 4 HOURS AS NEEDED FOR WHEEZING  . budesonide-formoterol (SYMBICORT) 160-4.5 MCG/ACT inhaler Inhale 2 puffs into the lungs 2 (two) times daily.   No current facility-administered medications on file prior to visit.      Chief Complaint  Patient presents with  . Follow-up    Pt. states with that his breathing is "ok" with some sob with exertion, having his inhaler more, Has some coughing Pt. denies chest pain      Pulmonary tests PFT 03/31/15 >> FEV1 3.09 (72%), FEV1% 68, TLC 6.13 (94%), DLCO 96%, no BD  Past medical history Asthma  Past surgical history, Family history, Social history, Allergies reviewed  Vital Signs BP 110/74 (BP Location: Left Arm, Patient Position: Sitting, Cuff Size: Normal)   Pulse 79   Ht 5\' 8"  (1.727 m)   Wt 210 lb 12.8 oz (95.6 kg)   SpO2 97%   BMI 32.05 kg/m   History of Present Illness Nicholas Brooks is a 30 y.o. male smoker with hx of asthma.  He is still smoking 1/2 ppd.  He wants to join army, but was told his asthma would prohibit that because he is on daily inhaler therapy.  He uses albuterol several times per week.  He feels symbicort helps.  He gets cough with clear sputum.  He denies fever, sinus congestion, chest pain, sore throat, or skin rash.  Physical Exam  General - No distress ENT - No sinus tenderness, no oral exudate, no LAN Cardiac - s1s2 regular, no murmur Chest - No wheeze/rales/dullness Back - No focal tenderness Abd - Soft, non-tender Ext - No edema Neuro - Normal strength Skin - No rashes Psych - normal mood, and behavior   Assessment/Plan  COPD with asthma. - continue  symbicort and prn albuterol  Tobacco abuse. - discussed options to help with smoking cessation - he will try nicotine patch   Patient Instructions  Flu shot today  Follow up in 6 months    Coralyn HellingVineet Mikiah Durall, MD [facility] Pager:  [phone] 11/23/2015, 2:07 PM

## 2015-11-23 NOTE — Patient Instructions (Signed)
Flu shot today Follow up in 6 months 

## 2016-01-22 ENCOUNTER — Emergency Department (HOSPITAL_BASED_OUTPATIENT_CLINIC_OR_DEPARTMENT_OTHER)
Admission: EM | Admit: 2016-01-22 | Discharge: 2016-01-22 | Disposition: A | Discharge status: Home / Self Care | Payer: Medicaid Other | Attending: Emergency Medicine | Admitting: Emergency Medicine

## 2016-01-22 ENCOUNTER — Encounter (HOSPITAL_BASED_OUTPATIENT_CLINIC_OR_DEPARTMENT_OTHER): Payer: Self-pay | Admitting: Emergency Medicine

## 2016-01-22 DIAGNOSIS — R0602 Shortness of breath: Secondary | ICD-10-CM | POA: Diagnosis present

## 2016-01-22 DIAGNOSIS — L02213 Cutaneous abscess of chest wall: Secondary | ICD-10-CM | POA: Diagnosis not present

## 2016-01-22 DIAGNOSIS — Z79899 Other long term (current) drug therapy: Secondary | ICD-10-CM | POA: Insufficient documentation

## 2016-01-22 DIAGNOSIS — J45901 Unspecified asthma with (acute) exacerbation: Secondary | ICD-10-CM | POA: Diagnosis not present

## 2016-01-22 DIAGNOSIS — F17210 Nicotine dependence, cigarettes, uncomplicated: Secondary | ICD-10-CM | POA: Insufficient documentation

## 2016-01-22 MED ORDER — IPRATROPIUM-ALBUTEROL 0.5-2.5 (3) MG/3ML IN SOLN
RESPIRATORY_TRACT | Status: AC
  Administered 2016-01-22: 3 mL
  Filled 2016-01-22: qty 3

## 2016-01-22 MED ORDER — SULFAMETHOXAZOLE-TRIMETHOPRIM 800-160 MG PO TABS: 1.0000 | ORAL_TABLET | Freq: Two times a day (BID) | ORAL | 0 refills | Status: AC

## 2016-01-22 MED ORDER — ALBUTEROL SULFATE (2.5 MG/3ML) 0.083% IN NEBU
INHALATION_SOLUTION | RESPIRATORY_TRACT | Status: AC
  Administered 2016-01-22: 2.5 mg
  Filled 2016-01-22: qty 3

## 2016-01-22 NOTE — ED Provider Notes (Addendum)
[facility] DEPT [facility] Provider Note   CSN: [id number] Arrival date & time: 01/22/16  1451  By signing my name below, I, Macon Large, attest that this documentation has been prepared under the direction and in the presence of Sherwood Gambler, MD. Electronically Signed: Macon Large, ED Scribe. 01/22/16. 3:21 PM.  History   Chief Complaint Chief Complaint  Patient presents with  . Abscess  . Shortness of Breath   The history is provided by the patient. No language interpreter was used.   HPI Comments: Nicholas Brooks is a 30 y.o. male who presents to the Emergency Department complaining of moderate, gradually worsening, redness and swelling on right upper chest onset a week ago. Pt reports associated redness with mild swelling around what he believes to be an abscess or an insect bite. Pt states he did not witness or feel for a specific bite. He notes the abscess began as a blackhead and it developed a white head. Pt notes he tried popping it but noticed no drainage and increased pain when attempting to pop it. He states his pain is worsened when he is laying down and with turning. Pt reports using Neosporin on the affected area with no relief. He also reports wheezing with accompanied SOB PTA. He notes having asthma. Pt states he has an inhaler to relieve his symptoms but states he does not have it with him at this time. He notes asthma treatment given in ED has relieved pain. No additional complaints at this time. He denies fever, coughing.   Past Medical History:  Diagnosis Date  . Asthma     Patient Active Problem List   Diagnosis Date Noted  . 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 Family history of congenital or genetic condition 05/11/2015  . Smokes 1/2 pack/day or less 02/19/2015  . Asthma exacerbation 02/07/2015  . Acute respiratory failure with hypoxia ([place]) 02/07/2015  . Hypokalemia 02/07/2015  . Leukocytosis 02/07/2015  . SIRS (systemic  inflammatory response syndrome) ([place]) 02/07/2015    Past Surgical History:  Procedure Laterality Date  . MANDIBLE SURGERY    . WISDOM TOOTH EXTRACTION         Home Medications    Prior to Admission medications   Medication Sig Start Date End Date Taking? Authorizing Provider  albuterol (PROVENTIL HFA;VENTOLIN HFA) 108 (90 Base) MCG/ACT inhaler Inhale 1-2 puffs into the lungs every 4 (four) hours as needed for wheezing or shortness of breath (cough). 08/10/15   [name], MD  albuterol (PROVENTIL) (2.5 MG/3ML) 0.083% nebulizer solution USE ONE VIAL IN NEBULIZER EVERY 4 HOURS AS NEEDED FOR WHEEZING 02/10/15   [name], MD  budesonide-formoterol [facility]) 160-4.5 MCG/ACT inhaler Inhale 2 puffs into the lungs 2 (two) times daily. 02/10/15   [name], MD  sulfamethoxazole-trimethoprim (BACTRIM DS,SEPTRA DS) 800-160 MG tablet Take 1 tablet by mouth 2 (two) times daily. 01/22/16 01/29/16  Sherwood Gambler, MD    Family History Family History  Problem Relation Age of Onset  . Cancer Maternal Grandmother     colon  . Asthma Sister     Social History Social History  Substance Use Topics  . Smoking status: Current Some Day Smoker    Packs/day: 0.25    Years: 8.00    Types: Cigarettes    Start date: 02/14/2003  . Smokeless tobacco: Never Used     Comment: TRYING TO QUIT   . Alcohol use No     Allergies   Patient has no known allergies.  Review of Systems Review of Systems  Constitutional: Negative for fever.  Respiratory: Positive for shortness of breath and wheezing. Negative for cough.   Skin:       + Abscess  + redness   All other systems reviewed and are negative.    Physical Exam Updated Vital Signs BP 152/94 (BP Location: Left Arm)   Pulse 94   Temp 98.3 F (36.8 C) (Oral)   Resp 18   Ht 5' 8" (1.727 m)   Wt 210 lb (95.3 kg)   SpO2 98%   BMI 31.93 kg/m   Physical Exam  Constitutional: He is oriented to person, place, and time. He appears  well-developed and well-nourished.  HENT:  Head: Normocephalic and atraumatic.  Right Ear: External ear normal.  Left Ear: External ear normal.  Nose: Nose normal.  Eyes: Right eye exhibits no discharge. Left eye exhibits no discharge.  Neck: Neck supple.  Cardiovascular: Normal rate, regular rhythm and normal heart sounds.   Pulmonary/Chest: Effort normal and breath sounds normal. He has no wheezes. He exhibits tenderness.    Abdominal: He exhibits no distension.  Musculoskeletal: He exhibits no edema.  Neurological: He is alert and oriented to person, place, and time.  Skin: Skin is warm and dry. There is erythema.  Nursing note and vitals reviewed.    ED Treatments / Results   DIAGNOSTIC STUDIES: Oxygen Saturation is 98% on RA, normal by my interpretation.    COORDINATION OF CARE: 3:11 PM Discussed treatment plan with pt at bedside which includes chest US and pt agreed to plan.   Labs (all labs ordered are listed, but only abnormal results are displayed) Labs Reviewed - No data to display  EKG  EKG Interpretation None       Radiology No results found.  Procedures Procedures (including critical care time) EMERGENCY DEPARTMENT US SOFT TISSUE INTERPRETATION "Study: Limited Soft Tissue Ultrasound"  INDICATIONS: Pain and Soft tissue infection Multiple views of the body part were obtained in real-time with a multi-frequency linear probe PERFORMED BY:  Myself IMAGES ARCHIVED?: Yes SIDE:Right  BODY PART:Chest Wall FINDINGS: Abcess present INTERPRETATION:  Abcess present - small superficial abscess present    Medications Ordered in ED Medications  albuterol (PROVENTIL) (2.5 MG/3ML) 0.083% nebulizer solution (2.5 mg  Given 01/22/16 1503)  ipratropium-albuterol (DUONEB) 0.5-2.5 (3) MG/3ML nebulizer solution (3 mLs  Given 01/22/16 1503)     Initial Impression / Assessment and Plan / ED Course  I have reviewed the triage vital signs and the nursing  notes.  Pertinent labs & imaging results that were available during my care of the patient were reviewed by me and considered in my medical decision making (see chart for details).  Clinical Course as of Jan 21 1522  Sat Jan 22, 2016  1520 On bedside ultrasound there is a very small amount of superficial fluid collection seen at the point of the scab. However the rest of it appears to be cellulitis. I discussed options including needle drainage versus I&D versus warm soaks and antibiotics. He does not want drainage at this time. Think it is so small that it probably is amenable to warm soaks and antibiotics. As for his asthma exacerbation it is completely resolved with one albuterol neb here. Seems to be quite mild. No indication for steroids. He has his albuterol inhaler now and does not need any further care. Strict return precautions  [SG]    Clinical Course User Index [SG]  , MD    No   systemic signs/symptoms. Discussed strict return precautions and using soaks. F/u with PCP, urgent care or here if not improving  Final Clinical Impressions(s) / ED Diagnoses   Final diagnoses:  Abscess of chest wall  Mild asthma exacerbation    New Prescriptions New Prescriptions   SULFAMETHOXAZOLE-TRIMETHOPRIM (BACTRIM DS,SEPTRA DS) 800-160 MG TABLET    Take 1 tablet by mouth 2 (two) times daily.   I personally performed the services described in this documentation, which was scribed in my presence. The recorded information has been reviewed and is accurate.      , MD 01/22/16 1524     , MD 01/22/16 1538  

## 2016-01-22 NOTE — ED Notes (Signed)
States," I was at a family gathering and I got SOB" Did not have inhaler . ? Abscess to right upper chest x 1 week, reddened area noted with scab.

## 2016-01-22 NOTE — ED Triage Notes (Signed)
Patient states that he has an abscess to his right chest x 1 week would like looked at. He also reports that he is wheezing and would like an "asthma treatment"

## 2016-02-07 IMAGING — US US ABDOMEN COMPLETE
1 series · 14 of 25 positions shown · non-contrast
Comparison: [HOSPITAL] Chest radiographs [DATE]

CLINICAL DATA: 31-year-old male with right upper quadrant abdominal
pain for 3-4 months.

EXAM:
ABDOMEN ULTRASOUND COMPLETE

[Series 1: us abdomen complete · 0.19mm/px · 14 of 111 slices shown]
[im 1/111]
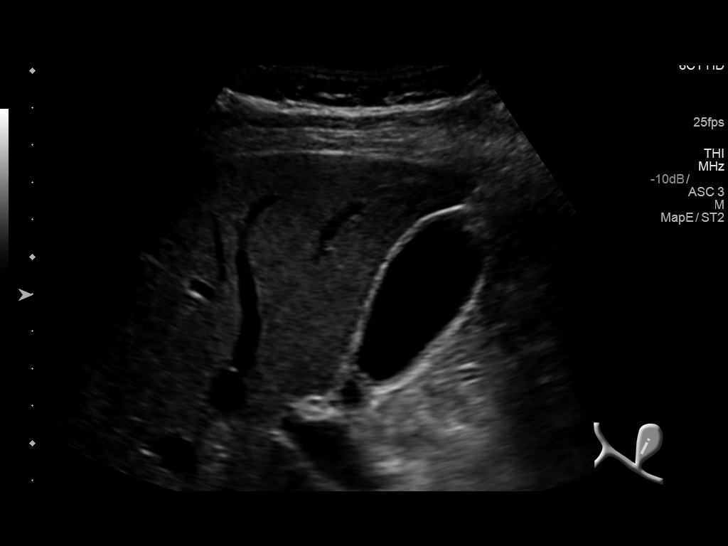
[im 10/111]
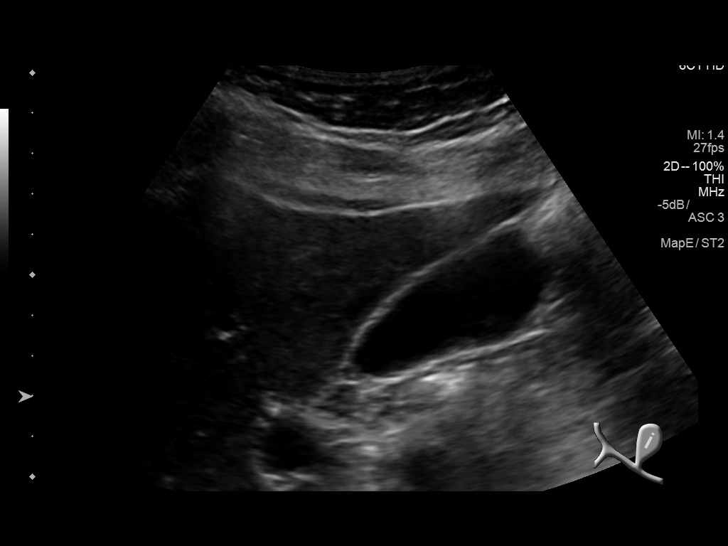
[im 19/111]
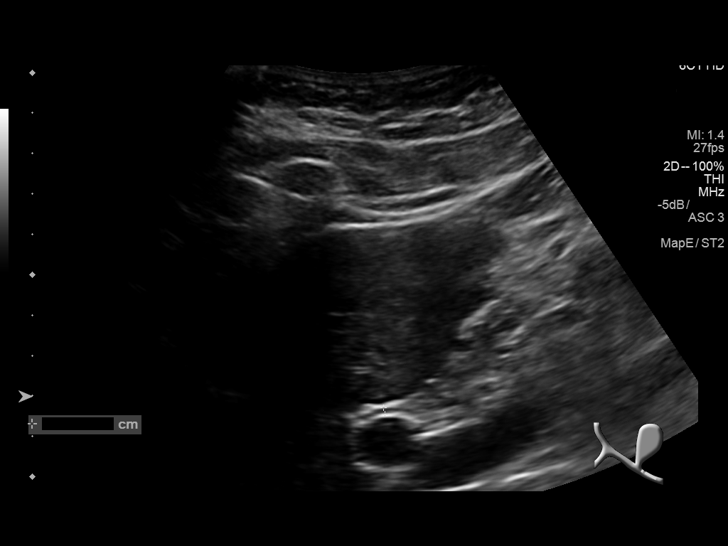
[im 28/111]
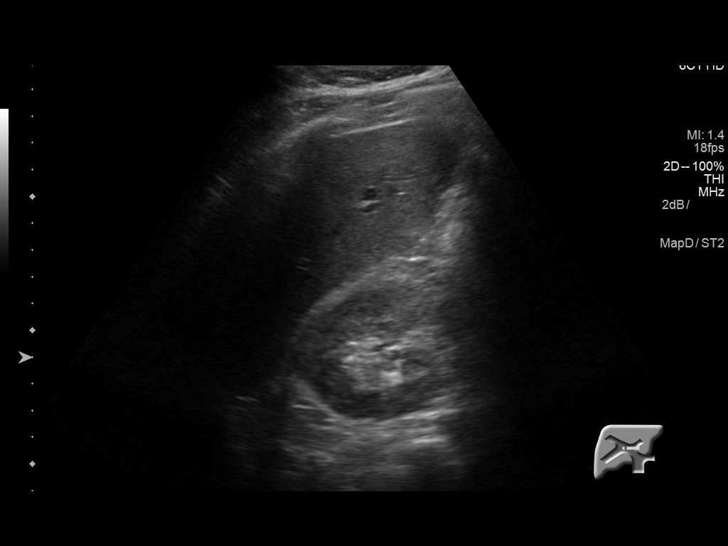
[im 37/111]
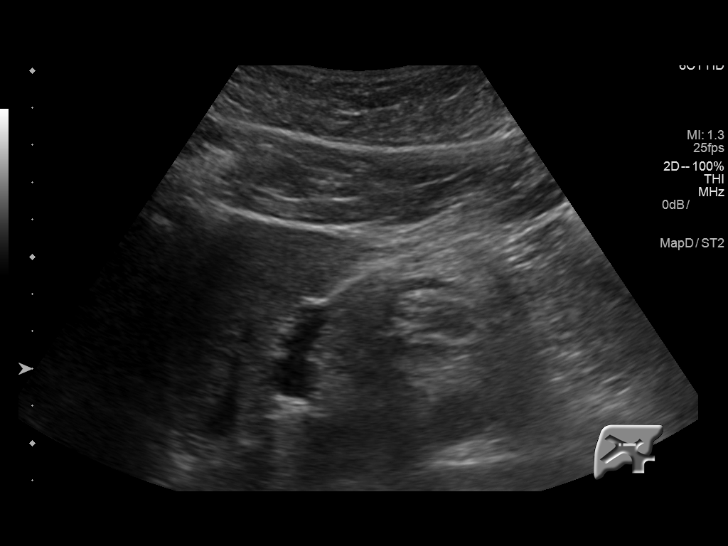
[im 42/111]
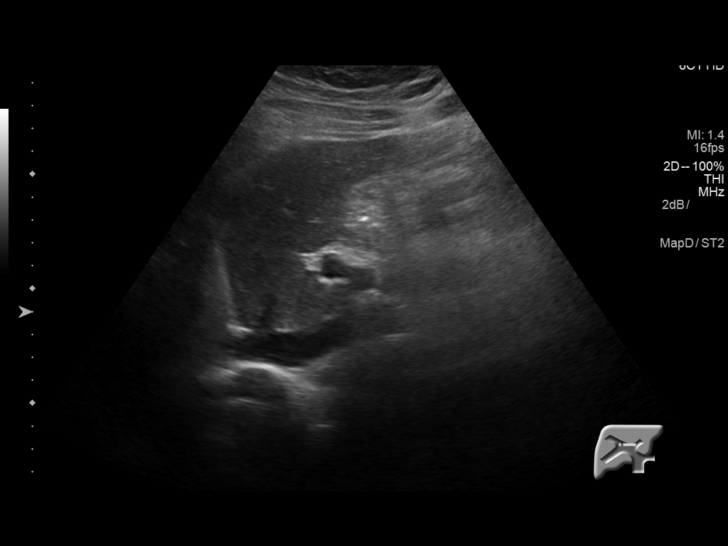
[im 51/111]
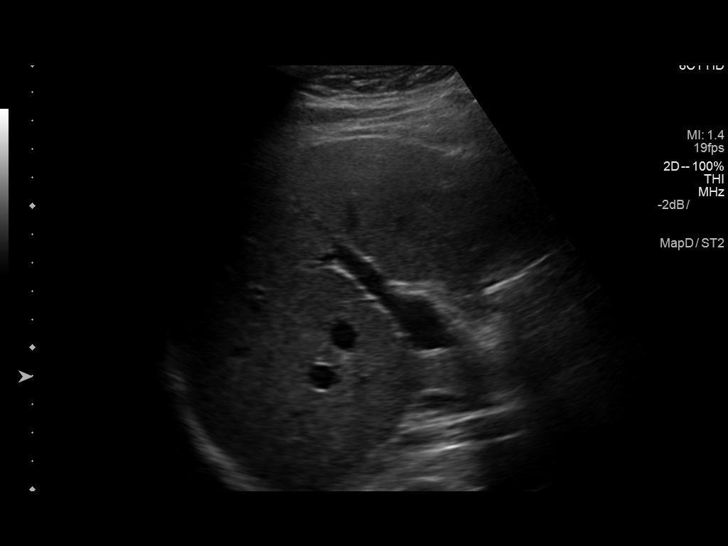
[im 60/111]
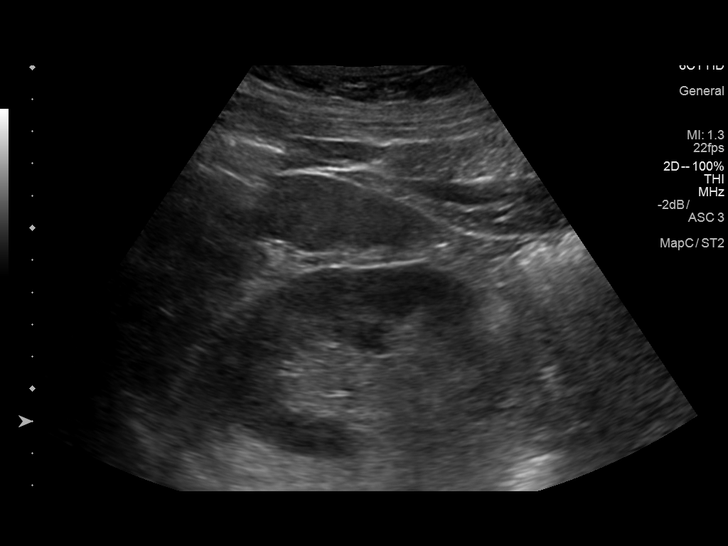
[im 69/111]
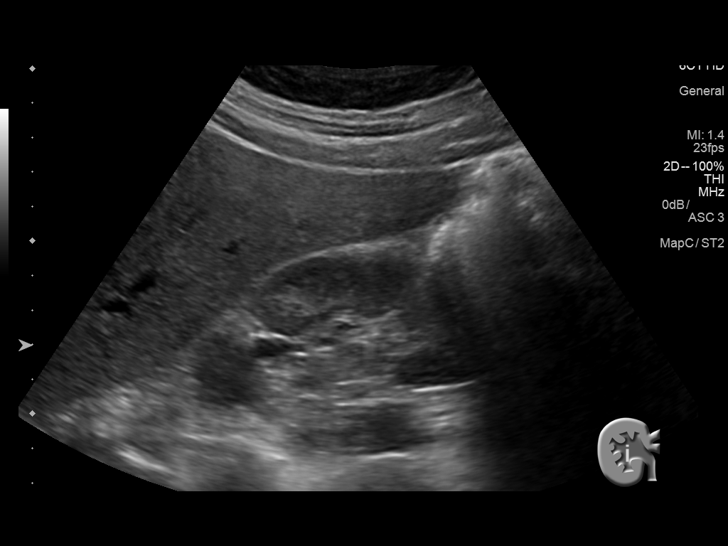
[im 74/111]
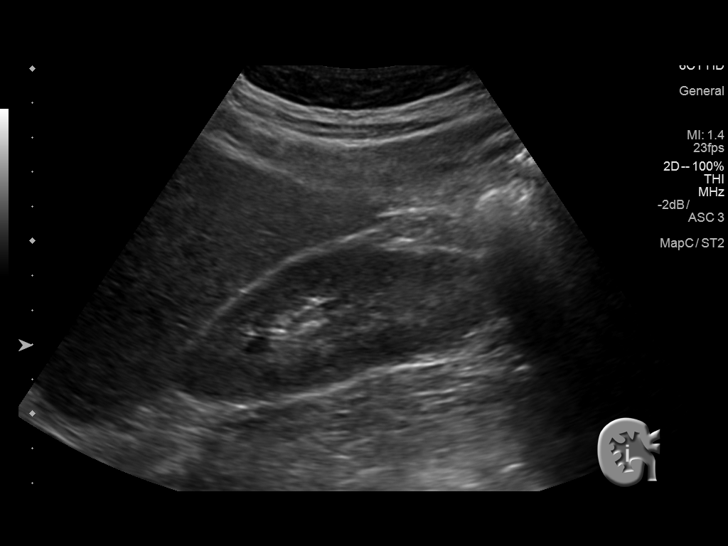
[im 83/111]
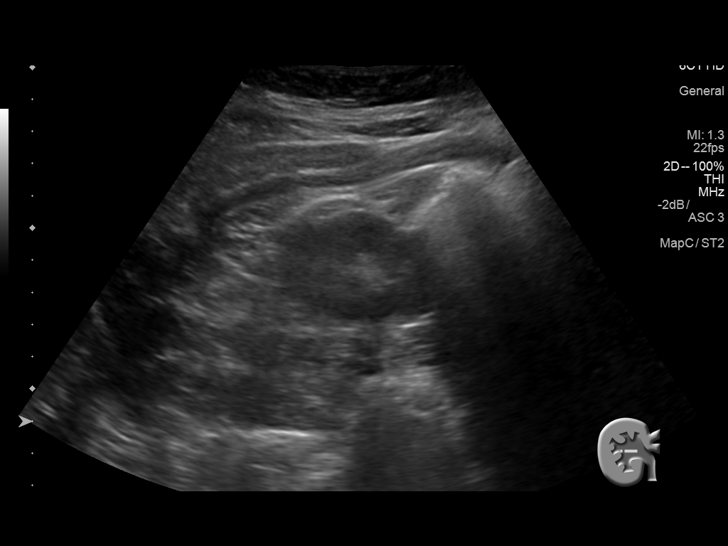
[im 92/111]
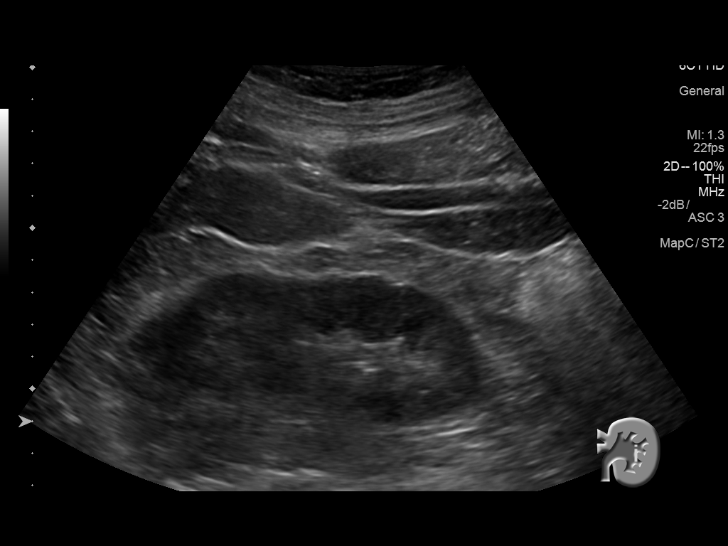
[im 101/111]
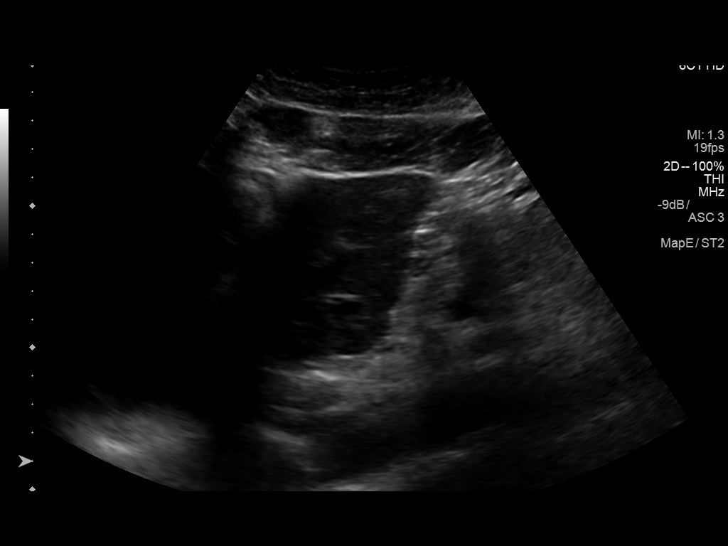
[im 111/111]
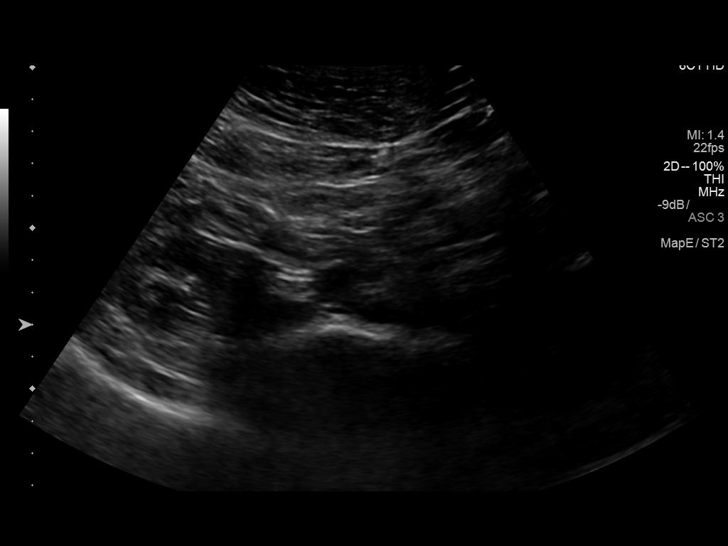

[14 of 25 positions shown; findings below may reference images not displayed]

FINDINGS: Gallbladder: No gallstones or wall thickening visualized. No
sonographic Murphy sign noted by sonographer.

Common bile duct: Diameter: 2 mm, normal

Liver: No focal lesion identified. Within normal limits in
parenchymal echogenicity.

IVC: No abnormality visualized.

Pancreas: Visualized portion unremarkable.

Spleen: Size and appearance within normal limits.

Right Kidney: Length: 11.9 cm. Echogenicity within normal limits. No
mass or hydronephrosis visualized.

Left Kidney: Length: 11.9 cm. Echogenicity within normal limits. No
mass or hydronephrosis visualized.

Abdominal aorta: No aneurysm visualized.

Other findings: None.
IMPRESSION: Normal abdomen ultrasound.

## 2016-02-09 ENCOUNTER — Emergency Department (HOSPITAL_COMMUNITY): Payer: Medicaid Other

## 2016-02-09 ENCOUNTER — Emergency Department (HOSPITAL_COMMUNITY)
Admission: EM | Admit: 2016-02-09 | Discharge: 2016-02-09 | Disposition: A | Discharge status: Home / Self Care | Payer: Medicaid Other | Attending: Emergency Medicine | Admitting: Emergency Medicine

## 2016-02-09 ENCOUNTER — Encounter (HOSPITAL_COMMUNITY): Payer: Self-pay | Admitting: Emergency Medicine

## 2016-02-09 DIAGNOSIS — F17210 Nicotine dependence, cigarettes, uncomplicated: Secondary | ICD-10-CM | POA: Insufficient documentation

## 2016-02-09 DIAGNOSIS — J45909 Unspecified asthma, uncomplicated: Secondary | ICD-10-CM | POA: Insufficient documentation

## 2016-02-09 DIAGNOSIS — J069 Acute upper respiratory infection, unspecified: Secondary | ICD-10-CM | POA: Diagnosis not present

## 2016-02-09 DIAGNOSIS — R05 Cough: Secondary | ICD-10-CM | POA: Diagnosis present

## 2016-02-09 LAB — RAPID STREP SCREEN (MED CTR MEBANE ONLY): STREPTOCOCCUS, GROUP A SCREEN (DIRECT): NEGATIVE

## 2016-02-09 IMAGING — CR DG CHEST 2V
2 series · 2 of 2 positions shown · non-contrast
Comparison: [DATE].

CLINICAL DATA: Worsening cough.

EXAM:
CHEST  2 VIEW

[w chest pa]
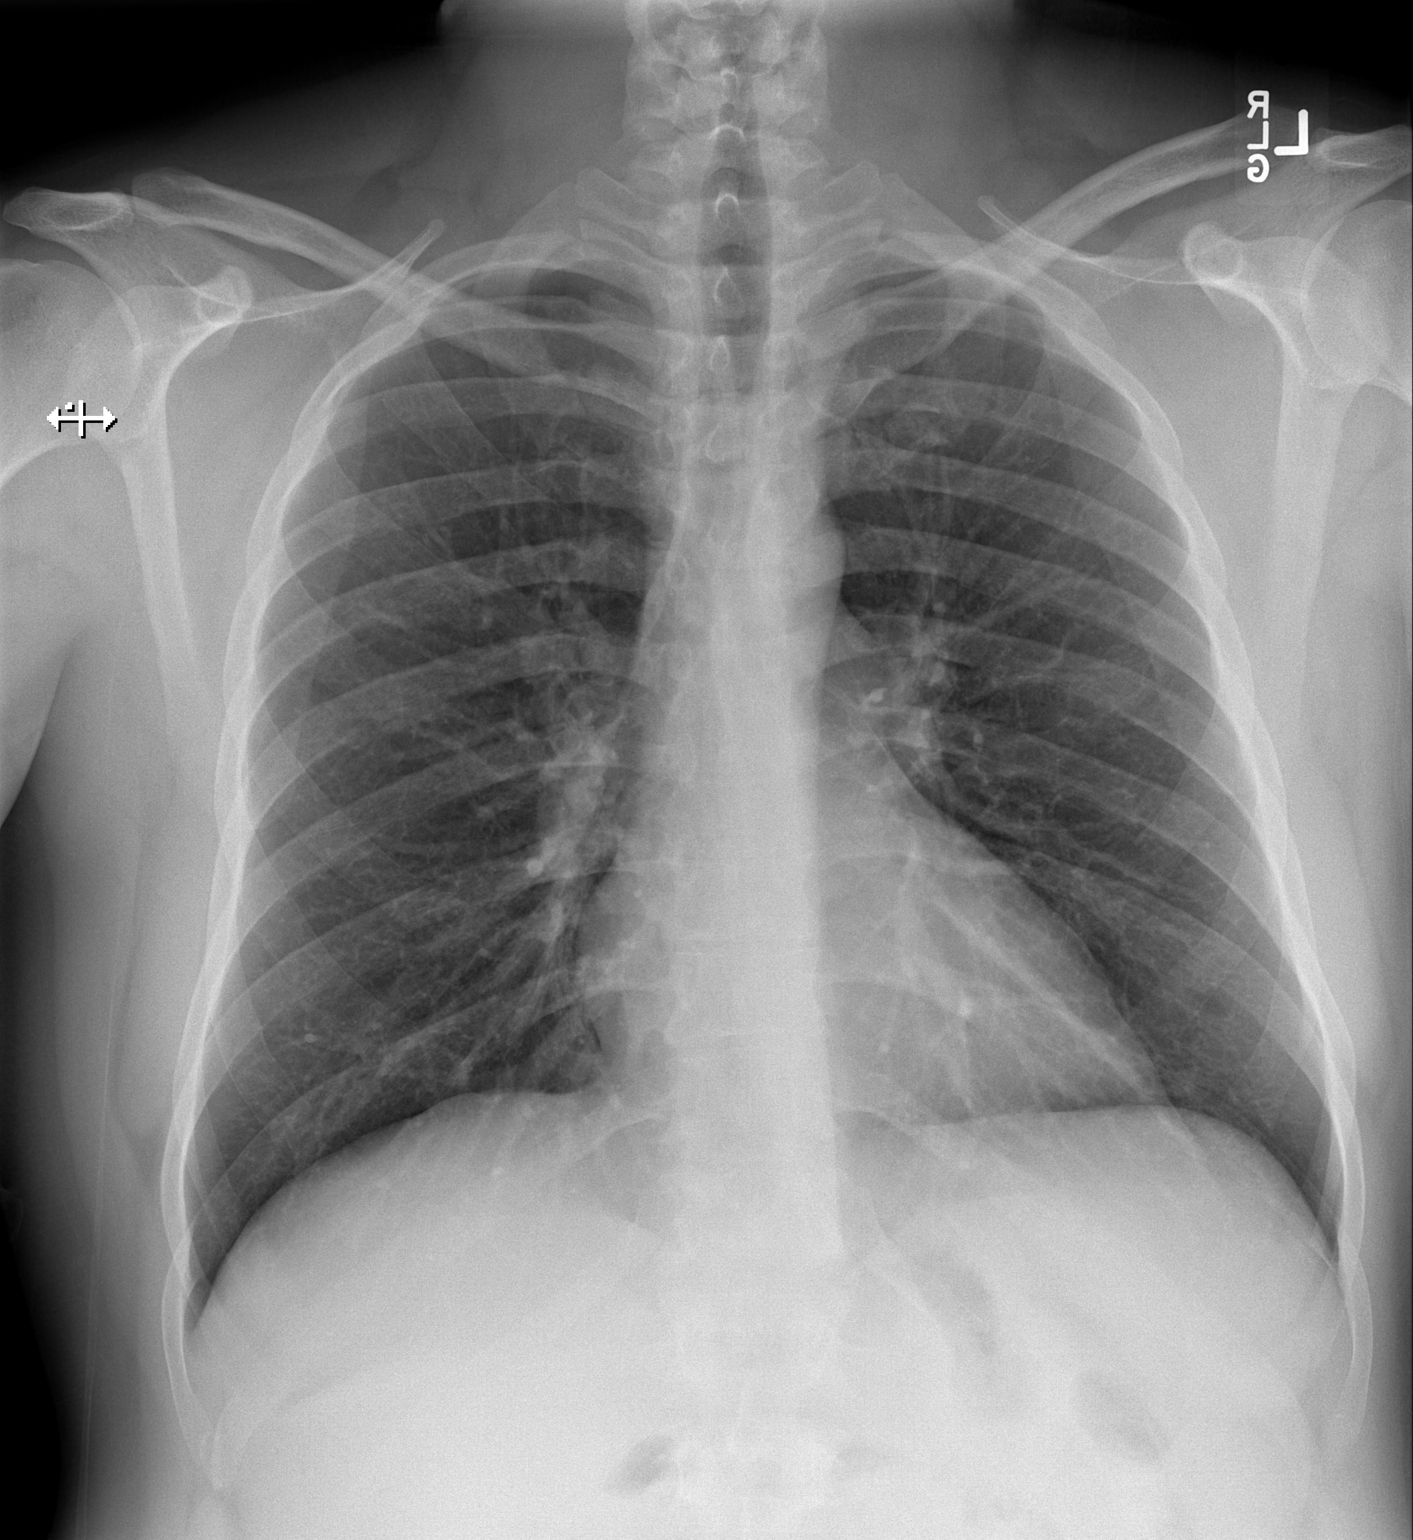

[w chest lat]
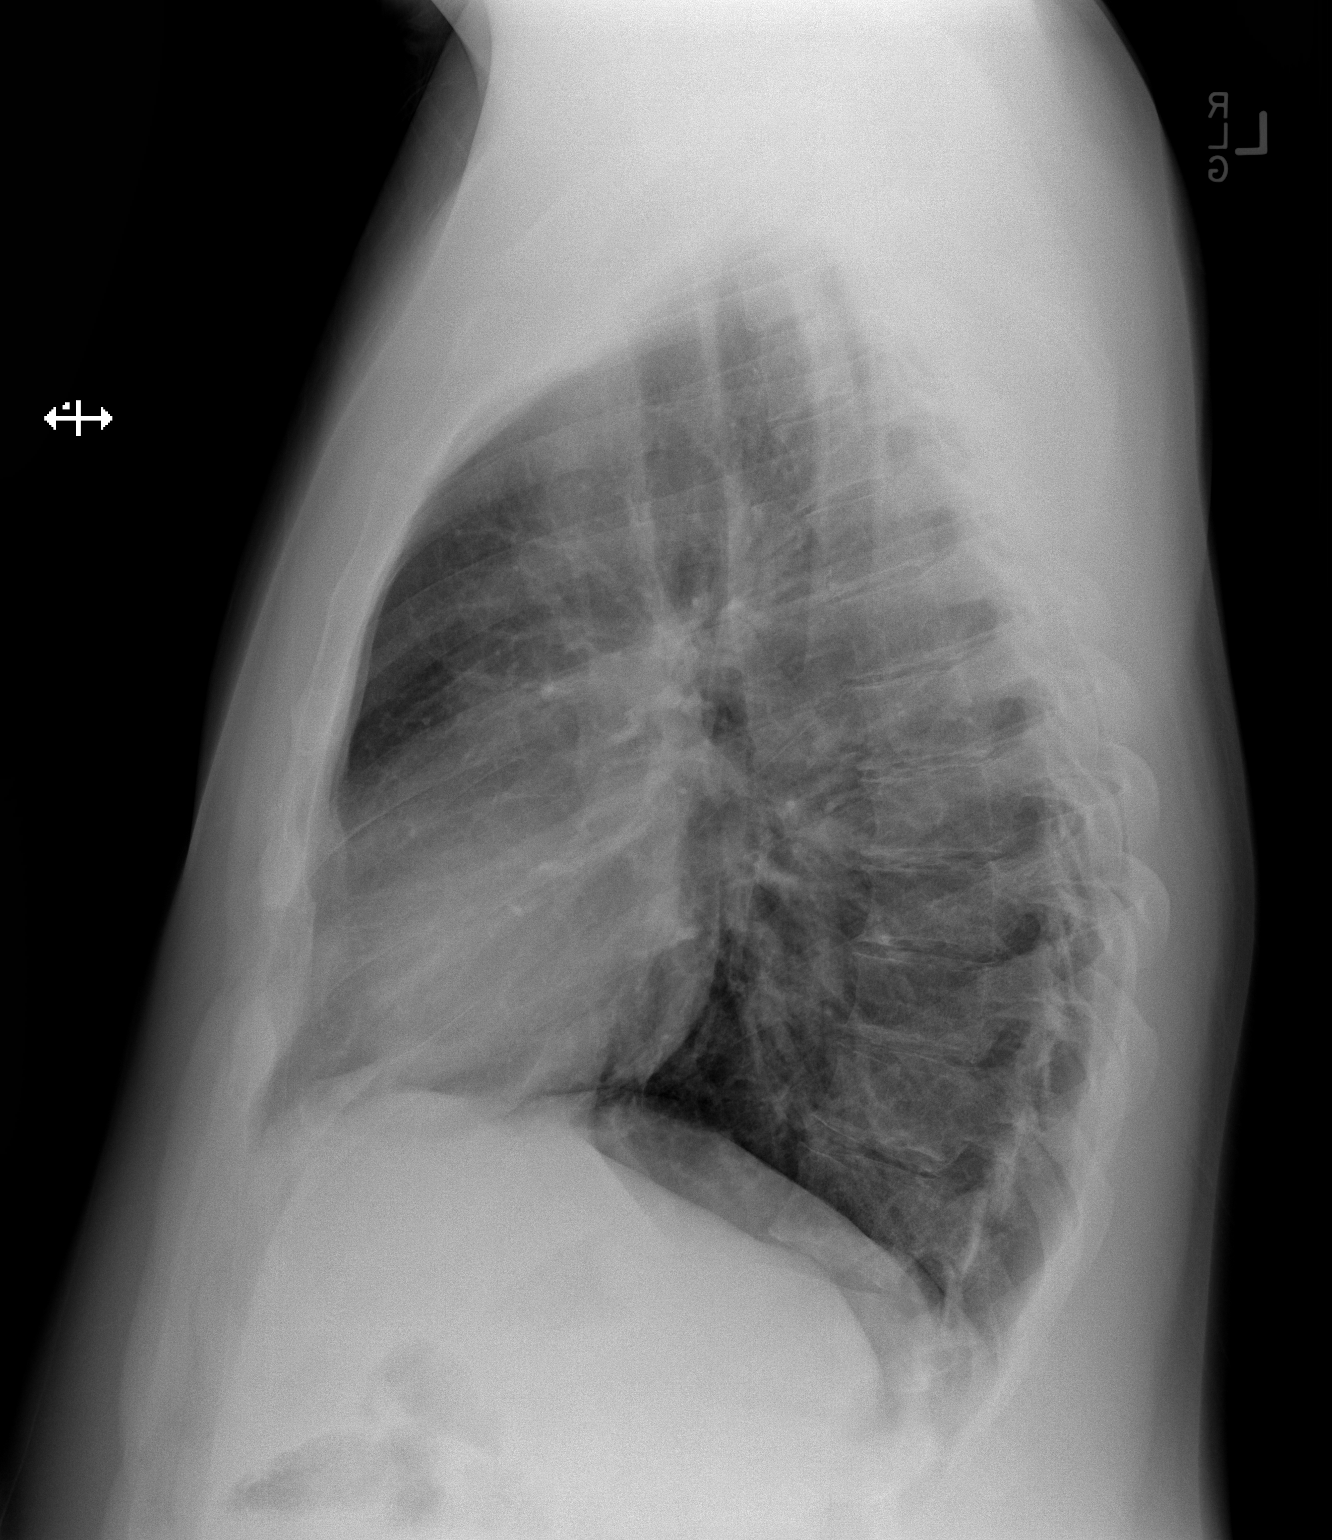

[2 of 2 positions shown; findings below may reference images not displayed]

FINDINGS: Mediastinum hilar structures normal. Lungs are clear. Heart size
normal. No pleural effusion or pneumothorax.
IMPRESSION: No acute cardiopulmonary disease.

## 2016-02-09 MED ORDER — METHYLPREDNISOLONE 4 MG PO TBPK: ORAL_TABLET | ORAL | 0 refills | Status: DC

## 2016-02-09 MED ORDER — BENZONATATE 100 MG PO CAPS: 100.0000 mg | ORAL_CAPSULE | Freq: Three times a day (TID) | ORAL | 0 refills | Status: DC | PRN

## 2016-02-09 NOTE — Discharge Instructions (Signed)
Alternate Tylenol and ibuprofen for pain relief. Use your albuterol inhaler you have at home for cough. Take Tessalon Perles as needed for cough. Take Medrol dose pack as prescribed and taper over 6 days. Drink plenty of fluids.   Get help right away if: You have severe or persistent: Headache. Ear pain. Sinus pain. Chest pain. You have chronic lung disease and any of the following: Wheezing. Prolonged cough. Coughing up blood. A change in your usual mucus. You have a stiff neck. You have changes in your: Vision. Hearing. Thinking. Mood.

## 2016-02-09 NOTE — ED Notes (Signed)
Bed: WA21 Expected date:  Expected time:  Means of arrival:  Comments: 

## 2016-02-09 NOTE — ED Triage Notes (Signed)
Patient is complaining of a non-productive cough x 3 days. Denies fever or chilis.  History of asthma.

## 2016-02-09 NOTE — ED Provider Notes (Signed)
[facility] DEPT Provider Note   CSN: [id number] Arrival date & time: 02/09/16  7209     History   Chief Complaint Chief Complaint  Patient presents with  . Sore Throat  . Cough    HPI Nicholas Brooks is a 30 y.o. male   Patient is a 30 year old male history of asthma, 1 q 21.1 microdelection syndrome complains of nonproductive cough 3 days. Patient states that he's had associated congestion, sore throat, wheezing. Patient reports chest soreness and shortness of breath on deep inspiration secondary to his cough. Patient reports symptoms have not changed. Patient states that he is tried by DayQuil and NyQuil and neither have helped. Patient hasn't tried anything else for her symptoms. Patient reports his son having a recent viral upper respiratory infection. Patient denies true chest pain, true shortness of breath, fevers, chills, nausea, vomiting, urinary symptoms, changes in bowel movements, abdominal pain.   The history is provided by the patient.  Sore Throat  Pertinent negatives include no abdominal pain.  Cough  Associated symptoms include rhinorrhea, myalgias (Slight) and wheezing. Pertinent negatives include no chills.    Past Medical History:  Diagnosis Date  . Asthma     Patient Active Problem List   Diagnosis Date Noted  . 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 Family history of congenital or genetic condition 05/11/2015  . Smokes 1/2 pack/day or less 02/19/2015  . Asthma exacerbation 02/07/2015  . Acute respiratory failure with hypoxia ([place]) 02/07/2015  . Hypokalemia 02/07/2015  . Leukocytosis 02/07/2015  . SIRS (systemic inflammatory response syndrome) ([place]) 02/07/2015    Past Surgical History:  Procedure Laterality Date  . MANDIBLE SURGERY    . WISDOM TOOTH EXTRACTION         Home Medications    Prior to Admission medications   Medication Sig Start Date End Date Taking? Authorizing Provider  albuterol (PROVENTIL HFA;VENTOLIN  HFA) 108 (90 Base) MCG/ACT inhaler Inhale 1-2 puffs into the lungs every 4 (four) hours as needed for wheezing or shortness of breath (cough). 08/10/15  Yes [name], MD  albuterol (PROVENTIL) (2.5 MG/3ML) 0.083% nebulizer solution USE ONE VIAL IN NEBULIZER EVERY 4 HOURS AS NEEDED FOR WHEEZING Patient taking differently: Take 2.5 mg by nebulization every 4 (four) hours as needed for wheezing or shortness of breath.  02/10/15  Yes [name], MD  budesonide-formoterol [facility]) 160-4.5 MCG/ACT inhaler Inhale 2 puffs into the lungs 2 (two) times daily. 02/10/15  Yes [name], MD  benzonatate (TESSALON) 100 MG capsule Take 1 capsule (100 mg total) by mouth 3 (three) times daily as needed for cough. 02/09/16   Dilynn Brooks Manuel [place], [place]  methylPREDNISolone (MEDROL DOSEPAK) 4 MG TBPK tablet Taper over 6 days 02/09/16   Nicholas Brooks [place], [place]    Family History Family History  Problem Relation Age of Onset  . Cancer Maternal Grandmother     colon  . Asthma Sister     Social History Social History  Substance Use Topics  . Smoking status: Current Some Day Smoker    Packs/day: 0.25    Years: 8.00    Types: Cigarettes    Start date: 02/14/2003  . Smokeless tobacco: Never Used     Comment: TRYING TO QUIT   . Alcohol use No     Allergies   Patient has no known allergies.   Review of Systems Review of Systems  Constitutional: Negative for chills and fever.  HENT: Positive for congestion and rhinorrhea.   Respiratory:  Positive for cough, chest tightness and wheezing.   Gastrointestinal: Negative for abdominal pain.  Musculoskeletal: Positive for myalgias (Slight).  Neurological: Negative for numbness.     Physical Exam Updated Vital Signs BP 122/88 (BP Location: Left Arm)   Pulse 84   Temp 98.7 F (37.1 C) (Oral)   Resp 18   Ht '5\' 8"'  (1.727 m)   Wt 95.3 kg   SpO2 95%   BMI 31.93 kg/m   Physical Exam  Constitutional: He is oriented to person, place, and  time. He appears well-developed and well-nourished.  HENT:  Head: Normocephalic and atraumatic.  Nose: Nose normal.  Mouth/Throat: Oropharynx is clear and moist.  Eyes: Conjunctivae and EOM are normal. Pupils are equal, round, and reactive to light.  Neck: Normal range of motion. Neck supple.  Cardiovascular: Normal rate and normal heart sounds.   Pulmonary/Chest: Effort normal. He has wheezes.  Abdominal: Soft.  Neurological: He is alert and oriented to person, place, and time.  Skin: Skin is warm.  Psychiatric: He has a normal mood and affect. His behavior is normal.     ED Treatments / Results  Labs (all labs ordered are listed, but only abnormal results are displayed) Labs Reviewed  RAPID STREP SCREEN (NOT AT [facility])  CULTURE, GROUP A STREP [facility])    EKG  EKG Interpretation None       Radiology Dg Chest 2 View  Result Date: 02/09/2016 CLINICAL DATA:  Worsening cough. EXAM: CHEST  2 VIEW COMPARISON:  02/07/2015. FINDINGS: Mediastinum hilar structures normal. Lungs are clear. Heart size normal. No pleural effusion or pneumothorax. IMPRESSION: No acute cardiopulmonary disease. Electronically Signed   By: Marcello Moores  Register   On: 02/09/2016 10:21    Procedures Procedures (including critical care time)  Medications Ordered in ED Medications - No data to display   Initial Impression / Assessment and Plan / ED Course  I have reviewed the triage vital signs and the nursing notes.  Pertinent labs & imaging results that were available during my care of the patient were reviewed by me and considered in my medical decision making (see chart for details).  Clinical Course   Patient is a 30 year old male complaining of sore throat and cough 3 days. On exam patient is afebrile, vital signs stable, in no apparent distress. Heart sounds clear. Normal work of breathing. No hypoxia. Wheezing heard on auscultation. Abdomen soft and nontender. Throat benign. No evidence of  oropharyngeal exudates, erythema. Chest x-ray shows no cardiopulmonary process at this time. Rapid strep screen is negative. Patients symptoms are consistent with URI, likely viral etiology. Discussed that antibiotics are not indicated for viral infections. Pt will be discharged with symptomatic treatment.  Verbalizes understanding and is agreeable with plan. Pt is hemodynamically stable & in NAD prior to dc.  Final Clinical Impressions(s) / ED Diagnoses   Final diagnoses:  Upper respiratory tract infection, unspecified type    New Prescriptions New Prescriptions   BENZONATATE (TESSALON) 100 MG CAPSULE    Take 1 capsule (100 mg total) by mouth 3 (three) times daily as needed for cough.   METHYLPREDNISOLONE (MEDROL DOSEPAK) 4 MG TBPK TABLET    Taper over 6 days     [place], [place] 02/09/16 1041    Davonna Belling, MD 02/09/16 [phone]

## 2016-02-11 LAB — CULTURE, GROUP A STREP (THRC)

## 2016-02-23 ENCOUNTER — Other Ambulatory Visit: Payer: Self-pay | Admitting: Adult Health

## 2016-02-23 ENCOUNTER — Other Ambulatory Visit: Payer: Self-pay | Admitting: Pulmonary Disease

## 2016-02-23 ENCOUNTER — Telehealth: Payer: Self-pay | Admitting: Pulmonary Disease

## 2016-02-23 NOTE — Telephone Encounter (Signed)
Spoke with patient regarding refills. Rx was sent for patient. Patient is aware.

## 2016-05-15 ENCOUNTER — Emergency Department (HOSPITAL_BASED_OUTPATIENT_CLINIC_OR_DEPARTMENT_OTHER)
Admission: EM | Admit: 2016-05-15 | Discharge: 2016-05-16 | Disposition: A | Discharge status: Home / Self Care | Payer: Medicaid Other | Attending: Emergency Medicine | Admitting: Emergency Medicine

## 2016-05-15 ENCOUNTER — Encounter (HOSPITAL_BASED_OUTPATIENT_CLINIC_OR_DEPARTMENT_OTHER): Payer: Self-pay

## 2016-05-15 DIAGNOSIS — J45909 Unspecified asthma, uncomplicated: Secondary | ICD-10-CM | POA: Insufficient documentation

## 2016-05-15 DIAGNOSIS — F17210 Nicotine dependence, cigarettes, uncomplicated: Secondary | ICD-10-CM | POA: Diagnosis not present

## 2016-05-15 DIAGNOSIS — L02415 Cutaneous abscess of right lower limb: Secondary | ICD-10-CM | POA: Diagnosis not present

## 2016-05-15 DIAGNOSIS — M7989 Other specified soft tissue disorders: Secondary | ICD-10-CM | POA: Diagnosis present

## 2016-05-15 MED ORDER — SULFAMETHOXAZOLE-TRIMETHOPRIM 800-160 MG PO TABS: 1.0000 | ORAL_TABLET | Freq: Two times a day (BID) | ORAL | 0 refills | Status: AC

## 2016-05-15 MED ORDER — LIDOCAINE HCL 2 % IJ SOLN
10.0000 mL | Freq: Once | INTRAMUSCULAR | Status: AC
  Administered 2016-05-16: 200 mg
  Filled 2016-05-15: qty 20

## 2016-05-15 NOTE — ED Notes (Addendum)
Alert, NAD, calm, interactive, resps e/u, speaking in clear complete sentences, no dyspnea noted, skin W&D, VSS, c/o R lateral shin lesion with redness, heat, TTP and previous scant drainage when squeezed, (denies: pain, numbness, tingling, sob, nv, fever, dizziness, cramping, HA or other sx). Onset 3 weeks ago. No meds PTA. Denies h/o DM, denies lancing/piercing lesion.

## 2016-05-15 NOTE — ED Triage Notes (Signed)
c/o swelling, redness to right LE x 3 weeks-denies injury-round red area noted to right lateral lower tib/fib area-NAD-steady gait

## 2016-05-15 NOTE — Discharge Instructions (Signed)
Please read instructions below. Talk with your primary care provider about any new medications. Keep your wound clean and dry. Finish all of your antibiotics (Bactrim) as prescribed. Return to ER for fever or worsening symptoms.

## 2016-05-15 NOTE — ED Provider Notes (Signed)
[facility] DEPT [facility] Provider Note   CSN: [id number] Arrival date & time: 05/15/16  1844   By signing my name below, I, Nicholas Brooks, attest that this documentation has been prepared under the direction and in the presence of Nicholas Russo, PA-C. Electronically Signed: Charolotte Brooks, Scribe. 05/15/16. 10:09 PM.    History   Chief Complaint Chief Complaint  Patient presents with  . Leg Swelling    HPI Nicholas Brooks is a 31 y.o. male with h/o psoriasis who presents to the Emergency Department complaining of gradual onset leg swelling that began 3 weeks ago w assoc pain. Pt says that he "popped something" on his ankle 2.5 weeks ago and then it swelled up. Pt states that he has had infected hair follicles in the past. Pt is a smoker .5 ppd. Pt denies h/o DM. Pt denies fever, chills, . NKDA.   The history is provided by the patient. No language interpreter was used.    Past Medical History:  Diagnosis Date  . Asthma     Patient Active Problem List   Diagnosis Date Noted  . 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 Family history of congenital or genetic condition 05/11/2015  . Smokes 1/2 pack/day or less 02/19/2015  . Asthma exacerbation 02/07/2015  . Acute respiratory failure with hypoxia ([place]) 02/07/2015  . Hypokalemia 02/07/2015  . Leukocytosis 02/07/2015  . SIRS (systemic inflammatory response syndrome) ([place]) 02/07/2015    Past Surgical History:  Procedure Laterality Date  . MANDIBLE SURGERY    . WISDOM TOOTH EXTRACTION         Home Medications    Prior to Admission medications   Medication Sig Start Date End Date Taking? Authorizing Provider  albuterol (PROVENTIL) (2.5 MG/3ML) 0.083% nebulizer solution USE ONE VIAL IN NEBULIZER EVERY 4 HOURS AS NEEDED FOR WHEEZING Patient taking differently: Take 2.5 mg by nebulization every 4 (four) hours as needed for wheezing or shortness of breath.  02/10/15   [name], MD  budesonide-formoterol [facility])  160-4.5 MCG/ACT inhaler Inhale 2 puffs into the lungs 2 (two) times daily. 02/10/15   [name], MD  PROVENTIL HFA 108 [phone] Base) MCG/ACT inhaler INHALE ONE TO TWO PUFFS INTO LUNGS EVERY 4 HOURS AS NEEDED FOR WHEEZING, SHORTNESS OF BREATH OR COUGH 02/24/16   [name], MD  sulfamethoxazole-trimethoprim (BACTRIM DS,SEPTRA DS) 800-160 MG tablet Take 1 tablet by mouth 2 (two) times daily. 05/15/16 05/22/16  Nicholas N Russo, PA-C  SYMBICORT 160-4.5 MCG/ACT inhaler INHALE TWO PUFFS BY MOUTH TWICE DAILY 02/23/16   Melvenia Needles, NP    Family History Family History  Problem Relation Age of Onset  . Cancer Maternal Grandmother     colon  . Asthma Sister     Social History Social History  Substance Use Topics  . Smoking status: Current Every Day Smoker    Packs/day: 0.25    Years: 8.00    Types: Cigarettes    Start date: 02/14/2003  . Smokeless tobacco: Never Used  . Alcohol use No     Allergies   Patient has no known allergies.   Review of Systems Review of Systems  Constitutional: Negative for chills and fever.  Musculoskeletal: Negative for arthralgias.  Skin: Positive for wound (R lateral lower leg).  Allergic/Immunologic: Negative for immunocompromised state.  Neurological: Negative for numbness.  All other systems reviewed and are negative.    Physical Exam Updated Vital Signs BP (!) 135/91 (BP Location: Right Arm)   Pulse 85  Temp 98.4 F (36.9 C) (Oral)   Resp 18   Ht '5\' 8"'  (1.727 m)   Wt 87.1 kg   SpO2 99%   BMI 29.19 kg/m   Physical Exam  Constitutional: He is oriented to person, place, and time. He appears well-developed and well-nourished.  HENT:  Head: Normocephalic and atraumatic.  Eyes: Conjunctivae are normal.  Neck: Normal range of motion.  Cardiovascular: Normal rate and intact distal pulses.   Pulmonary/Chest: Effort normal.  Musculoskeletal: Normal range of motion.  Neurological: He is alert and oriented to person, place, and time.  Skin:  Skin is warm and dry.  R lower lateral leg w 2cm indurated, erythematous lesion., dry pealing skin over wound site. No significant surrounding erythema, no streaking, not draining.  Psychiatric: He has a normal mood and affect.  Nursing note and vitals reviewed.    ED Treatments / Results   DIAGNOSTIC STUDIES: Oxygen Saturation is 99% on room air, normal by my interpretation.    COORDINATION OF CARE: 10:02 PM Discussed treatment plan with pt at bedside and pt agreed to plan.    Labs (all labs ordered are listed, but only abnormal results are displayed) Labs Reviewed - No data to display  EKG  EKG Interpretation None       Radiology No results found.  Procedures .Marland KitchenIncision and Drainage Date/Time: 05/15/2016 11:53 PM Performed by: Brooks, Nicholas N Authorized by: Brooks, Nicholas N   Consent:    Consent obtained:  Verbal   Consent given by:  Patient   Risks discussed:  Bleeding, pain and incomplete drainage   Alternatives discussed:  No treatment Location:    Type:  Abscess   Size:  2 cm   Location:  Lower extremity   Lower extremity location:  Leg   Leg location:  R lower leg Pre-procedure details:    Skin preparation:  Betadine Anesthesia (see MAR for exact dosages):    Anesthesia method:  Local infiltration   Local anesthetic:  Lidocaine 2% w/o epi Procedure type:    Complexity:  Simple Procedure details:    Needle aspiration: no     Incision types:  Stab incision   Incision depth:  Dermal   Scalpel blade:  11   Wound management:  Probed and deloculated   Drainage:  Bloody   Drainage amount:  Scant   Wound treatment:  Wound left open   Packing materials:  None Post-procedure details:    Patient tolerance of procedure:  Tolerated well, no immediate complications    Medications Ordered in ED Medications  lidocaine (XYLOCAINE) 2 % (with pres) injection 200 mg (not administered)     Initial Impression / Assessment and Plan / ED Course  I have  reviewed the triage vital signs and the nursing notes.  Pertinent labs & imaging results that were available during my care of the patient were reviewed by me and considered in my medical decision making (see chart for details).     Patient with skin abscess. Incision and drainage performed in the ED today.  Abscess was not large enough to warrant packing or drain placement. Wound recheck in 3 days. Supportive care and return precautions discussed.  Pt sent home with Bactrim, to tx empirically for MRSA. The patient appears reasonably screened and/or stabilized for discharge and I doubt any other emergent medical condition requiring further screening, evaluation, or treatment in the ED prior to discharge.  Patient discussed with and seen by Dr. Laneta Simmers.  Discussed results, findings, treatment and  follow up. Patient advised of return precautions. Patient verbalized understanding and agreed with plan.   Final Clinical Impressions(s) / ED Diagnoses   MDM  Final diagnoses:  Abscess of right lower leg    New Prescriptions New Prescriptions   SULFAMETHOXAZOLE-TRIMETHOPRIM (BACTRIM DS,SEPTRA DS) 800-160 MG TABLET    Take 1 tablet by mouth 2 (two) times daily.  I personally performed the services described in this documentation, which was scribed in my presence. The recorded information has been reviewed and is accurate.    Nicholas N Russo, PA-C 05/16/16 0003    Leo Grosser, MD 05/16/16 [phone]

## 2016-05-24 ENCOUNTER — Encounter (HOSPITAL_BASED_OUTPATIENT_CLINIC_OR_DEPARTMENT_OTHER): Payer: Self-pay | Admitting: Emergency Medicine

## 2016-05-24 ENCOUNTER — Emergency Department (HOSPITAL_BASED_OUTPATIENT_CLINIC_OR_DEPARTMENT_OTHER)
Admission: EM | Admit: 2016-05-24 | Discharge: 2016-05-24 | Disposition: A | Discharge status: Home / Self Care | Payer: Medicaid Other | Attending: Emergency Medicine | Admitting: Emergency Medicine

## 2016-05-24 ENCOUNTER — Emergency Department (HOSPITAL_BASED_OUTPATIENT_CLINIC_OR_DEPARTMENT_OTHER): Payer: Medicaid Other

## 2016-05-24 DIAGNOSIS — R42 Dizziness and giddiness: Secondary | ICD-10-CM | POA: Diagnosis not present

## 2016-05-24 DIAGNOSIS — J45909 Unspecified asthma, uncomplicated: Secondary | ICD-10-CM | POA: Diagnosis not present

## 2016-05-24 DIAGNOSIS — F17210 Nicotine dependence, cigarettes, uncomplicated: Secondary | ICD-10-CM | POA: Insufficient documentation

## 2016-05-24 LAB — COMPREHENSIVE METABOLIC PANEL
ALBUMIN: 3.8 g/dL (ref 3.5–5.0)
ALK PHOS: 68 U/L (ref 38–126)
ALT: 27 U/L (ref 17–63)
ANION GAP: 6 (ref 5–15)
AST: 19 U/L (ref 15–41)
BUN: 19 mg/dL (ref 6–20)
CALCIUM: 8.9 mg/dL (ref 8.9–10.3)
CO2: 28 mmol/L (ref 22–32)
Chloride: 105 mmol/L (ref 101–111)
Creatinine, Ser: 0.92 mg/dL (ref 0.61–1.24)
GFR calc Af Amer: >60 mL/min (ref >60)
GFR calc non Af Amer: >60 mL/min (ref >60)
GLUCOSE: 98 mg/dL (ref 65–99)
POTASSIUM: 4.1 mmol/L (ref 3.5–5.1)
SODIUM: 139 mmol/L (ref 135–145)
Total Bilirubin: 0.2 mg/dL — ABNORMAL LOW (ref 0.3–1.2)
Total Protein: 6.8 g/dL (ref 6.5–8.1)

## 2016-05-24 LAB — CBC WITH DIFFERENTIAL/PLATELET
BASOS ABS: 0 10*3/uL (ref 0.0–0.1)
BASOS PCT: 0 %
Eosinophils Absolute: 0.2 10*3/uL (ref 0.0–0.7)
Eosinophils Relative: 2 %
HEMATOCRIT: 43.6 % (ref 39.0–52.0)
HEMOGLOBIN: 14.8 g/dL (ref 13.0–17.0)
Lymphocytes Relative: 22 %
Lymphs Abs: 2.2 10*3/uL (ref 0.7–4.0)
MCH: 30.7 pg (ref 26.0–34.0)
MCHC: 33.9 g/dL (ref 30.0–36.0)
MCV: 90.5 fL (ref 78.0–100.0)
MONOS PCT: 11 %
Monocytes Absolute: 1 10*3/uL (ref 0.1–1.0)
NEUTROS ABS: 6.5 10*3/uL (ref 1.7–7.7)
NEUTROS PCT: 65 %
Platelets: 279 10*3/uL (ref 150–400)
RBC: 4.82 MIL/uL (ref 4.22–5.81)
RDW: 15 % (ref 11.5–15.5)
WBC: 9.9 10*3/uL (ref 4.0–10.5)

## 2016-05-24 LAB — CBG MONITORING, ED: GLUCOSE-CAPILLARY: 104 mg/dL — AB (ref 65–99)

## 2016-05-24 IMAGING — CT CT HEAD W/O CM
3 series · 16 of 47 positions shown, 19 images · non-contrast
Comparison: None available

CLINICAL DATA: Acute dizziness, vertigo

EXAM:
CT HEAD WITHOUT CONTRAST
TECHNIQUE: Contiguous axial images were obtained from the base of the skull
through the vertex without intravenous contrast.

[Series 2: head wo · axial · 0.44mm/px · z∈[-153,-18]mm · 10 of 33 slices shown, 13 images]
[im 3/33  brain]
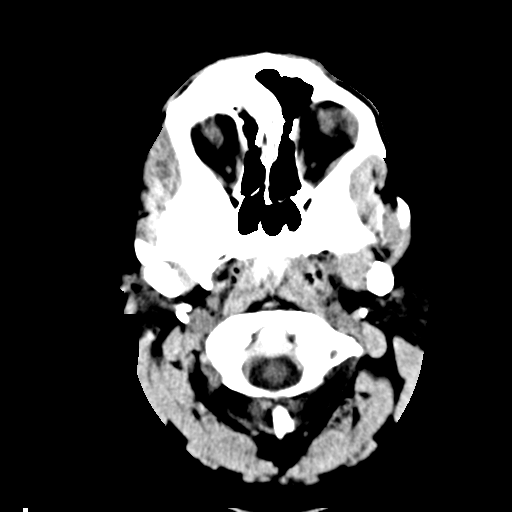
[im 3/33  bone]
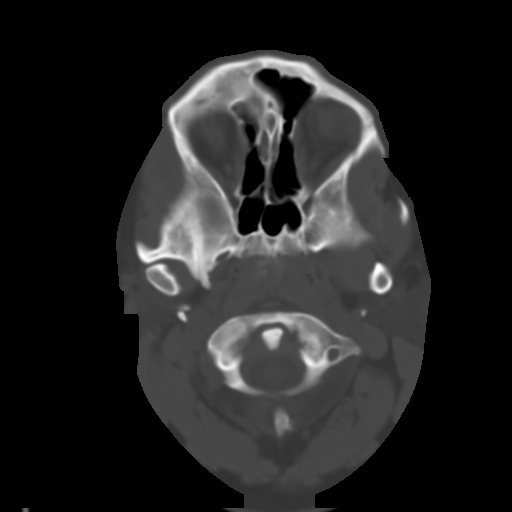
[im 6/33  brain]
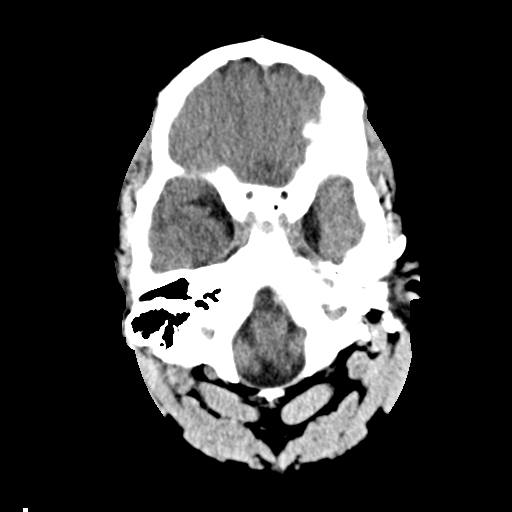
[im 9/33  brain]
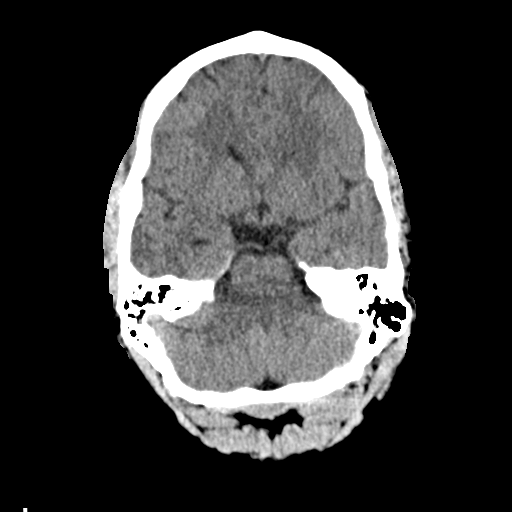
[im 12/33  brain]
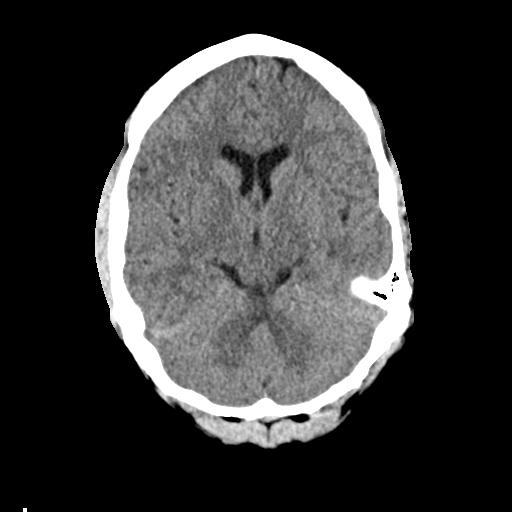
[im 15/33  brain]
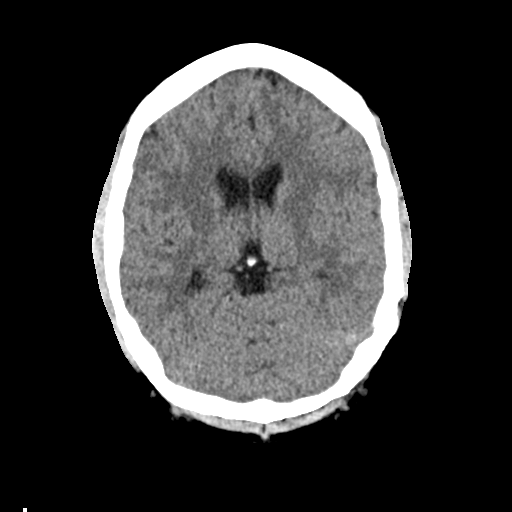
[im 15/33  bone]
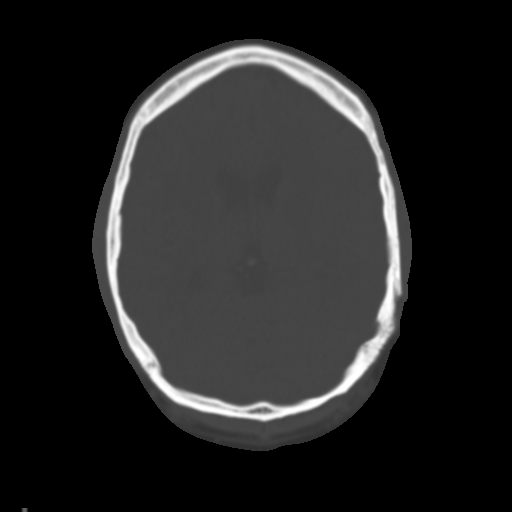
[im 18/33  brain]
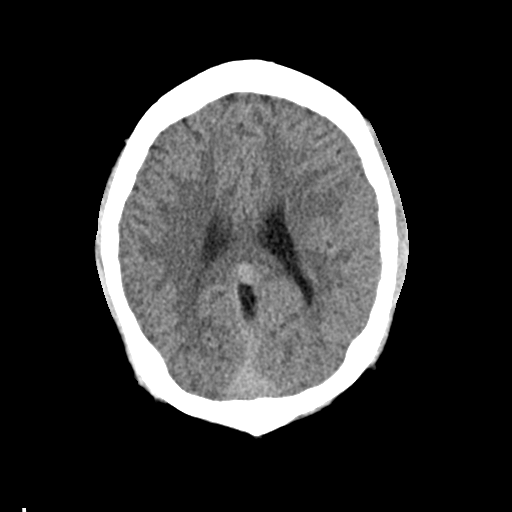
[im 21/33  brain]
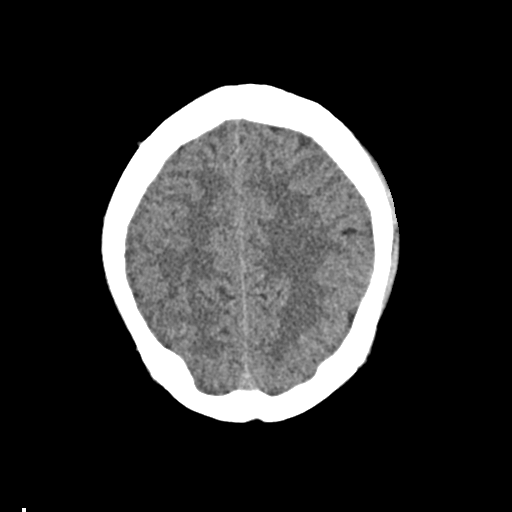
[im 25/33  brain]
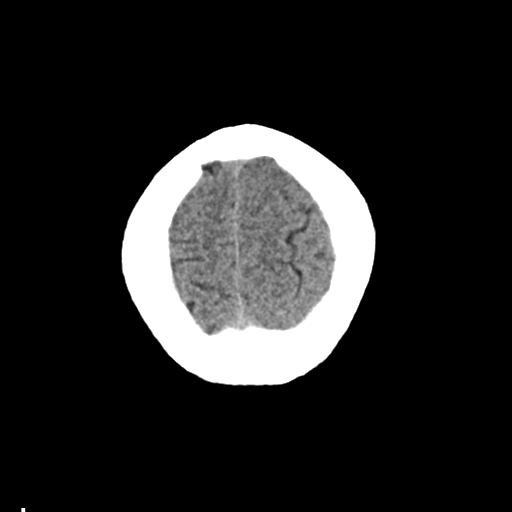
[im 27/33  brain]
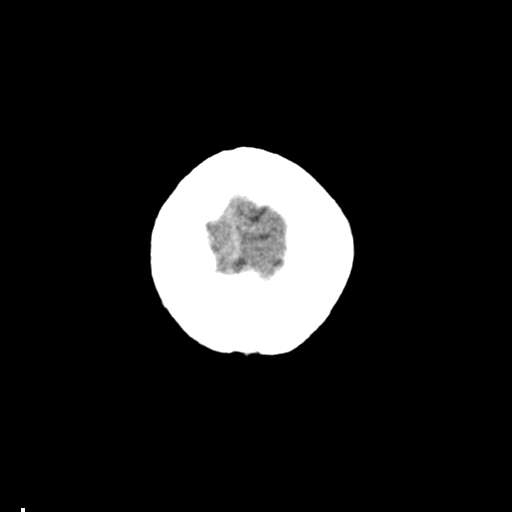
[im 27/33  bone]
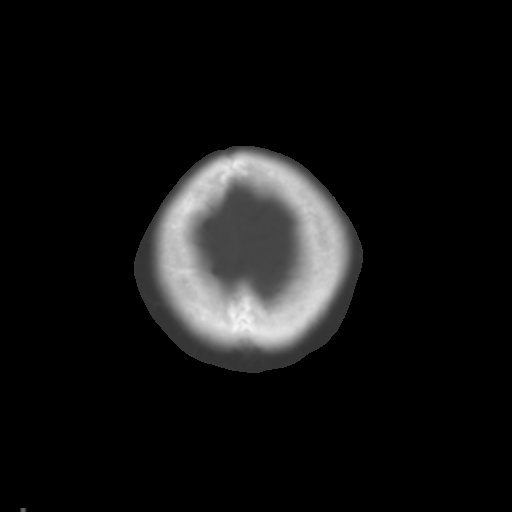
[im 30/33  brain]
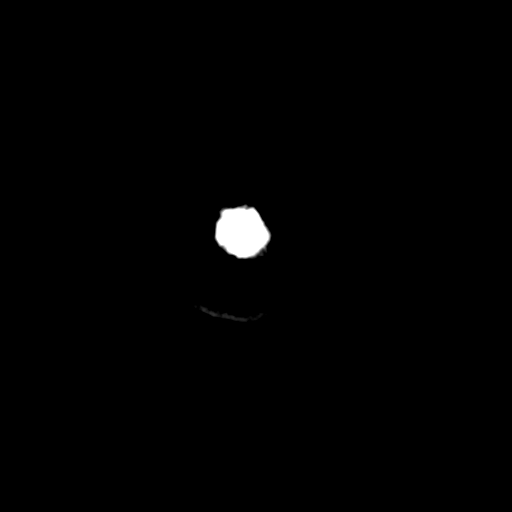

[Series 4: coronal soft · coronal · 0.35mm/px · 3 of 73 slices shown]
[im 25/73  brain]
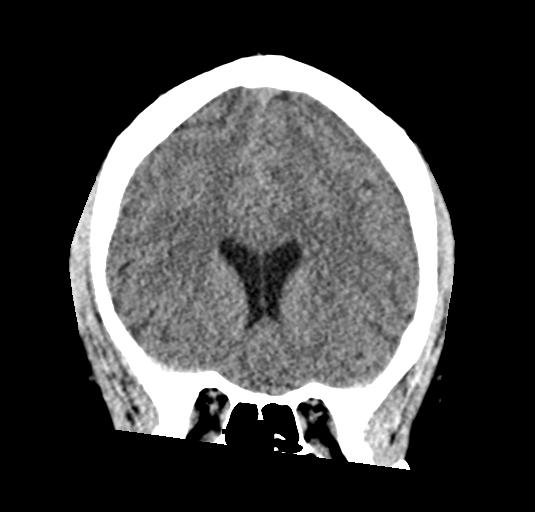
[im 33/73  brain]
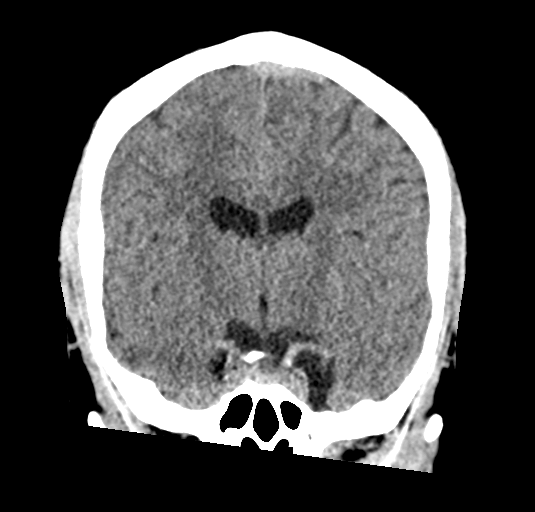
[im 41/73  brain]
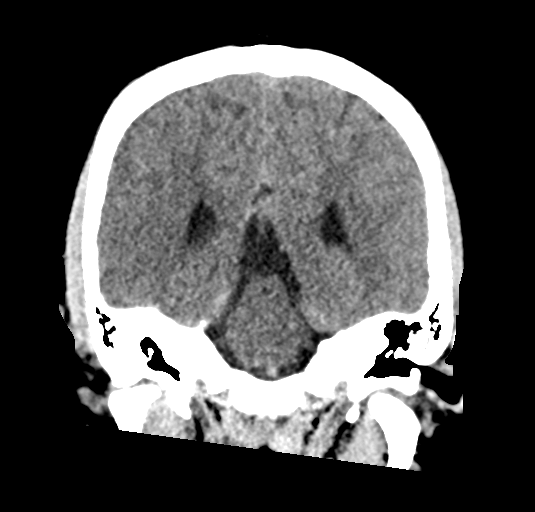

[Series 5: sag soft · sagittal · 0.34mm/px · 3 of 55 slices shown]
[im 19/55  brain]
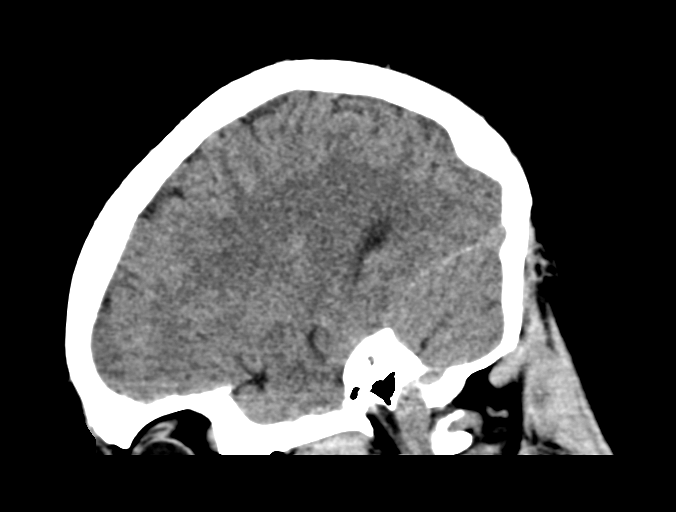
[im 28/55  brain]
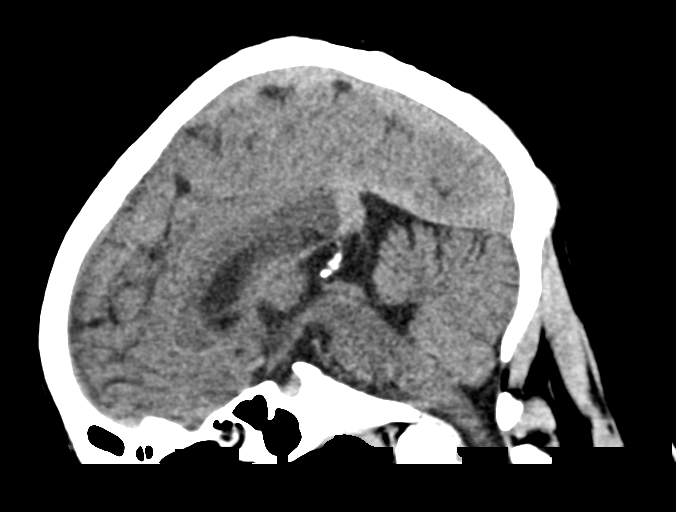
[im 37/55  brain]
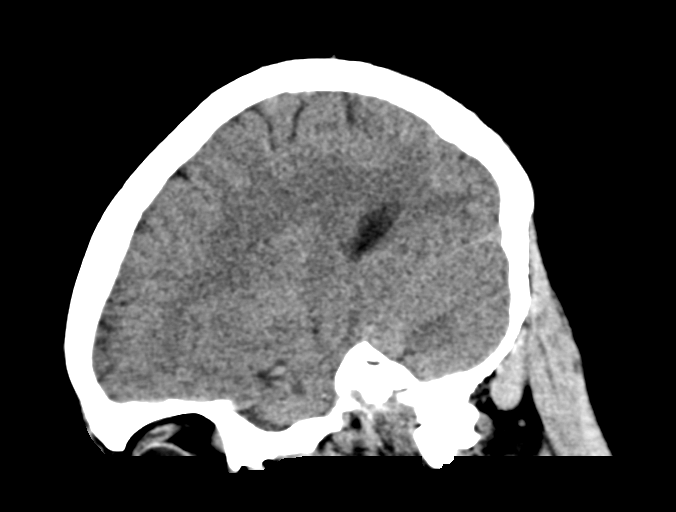

[16 of 47 positions shown; findings below may reference images not displayed]

FINDINGS: Brain: No evidence of acute infarction, hemorrhage, hydrocephalus,
extra-axial collection or mass lesion/mass effect.

Vascular: No hyperdense vessel or unexpected calcification.

Skull: Normal. Negative for fracture or focal lesion.

Sinuses/Orbits: No acute finding.

Other: None.
IMPRESSION: Normal head CT without contrast.

## 2016-05-24 MED ORDER — SODIUM CHLORIDE 0.9 % IV BOLUS (SEPSIS)
1000.0000 mL | Freq: Once | INTRAVENOUS | Status: AC
  Administered 2016-05-24: 1000 mL via INTRAVENOUS

## 2016-05-24 MED ORDER — ONDANSETRON HCL 4 MG/2ML IJ SOLN
4.0000 mg | Freq: Once | INTRAMUSCULAR | Status: AC
  Administered 2016-05-24: 4 mg via INTRAVENOUS
  Filled 2016-05-24: qty 2

## 2016-05-24 MED ORDER — SODIUM CHLORIDE 0.9 % IV SOLN: INTRAVENOUS | Status: DC

## 2016-05-24 MED ORDER — MECLIZINE HCL 25 MG PO TABS: 25.0000 mg | ORAL_TABLET | Freq: Three times a day (TID) | ORAL | 0 refills | Status: DC | PRN

## 2016-05-24 MED ORDER — MECLIZINE HCL 25 MG PO TABS
25.0000 mg | ORAL_TABLET | Freq: Once | ORAL | Status: AC
Start: 2016-05-24 — End: 2016-05-24
  Administered 2016-05-24: 25 mg via ORAL
  Filled 2016-05-24: qty 1

## 2016-05-24 NOTE — ED Triage Notes (Signed)
Dizziness since 8 this morning. Denies N/V. Denies pain.

## 2016-05-24 NOTE — ED Provider Notes (Signed)
[facility] DEPT [facility] Provider Note   CSN: [id number] Arrival date & time: 05/24/16  9735     History   Chief Complaint Chief Complaint  Patient presents with  . Dizziness    HPI Nicholas Brooks is a 31 y.o. male.  Patient with onset of room spinning at 8:30 this morning. No nausea no headache. Made worse by movement of his head. Patient denies any speech problems any extremity weakness. A past history of vertigo in the past. No recent injury. No upper respiratory infections.      Past Medical History:  Diagnosis Date  . Asthma     Patient Active Problem List   Diagnosis Date Noted  . 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 Family history of congenital or genetic condition 05/11/2015  . Smokes 1/2 pack/day or less 02/19/2015  . Asthma exacerbation 02/07/2015  . Acute respiratory failure with hypoxia ([place]) 02/07/2015  . Hypokalemia 02/07/2015  . Leukocytosis 02/07/2015  . SIRS (systemic inflammatory response syndrome) ([place]) 02/07/2015    Past Surgical History:  Procedure Laterality Date  . MANDIBLE SURGERY    . WISDOM TOOTH EXTRACTION         Home Medications    Prior to Admission medications   Medication Sig Start Date End Date Taking? Authorizing Provider  albuterol (PROVENTIL) (2.5 MG/3ML) 0.083% nebulizer solution USE ONE VIAL IN NEBULIZER EVERY 4 HOURS AS NEEDED FOR WHEEZING Patient taking differently: Take 2.5 mg by nebulization every 4 (four) hours as needed for wheezing or shortness of breath.  02/10/15   [name], MD  budesonide-formoterol [facility]) 160-4.5 MCG/ACT inhaler Inhale 2 puffs into the lungs 2 (two) times daily. 02/10/15   [name], MD  meclizine (ANTIVERT) 25 MG tablet Take 1 tablet (25 mg total) by mouth 3 (three) times daily as needed for dizziness. 05/24/16   Fredia Sorrow, MD  PROVENTIL HFA 108 (90 Base) MCG/ACT inhaler INHALE ONE TO TWO PUFFS INTO LUNGS EVERY 4 HOURS AS NEEDED FOR WHEEZING, SHORTNESS OF  BREATH OR COUGH 02/24/16   [name], MD  SYMBICORT 160-4.5 MCG/ACT inhaler INHALE TWO PUFFS BY MOUTH TWICE DAILY 02/23/16   Melvenia Needles, NP    Family History Family History  Problem Relation Age of Onset  . Cancer Maternal Grandmother     colon  . Asthma Sister     Social History Social History  Substance Use Topics  . Smoking status: Current Every Day Smoker    Packs/day: 0.25    Years: 8.00    Types: Cigarettes    Start date: 02/14/2003  . Smokeless tobacco: Never Used  . Alcohol use No     Allergies   Patient has no known allergies.   Review of Systems Review of Systems  Constitutional: Negative for fever.  HENT: Negative for congestion and sore throat.   Eyes: Positive for visual disturbance.  Respiratory: Negative for shortness of breath.   Cardiovascular: Negative for chest pain.  Gastrointestinal: Negative for abdominal pain.  Genitourinary: Negative for dysuria.  Musculoskeletal: Negative for back pain and neck pain.  Neurological: Positive for dizziness. Negative for seizures, syncope, speech difficulty, weakness, numbness and headaches.  Hematological: Does not bruise/bleed easily.  Psychiatric/Behavioral: Negative for confusion.     Physical Exam Updated Vital Signs BP 112/64 (BP Location: Left Arm)   Pulse 65   Temp 98 F (36.7 C)   Resp 18   Ht _0  (1.727 m)   Wt 86.2 kg   SpO2 98%  BMI 28.89 kg/m   Physical Exam   ED Treatments / Results  Labs (all labs ordered are listed, but only abnormal results are displayed) Labs Reviewed  COMPREHENSIVE METABOLIC PANEL - Abnormal; Notable for the following:       Result Value   Total Bilirubin 0.2 (*)    All other components within normal limits  CBG MONITORING, ED - Abnormal; Notable for the following:    Glucose-Capillary 104 (*)    All other components within normal limits  CBC WITH DIFFERENTIAL/PLATELET    EKG  EKG Interpretation  Date/Time:  Wednesday May 24 2016 10:04:33  EDT Ventricular Rate:  80 PR Interval:    QRS Duration: 81 QT Interval:  369 QTC Calculation: 426 R Axis:   61 Text Interpretation:  Sinus rhythm Borderline short PR interval RSR' in V1 or V2, probably normal variant No significant change since last tracing Confirmed by Rayyan Orsborn  MD, Naudia Crosley [phone]) on 05/24/2016 10:09:53 AM Also confirmed by Rogene Houston  MD, [place] [phone]), editor Drema Pry [phone])  on 05/24/2016 10:27:11 AM       Radiology Ct Head Wo Contrast  Result Date: 05/24/2016 CLINICAL DATA:  Acute dizziness, vertigo EXAM: CT HEAD WITHOUT CONTRAST TECHNIQUE: Contiguous axial images were obtained from the base of the skull through the vertex without intravenous contrast. COMPARISON:  None available FINDINGS: Brain: No evidence of acute infarction, hemorrhage, hydrocephalus, extra-axial collection or mass lesion/mass effect. Vascular: No hyperdense vessel or unexpected calcification. Skull: Normal. Negative for fracture or focal lesion. Sinuses/Orbits: No acute finding. Other: None. IMPRESSION: Normal head CT without contrast. Electronically Signed   By: Jerilynn Mages.  Shick M.D.   On: 05/24/2016 11:41    Procedures Procedures (including critical care time)  Medications Ordered in ED Medications  0.9 %  sodium chloride infusion (not administered)  sodium chloride 0.9 % bolus 1,000 mL (1,000 mLs Intravenous New Bag/Given 05/24/16 1058)  ondansetron (ZOFRAN) injection 4 mg (4 mg Intravenous Given 05/24/16 1105)  meclizine (ANTIVERT) tablet 25 mg (25 mg Oral Given 05/24/16 1104)     Initial Impression / Assessment and Plan / ED Course  I have reviewed the triage vital signs and the nursing notes.  Pertinent labs & imaging results that were available during my care of the patient were reviewed by me and considered in my medical decision making (see chart for details).     Patient symptoms seem to be consistent with vertigo. Head CT negative no other focal neuro deficits. Improvement  with meclizine. Labs without significant abnormalities. Patient nontoxic no acute distress. Patient's functional.  We'll treat with meclizine will give work note have follow-up with neurology.  Final Clinical Impressions(s) / ED Diagnoses   Final diagnoses:  Vertigo    New Prescriptions New Prescriptions   MECLIZINE (ANTIVERT) 25 MG TABLET    Take 1 tablet (25 mg total) by mouth 3 (three) times daily as needed for dizziness.     Fredia Sorrow, MD 05/24/16 1415

## 2016-05-24 NOTE — Discharge Instructions (Signed)
Take the Antivert as directed. Make appointment to follow with neurology. Return for any new or worse symptoms. Work note provided.

## 2016-06-19 ENCOUNTER — Encounter: Payer: Self-pay | Admitting: Pediatrics

## 2016-06-19 ENCOUNTER — Ambulatory Visit (INDEPENDENT_AMBULATORY_CARE_PROVIDER_SITE_OTHER): Payer: Medicaid Other | Admitting: Pediatrics

## 2016-06-19 ENCOUNTER — Telehealth: Payer: Self-pay | Admitting: *Deleted

## 2016-06-19 VITALS — BP 130/76 | HR 77 | Temp 97.1°F | Ht 68.0 in | Wt 200.4 lb

## 2016-06-19 DIAGNOSIS — E669 Obesity, unspecified: Secondary | ICD-10-CM | POA: Insufficient documentation

## 2016-06-19 DIAGNOSIS — R1011 Right upper quadrant pain: Secondary | ICD-10-CM

## 2016-06-19 DIAGNOSIS — F17210 Nicotine dependence, cigarettes, uncomplicated: Secondary | ICD-10-CM | POA: Diagnosis not present

## 2016-06-19 DIAGNOSIS — Q9388 Other microdeletions: Secondary | ICD-10-CM | POA: Diagnosis not present

## 2016-06-19 DIAGNOSIS — J4530 Mild persistent asthma, uncomplicated: Secondary | ICD-10-CM | POA: Diagnosis not present

## 2016-06-19 DIAGNOSIS — L409 Psoriasis, unspecified: Secondary | ICD-10-CM | POA: Diagnosis not present

## 2016-06-19 MED ORDER — TRIAMCINOLONE ACETONIDE 0.1 % EX OINT: 1.0000 "application " | TOPICAL_OINTMENT | Freq: Two times a day (BID) | CUTANEOUS | 0 refills | Status: DC

## 2016-06-19 NOTE — Progress Notes (Signed)
Subjective:   Patient ID: Nicholas Brooks, male    DOB: October 19, 1985, 31 y.o.   MRN: [id number] CC: New Patient (Initial Visit) follow up multiple med problems HPI: Nicholas Brooks is a 31 y.o. male presenting for New Patient (Initial Visit)  Elevated BMI:  Drinks soda in the morning, 6 sodas at night Wants to lose weight, weight has been up and down since divorce Living alone now Snacks on reese cups regularly Is limited with food he can buy due to cost he says, on food stamps  Smoking daily, would like to quit Has tried vapor cig, went to hospital for asthma exacerbation Has tried nicotine patches before, stopped for a month, then restart Too expensive to do the patches Smoking about 10 cig a day now Not smoking inside  Sister called to set up this appointment, called during appt. 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  Pt says reading has been really hard for him Nicholas Brooks someone to do his taxes Says he doesn't know how to write a check, he did well in math in school If he reads slowly can understand things he says  Graduated from HS a year early Says he took 78 and 12grade classes at the same time Says he did ok in [place], thinks he got a C, able to read books as needed to pass English  Was seen by genetics a year ago and had chromosome testing done showing a microdeletion that can cause dev delay, intellectual disability, heart defects Has not yet had an ECHO done  Has cramps in upper abd for months when he bends down  Feels like he has a pressure in his abd Not getting worse Appetite has been fine Eating does not affect pain Bothering him regularly  Past Medical History:  Diagnosis Date  . Asthma    Family History  Problem Relation Age of Onset  . Cancer Maternal Grandmother     colon  . Asthma Sister    Social History   Social  History  . Marital status: Married    Spouse name: N/A  . Number of children: 1  . Years of education: N/A   Occupational History  . FEED MACHINE [employer]   Social History Main Topics  . Smoking status: Current Every Day Smoker    Packs/day: 0.50    Years: 8.00    Types: Cigarettes    Start date: 02/14/2003  . Smokeless tobacco: Never Used  . Alcohol use No  . Drug use: No  . Sexual activity: Yes   Other Topics Concern  . None   Social History Narrative  . None   ROS: All systems negative other than what is in HPI  Objective:    BP 130/76   Pulse 77   Temp 97.1 F (36.2 C) (Oral)   Ht '5\' 8"'  (1.727 m)   Wt 200 lb 6.4 oz (90.9 kg)   BMI 30.47 kg/m   Wt Readings from Last 3 Encounters:  06/19/16 200 lb 6.4 oz (90.9 kg)  05/24/16 190 lb (86.2 kg)  05/15/16 192 lb (87.1 kg)    Gen: NAD, alert, cooperative with exam, NCAT EYES: EOMI, no conjunctival injection, or no icterus ENT:  TMs pearly gray b/l, OP without erythema LYMPH: no cervical LAD CV: NRRR, normal S1/S2, no murmur, distal pulses 2+ b/l Resp: moving air well,  slight b/l wheeze with forced exhalation, normal WOB Abd: +BS, soft, mildly tender RUQ with palpation and epigastric area, ND. no guarding or organomegaly, neg murphys Ext: No edema, warm Neuro: Alert and oriented, strength equal b/l UE and LE, coordination grossly normal MSK: normal muscle bulk Psych: good eye contact Skin: red scaly plaques several cm across R knee, R lateral ankles  Assessment & Plan:  Nicholas Brooks was seen today for new patient (initial visit).  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 Wants neurology evaluation for difficulty with reading, learning, memory problems Deletion associated with cardiac defects, will get ECHO as recommended by genetics -     Ambulatory referral to Neurology -     ECHOCARDIOGRAM COMPLETE; Future  Smokes 1/2 pack/day or less Discussed smoking  strategies, will try gum  Mild persistent asthma without complication Continue symbicort BID Albuterol as needed for wheezing Working on smoking cessation as above  Psoriasis Not on medication now Start below If not improving will refer to dermatology -     triamcinolone ointment (KENALOG) 0.1 %; Apply 1 application topically 2 (two) times daily.  Right upper quadrant abdominal pain TTP RUQ, weight stable, will get [place] -     US Abdomen Complete; Future  Obesity, BMI 30 Decrease regular soda and sweet tea consumption to zero Avoid candy  Follow up plan: 2 mo, sooner if needed Nicholas Found, MD [place]

## 2016-06-19 NOTE — Telephone Encounter (Signed)
Patient's sister, Joice Loftsmber ,says to  ask the provider to do a neurology referral and to understand that her brother has a difficult time reading, writing and remembering.  She is unsure if this is genetic or other issue.

## 2016-06-27 ENCOUNTER — Ambulatory Visit (HOSPITAL_COMMUNITY)
Admission: RE | Admit: 2016-06-27 | Discharge: 2016-06-27 | Disposition: A | Discharge status: Home / Self Care | Payer: Medicaid Other | Source: Ambulatory Visit | Attending: Pediatrics | Admitting: Pediatrics

## 2016-06-27 DIAGNOSIS — R1011 Right upper quadrant pain: Secondary | ICD-10-CM | POA: Diagnosis present

## 2016-07-05 ENCOUNTER — Telehealth: Payer: Self-pay | Admitting: Pediatrics

## 2016-07-05 NOTE — Telephone Encounter (Signed)
Patient aware of normal US results and will call back if does not improve

## 2016-07-18 ENCOUNTER — Ambulatory Visit (HOSPITAL_COMMUNITY): Admission: RE | Admit: 2016-07-18 | Payer: Medicaid Other | Source: Ambulatory Visit

## 2016-07-26 ENCOUNTER — Ambulatory Visit (HOSPITAL_COMMUNITY)
Admission: RE | Admit: 2016-07-26 | Discharge: 2016-07-26 | Disposition: A | Discharge status: Home / Self Care | Payer: Medicaid Other | Source: Ambulatory Visit | Attending: Pediatrics | Admitting: Pediatrics

## 2016-07-26 ENCOUNTER — Telehealth: Payer: Self-pay | Admitting: Pediatrics

## 2016-07-26 DIAGNOSIS — I071 Rheumatic tricuspid insufficiency: Secondary | ICD-10-CM | POA: Insufficient documentation

## 2016-07-26 DIAGNOSIS — Q9388 Other microdeletions: Secondary | ICD-10-CM

## 2016-07-26 LAB — ECHOCARDIOGRAM COMPLETE
AVLVOTPG: 13 mmHg
CHL CUP RV SYS PRESS: 30 mmHg
E/e' ratio: 6.97
EWDT: 222 ms
FS: 42 % (ref 28–44)
IV/PV OW: 1.15
LA diam index: 1.7 cm/m2
LA vol A4C: 33.7 ml
LA vol index: 18.9 mL/m2
LA vol: 39.9 mL
LASIZE: 36 mm
LDCA: 2.84 cm2
LEFT ATRIUM END SYS DIAM: 36 mm
LV E/e' medial: 6.97
LV E/e'average: 6.97
LV PW d: 7.99 mm — AB (ref 0.6–1.1)
LV TDI E'LATERAL: 15.2
LV dias vol index: 47 mL/m2
LV e' LATERAL: 15.2 cm/s
LV sys vol index: 14 mL/m2
LVDIAVOL: 100 mL (ref 62–150)
LVOT SV: 98 mL
LVOT VTI: 34.6 cm
LVOTD: 19 mm
LVOTPV: 177 cm/s
LVSYSVOL: 30 mL (ref 21–61)
MV Dec: 222
MV Peak grad: 4 mmHg
MV pk E vel: 106 m/s
MVPKAVEL: 60.2 m/s
RV LATERAL S' VELOCITY: 12.9 cm/s
Reg peak vel: 262 cm/s
Simpson's disk: 70
Stroke v: 70 ml
TAPSE: 20.3 mm
TDI e' medial: 11.9
TR max vel: 262 cm/s

## 2016-07-26 NOTE — Telephone Encounter (Signed)
Aware of result. 

## 2016-07-26 NOTE — Progress Notes (Signed)
*  PRELIMINARY RESULTS* Echocardiogram 2D Echocardiogram has been performed.  Stacey DrainWhite, Mikyle Sox J 07/26/2016, 10:08 AM

## 2016-08-04 ENCOUNTER — Ambulatory Visit: Payer: Medicaid Other | Admitting: Neurology

## 2016-08-23 ENCOUNTER — Ambulatory Visit (INDEPENDENT_AMBULATORY_CARE_PROVIDER_SITE_OTHER): Payer: Medicaid Other | Admitting: Pediatrics

## 2016-08-23 ENCOUNTER — Encounter: Payer: Self-pay | Admitting: Pediatrics

## 2016-08-23 VITALS — BP 137/80 | HR 74 | Temp 97.2°F | Ht 68.0 in | Wt 199.0 lb

## 2016-08-23 DIAGNOSIS — J069 Acute upper respiratory infection, unspecified: Secondary | ICD-10-CM | POA: Diagnosis not present

## 2016-08-23 DIAGNOSIS — E669 Obesity, unspecified: Secondary | ICD-10-CM

## 2016-08-23 DIAGNOSIS — F17210 Nicotine dependence, cigarettes, uncomplicated: Secondary | ICD-10-CM

## 2016-08-23 DIAGNOSIS — E66811 Obesity, class 1: Secondary | ICD-10-CM

## 2016-08-23 DIAGNOSIS — L409 Psoriasis, unspecified: Secondary | ICD-10-CM | POA: Diagnosis not present

## 2016-08-23 MED ORDER — FLUTICASONE PROPIONATE 50 MCG/ACT NA SUSP: 2.0000 | Freq: Every day | NASAL | 6 refills | Status: DC

## 2016-08-23 MED ORDER — TRIAMCINOLONE ACETONIDE 0.5 % EX OINT: 1.0000 "application " | TOPICAL_OINTMENT | Freq: Two times a day (BID) | CUTANEOUS | 0 refills | Status: DC

## 2016-08-23 MED ORDER — CETIRIZINE HCL 10 MG PO TABS: 10.0000 mg | ORAL_TABLET | Freq: Every day | ORAL | 11 refills | Status: DC

## 2016-08-23 NOTE — Progress Notes (Signed)
  Subjective:   Patient ID: Nicholas Brooks, male    DOB: Feb 09, 1986, 31 y.o.   MRN: [id number] CC: Follow-up (2 month) and cough HPI: Nicholas Brooks is a 31 y.o. male presenting for Follow-up (2 month)  Started coughing, sore throat 3 days ago Missed work one day earlier this week Has been taking day quil and nyquil Sometimes feels wheezy, has been using albuterol inhaler apprx 3 times a day No ear pain No fevers Appetite has been fine No facial pain  Smoking a few cig not every day still trying to quit Using gum  Using steroid ointment on R knee twice a day, still with thick, red psoriasis plaque R knee No other areas now  Lost several pounds cutting back to coke zero, cutting out bread, decreasing sweet/candy intake Restarted eating bread and gained the weight back  Relevant past medical, surgical, family and social history reviewed. Allergies and medications reviewed and updated. History  Smoking Status  . Current Every Day Smoker  . Packs/day: 0.50  . Years: 8.00  . Types: Cigarettes  . Start date: 02/14/2003  Smokeless Tobacco  . Never Used   ROS: Per HPI   Objective:    BP 137/80   Pulse 74   Temp (!) 97.2 F (36.2 C) (Oral)   Ht 5\' 8"  (1.727 m)   Wt 199 lb (90.3 kg)   BMI 30.26 kg/m   Wt Readings from Last 3 Encounters:  08/23/16 199 lb (90.3 kg)  06/19/16 200 lb 6.4 oz (90.9 kg)  05/24/16 190 lb (86.2 kg)    Gen: NAD, alert, cooperative with exam, NCAT, congested EYES: EOMI, no conjunctival injection, or no icterus ENT:  R TM injected, L TM obscured by cerumen, OP without erythema LYMPH: no cervical LAD CV: NRRR, normal S1/S2, no murmur, distal pulses 2+ b/l Resp: CTABL, no wheezes, normal WOB Ext: No edema, warm Neuro: Alert and oriented, strength equal b/l UE and LE, coordination grossly normal MSK: normal muscle bulk Skin: 5cmx3cm raised plaque R knee with some areas of red, covered with white scale  Assessment & Plan:  Nicholas Brooks was seen today  for follow-up med problems and cough  Diagnoses and all orders for this visit:  Acute URI Discussed symptom care, return precautions -     fluticasone (FLONASE) 50 MCG/ACT nasal spray; Place 2 sprays into both nostrils daily. -     cetirizine (ZYRTEC) 10 MG tablet; Take 1 tablet (10 mg total) by mouth daily.  Psoriasis Has been using 0.1% kenalog, increase to 0.5% ointment on knee for next two weeks If not improving let me know -     triamcinolone ointment (KENALOG) 0.5 %; Apply 1 application topically 2 (two) times daily.  Obesity (BMI 30.0-34.9) Cont lifestyle changes, avoid snack  Smokes 1/2 pack/day or less Goal to stop smoking by end of the summer Cont gum as needed Strategies discussed  Follow up plan: Return in about 6 months (around 02/23/2017) for med follow up. Rex Nicholas Jakiyah Stepney, MD [facility]

## 2016-08-23 NOTE — Patient Instructions (Addendum)

## 2016-08-30 ENCOUNTER — Ambulatory Visit (INDEPENDENT_AMBULATORY_CARE_PROVIDER_SITE_OTHER): Payer: Medicaid Other | Admitting: Pulmonary Disease

## 2016-08-30 ENCOUNTER — Encounter: Payer: Self-pay | Admitting: Pulmonary Disease

## 2016-08-30 VITALS — BP 128/82 | HR 89 | Ht 68.0 in | Wt 199.4 lb

## 2016-08-30 DIAGNOSIS — Z720 Tobacco use: Secondary | ICD-10-CM

## 2016-08-30 DIAGNOSIS — J449 Chronic obstructive pulmonary disease, unspecified: Secondary | ICD-10-CM

## 2016-08-30 DIAGNOSIS — R0981 Nasal congestion: Secondary | ICD-10-CM

## 2016-08-30 MED ORDER — ALBUTEROL SULFATE HFA 108 (90 BASE) MCG/ACT IN AERS: INHALATION_SPRAY | RESPIRATORY_TRACT | 3 refills | Status: DC

## 2016-08-30 NOTE — Progress Notes (Signed)
Current Outpatient Prescriptions on File Prior to Visit  Medication Sig  . albuterol (PROVENTIL) (2.5 MG/3ML) 0.083% nebulizer solution USE ONE VIAL IN NEBULIZER EVERY 4 HOURS AS NEEDED FOR WHEEZING (Patient taking differently: Take 2.5 mg by nebulization every 4 (four) hours as needed for wheezing or shortness of breath. )  . budesonide-formoterol (SYMBICORT) 160-4.5 MCG/ACT inhaler Inhale 2 puffs into the lungs 2 (two) times daily.  . cetirizine (ZYRTEC) 10 MG tablet Take 1 tablet (10 mg total) by mouth daily.  . fluticasone (FLONASE) 50 MCG/ACT nasal spray Place 2 sprays into both nostrils daily.  Marland Kitchen. triamcinolone ointment (KENALOG) 0.1 % Apply 1 application topically 2 (two) times daily.  Marland Kitchen. triamcinolone ointment (KENALOG) 0.5 % Apply 1 application topically 2 (two) times daily.   No current facility-administered medications on file prior to visit.      Chief Complaint  Patient presents with  . Follow-up    Pt states that his breathing has been doing well other than being sick x 1 week ago. Pt states that he is still working on getting back to baseline -- still having cough with white mucus.      Pulmonary tests PFT 03/31/15 >> FEV1 3.09 (72%), FEV1% 68, TLC 6.13 (94%), DLCO 96%, no BD  Past medical history Asthma  Past surgical history, Family history, Social history, Allergies reviewed  Vital Signs BP 128/82 (BP Location: Left Arm, Cuff Size: Normal)   Pulse 89   Ht 5\' 8"  (1.727 m)   Wt 199 lb 6.4 oz (90.4 kg)   SpO2 100%   BMI 30.32 kg/m   History of Present Illness Nicholas Brooks is a 31 y.o. male smoker with hx of asthma.  He was doing well until about 1 week ago.  He developed a sinus infection.  He was getting post nasal drip and this was triggering a cough.  He is not having fever.  He has noticed more wheeze and chest tightness over the past week, but this is getting better.  He was using dayquil/nyquil and these helped.  He has been using albuterol bid for past one  week, but needs a refill.  He is using symbicort bid.  He is down to 5 cigarettes per day.  He is planning to become a Company secretaryfireman.    He denies headache, sore throat, fever, skin rash, leg swelling, or abdominal pain.  Physical Exam  General - pleasant Eyes - pupils reactive ENT - no sinus tenderness, no oral exudate, no LAN Cardiac - regular, no murmur Chest - very faint b/l wheeze at end of expiration b/l more in lower lobes Abd - soft, non tender Ext - no edema Skin - no rashes Neuro - normal strength Psych - normal mood   Assessment/Plan  Sinus congestion. - can try nasal irrigation and OTC flonase as needed  COPD with asthma. - continue symbicort - can use albuterol on regular basis for now until symptoms improve - don't think he needs Abx or prednisone at this time  Tobacco abuse. - encouraged him to keep up with his smoking cessation efforts   Patient Instructions  Can try using saline nasal spray until sinus congestion better  Can continue using albuterol as needed until cough is better  Follow up in 1 year    Coralyn HellingVineet Alpa Salvo, MD [facility] Pager:  [phone] 08/30/2016, 9:48 AM

## 2016-08-30 NOTE — Patient Instructions (Signed)
Can try using saline nasal spray until sinus congestion better  Can continue using albuterol as needed until cough is better  Follow up in 1 year

## 2016-09-12 ENCOUNTER — Encounter: Payer: Self-pay | Admitting: Neurology

## 2016-09-12 ENCOUNTER — Ambulatory Visit (INDEPENDENT_AMBULATORY_CARE_PROVIDER_SITE_OTHER): Payer: Self-pay | Admitting: Neurology

## 2016-09-12 VITALS — BP 110/60 | HR 68 | Resp 16 | Ht 68.0 in | Wt 198.0 lb

## 2016-09-12 DIAGNOSIS — Q9388 Other microdeletions: Secondary | ICD-10-CM

## 2016-09-13 ENCOUNTER — Encounter: Payer: Self-pay | Admitting: Neurology

## 2016-09-13 NOTE — Progress Notes (Signed)
The patient was not seen as we do not do neuropsychologic testing in the office. Consider referral to neuropsychology for formal testing if indicated

## 2016-09-29 ENCOUNTER — Telehealth: Payer: Self-pay | Admitting: Pulmonary Disease

## 2016-09-29 MED ORDER — ALBUTEROL SULFATE (2.5 MG/3ML) 0.083% IN NEBU: INHALATION_SOLUTION | RESPIRATORY_TRACT | 5 refills | Status: DC

## 2016-09-29 MED ORDER — ALBUTEROL SULFATE HFA 108 (90 BASE) MCG/ACT IN AERS: INHALATION_SPRAY | RESPIRATORY_TRACT | 5 refills | Status: DC

## 2016-09-29 MED ORDER — BUDESONIDE-FORMOTEROL FUMARATE 160-4.5 MCG/ACT IN AERO: 2.0000 | INHALATION_SPRAY | Freq: Two times a day (BID) | RESPIRATORY_TRACT | 5 refills | Status: DC

## 2016-09-29 NOTE — Telephone Encounter (Signed)
Called and spoke to pt. Pt is requesting albuterol hfa, neb and Symbicort. Rx sent to preferred pharmacy. Pt verbalized understanding and denied any further questions or concerns at this time.

## 2016-10-03 ENCOUNTER — Telehealth: Payer: Self-pay | Admitting: Adult Health

## 2016-10-03 NOTE — Telephone Encounter (Signed)
Received PA request for Symbicort 160. PA was approved through [employer]. PA# is [id number]. [facility] on [address] is aware of the approval.

## 2016-10-09 ENCOUNTER — Emergency Department (HOSPITAL_BASED_OUTPATIENT_CLINIC_OR_DEPARTMENT_OTHER)
Admission: EM | Admit: 2016-10-09 | Discharge: 2016-10-09 | Disposition: A | Discharge status: Home / Self Care | Payer: Medicaid Other | Attending: Emergency Medicine | Admitting: Emergency Medicine

## 2016-10-09 DIAGNOSIS — J45901 Unspecified asthma with (acute) exacerbation: Secondary | ICD-10-CM

## 2016-10-09 DIAGNOSIS — F17210 Nicotine dependence, cigarettes, uncomplicated: Secondary | ICD-10-CM | POA: Insufficient documentation

## 2016-10-09 MED ORDER — ALBUTEROL SULFATE (2.5 MG/3ML) 0.083% IN NEBU: INHALATION_SOLUTION | RESPIRATORY_TRACT | 0 refills | Status: DC

## 2016-10-09 MED ORDER — IPRATROPIUM-ALBUTEROL 0.5-2.5 (3) MG/3ML IN SOLN
3.0000 mL | Freq: Once | RESPIRATORY_TRACT | Status: AC
  Administered 2016-10-09: 3 mL via RESPIRATORY_TRACT
  Filled 2016-10-09: qty 3

## 2016-10-09 MED ORDER — PREDNISONE 10 MG PO TABS: 40.0000 mg | ORAL_TABLET | Freq: Every day | ORAL | 0 refills | Status: DC

## 2016-10-09 MED ORDER — ALBUTEROL SULFATE (2.5 MG/3ML) 0.083% IN NEBU
2.5000 mg | INHALATION_SOLUTION | Freq: Once | RESPIRATORY_TRACT | Status: AC
  Administered 2016-10-09: 2.5 mg via RESPIRATORY_TRACT
  Filled 2016-10-09: qty 3

## 2016-10-09 MED ORDER — PREDNISONE 20 MG PO TABS
40.0000 mg | ORAL_TABLET | Freq: Once | ORAL | Status: AC
  Administered 2016-10-09: 40 mg via ORAL
  Filled 2016-10-09: qty 2

## 2016-10-09 MED ORDER — ALBUTEROL SULFATE HFA 108 (90 BASE) MCG/ACT IN AERS
INHALATION_SPRAY | RESPIRATORY_TRACT | Status: AC
  Administered 2016-10-09: 4 via RESPIRATORY_TRACT
  Filled 2016-10-09: qty 6.7

## 2016-10-09 MED ORDER — ALBUTEROL SULFATE HFA 108 (90 BASE) MCG/ACT IN AERS
2.0000 | INHALATION_SPRAY | RESPIRATORY_TRACT | Status: DC
  Administered 2016-10-09: 4 via RESPIRATORY_TRACT

## 2016-10-09 NOTE — ED Notes (Signed)
DC instructions and Rx provided to pt, Pt teaching done on MDI w/ spacer by RT staff. Opportunity for questions provided.

## 2016-10-09 NOTE — ED Triage Notes (Signed)
Pt had asthma flare that started Saturday.  Pt states he ran out of his inhaler.  He had borrowed an atrovent nebulizer which did not work.

## 2016-10-09 NOTE — ED Provider Notes (Signed)
[place] DEPT [facility] Provider Note   CSN: [id number] Arrival date & time: 10/09/16  0905     History   Chief Complaint Chief Complaint  Patient presents with  . Shortness of Breath    HPI Nicholas Brooks is a 31 y.o. male.  The history is provided by the patient. No language interpreter was used.  Shortness of Breath     Nicholas Brooks is a 31 y.o. male who presents to the Emergency Department complaining of sob.  He has a history of asthma and reports increased shortness of breath over the last week.  He states that earlier in the week he did have his home medications and he did get temporary improvement with the albuterol only to have recurrent symptoms. He is now out of his medications. He has a nonproductive cough. He denies any fevers, chest pain, abdominal pain.No leg swelling or pain. He does continue to smoke.  Past Medical History:  Diagnosis Date  . Asthma   . 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 Vision abnormalities     Patient Active Problem List   Diagnosis Date Noted  . Psoriasis 06/19/2016  . Obesity (BMI 30.0-34.9) 06/19/2016  . 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 Family history of congenital or genetic condition 05/11/2015  . Smokes 1/2 pack/day or less 02/19/2015  . Asthma 02/07/2015    Past Surgical History:  Procedure Laterality Date  . MANDIBLE SURGERY    . WISDOM TOOTH EXTRACTION         Home Medications    Prior to Admission medications   Medication Sig Start Date End Date Taking? Authorizing Provider  albuterol (PROVENTIL HFA) 108 (90 Base) MCG/ACT inhaler INHALE ONE TO TWO PUFFS INTO LUNGS EVERY 4 HOURS AS NEEDED FOR WHEEZING, SHORTNESS OF BREATH OR COUGH 09/29/16   [name], MD  albuterol (PROVENTIL) (2.5 MG/3ML) 0.083% nebulizer solution USE ONE VIAL IN NEBULIZER EVERY 4 HOURS AS NEEDED FOR WHEEZING 10/09/16   Quintella Reichert, MD  budesonide-formoterol [facility]) 160-4.5 MCG/ACT inhaler Inhale 2 puffs  into the lungs 2 (two) times daily. 09/29/16   [name], MD  predniSONE (DELTASONE) 10 MG tablet Take 4 tablets (40 mg total) by mouth daily. 10/09/16   Quintella Reichert, MD  triamcinolone ointment (KENALOG) 0.5 % Apply 1 application topically 2 (two) times daily. 08/23/16   Eustaquio Maize, MD    Family History Family History  Problem Relation Age of Onset  . Cancer Maternal Grandmother        colon  . Asthma Sister   . Healthy Mother     Social History Social History  Substance Use Topics  . Smoking status: Current Every Day Smoker    Packs/day: 0.50    Years: 8.00    Types: Cigarettes    Start date: 02/14/2003  . Smokeless tobacco: Never Used     Comment: 1/4 -1/2 PPD (08/30/16)  . Alcohol use No     Allergies   Patient has no known allergies.   Review of Systems Review of Systems  Respiratory: Positive for shortness of breath.   All other systems reviewed and are negative.    Physical Exam Updated Vital Signs BP 109/74 (BP Location: Right Arm)   Pulse 83   Temp 97.8 F (36.6 C) (Oral)   Resp 12   Ht '5\' 8"'  (1.727 m)   Wt 90.7 kg (200 lb)   SpO2 100%   BMI 30.41 kg/m   Physical Exam  Constitutional: He  is oriented to person, place, and time. He appears well-developed and well-nourished.  HENT:  Head: Normocephalic and atraumatic.  Cardiovascular: Normal rate and regular rhythm.   No murmur heard. Pulmonary/Chest: Effort normal and breath sounds normal. No respiratory distress.  Abdominal: Soft. There is no tenderness. There is no rebound and no guarding.  Musculoskeletal: He exhibits no edema or tenderness.  Neurological: He is alert and oriented to person, place, and time.  Skin: Skin is warm and dry.  Psychiatric: He has a normal mood and affect. His behavior is normal.  Nursing note and vitals reviewed.    ED Treatments / Results  Labs (all labs ordered are listed, but only abnormal results are displayed) Labs Reviewed - No data to  display  EKG  EKG Interpretation None       Radiology No results found.  Procedures Procedures (including critical care time)  Medications Ordered in ED Medications  predniSONE (DELTASONE) tablet 40 mg (not administered)  albuterol (PROVENTIL HFA;VENTOLIN HFA) 108 (90 Base) MCG/ACT inhaler (not administered)  ipratropium-albuterol (DUONEB) 0.5-2.5 (3) MG/3ML nebulizer solution 3 mL (3 mLs Nebulization Given 10/09/16 0918)  albuterol (PROVENTIL) (2.5 MG/3ML) 0.083% nebulizer solution 2.5 mg (2.5 mg Nebulization Given 10/09/16 0917)     Initial Impression / Assessment and Plan / ED Course  I have reviewed the triage vital signs and the nursing notes.  Pertinent labs & imaging results that were available during my care of the patient were reviewed by me and considered in my medical decision making (see chart for details).     Patient with history of asthma here with increased shortness of breath, out of his home medications. On ED arrival per respiratory therapy he did have some wheezing and decreased air movement. After albuterol neb times one he is feeling improved with clear lungs bilaterally and good air movement, no respiratory distress. Presentation is not consistent with pneumothorax, PE, pneumonia, CHF. Consultation on homecare for asthma exacerbation. Providing steroid burst, refills on his albuterol nebulizers and an MDI to go home with. Discussed outpatient follow-up and return precautions.  Final Clinical Impressions(s) / ED Diagnoses   Final diagnoses:  Exacerbation of asthma, unspecified asthma severity, unspecified whether persistent    New Prescriptions New Prescriptions   PREDNISONE (DELTASONE) 10 MG TABLET    Take 4 tablets (40 mg total) by mouth daily.     Quintella Reichert, MD 10/09/16 1003

## 2016-11-22 ENCOUNTER — Emergency Department (HOSPITAL_BASED_OUTPATIENT_CLINIC_OR_DEPARTMENT_OTHER)
Admission: EM | Admit: 2016-11-22 | Discharge: 2016-11-22 | Disposition: A | Discharge status: Home / Self Care | Payer: Self-pay | Attending: Emergency Medicine | Admitting: Emergency Medicine

## 2016-11-22 ENCOUNTER — Encounter (HOSPITAL_BASED_OUTPATIENT_CLINIC_OR_DEPARTMENT_OTHER): Payer: Self-pay | Admitting: *Deleted

## 2016-11-22 ENCOUNTER — Emergency Department (HOSPITAL_BASED_OUTPATIENT_CLINIC_OR_DEPARTMENT_OTHER): Payer: Self-pay

## 2016-11-22 DIAGNOSIS — Y929 Unspecified place or not applicable: Secondary | ICD-10-CM | POA: Insufficient documentation

## 2016-11-22 DIAGNOSIS — Y939 Activity, unspecified: Secondary | ICD-10-CM | POA: Insufficient documentation

## 2016-11-22 DIAGNOSIS — F17210 Nicotine dependence, cigarettes, uncomplicated: Secondary | ICD-10-CM | POA: Insufficient documentation

## 2016-11-22 DIAGNOSIS — Z79899 Other long term (current) drug therapy: Secondary | ICD-10-CM | POA: Insufficient documentation

## 2016-11-22 DIAGNOSIS — S92401A Displaced unspecified fracture of right great toe, initial encounter for closed fracture: Secondary | ICD-10-CM

## 2016-11-22 DIAGNOSIS — S92424A Nondisplaced fracture of distal phalanx of right great toe, initial encounter for closed fracture: Secondary | ICD-10-CM | POA: Insufficient documentation

## 2016-11-22 DIAGNOSIS — J45909 Unspecified asthma, uncomplicated: Secondary | ICD-10-CM | POA: Insufficient documentation

## 2016-11-22 DIAGNOSIS — S92402A Displaced unspecified fracture of left great toe, initial encounter for closed fracture: Secondary | ICD-10-CM

## 2016-11-22 DIAGNOSIS — Y999 Unspecified external cause status: Secondary | ICD-10-CM | POA: Insufficient documentation

## 2016-11-22 DIAGNOSIS — X58XXXA Exposure to other specified factors, initial encounter: Secondary | ICD-10-CM | POA: Insufficient documentation

## 2016-11-22 IMAGING — DX DG FOOT COMPLETE 3+V*R*
3 series · 3 of 3 positions shown · non-contrast
Comparison: None.

CLINICAL DATA: Right first and second toe pain, bruising, and
swelling.

EXAM:
RIGHT FOOT COMPLETE - 3+ VIEW

[foot ap]
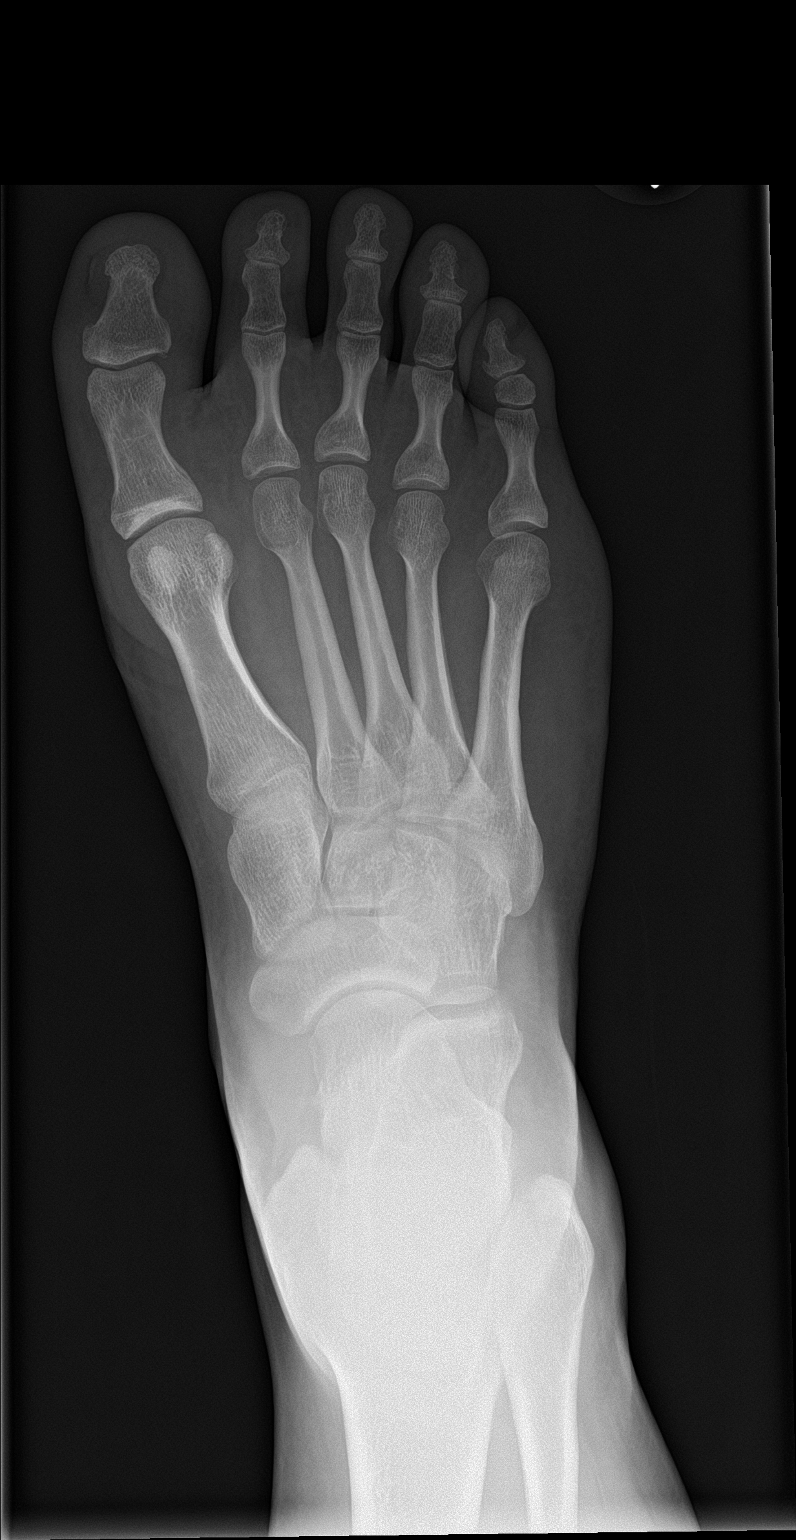

[foot obl]
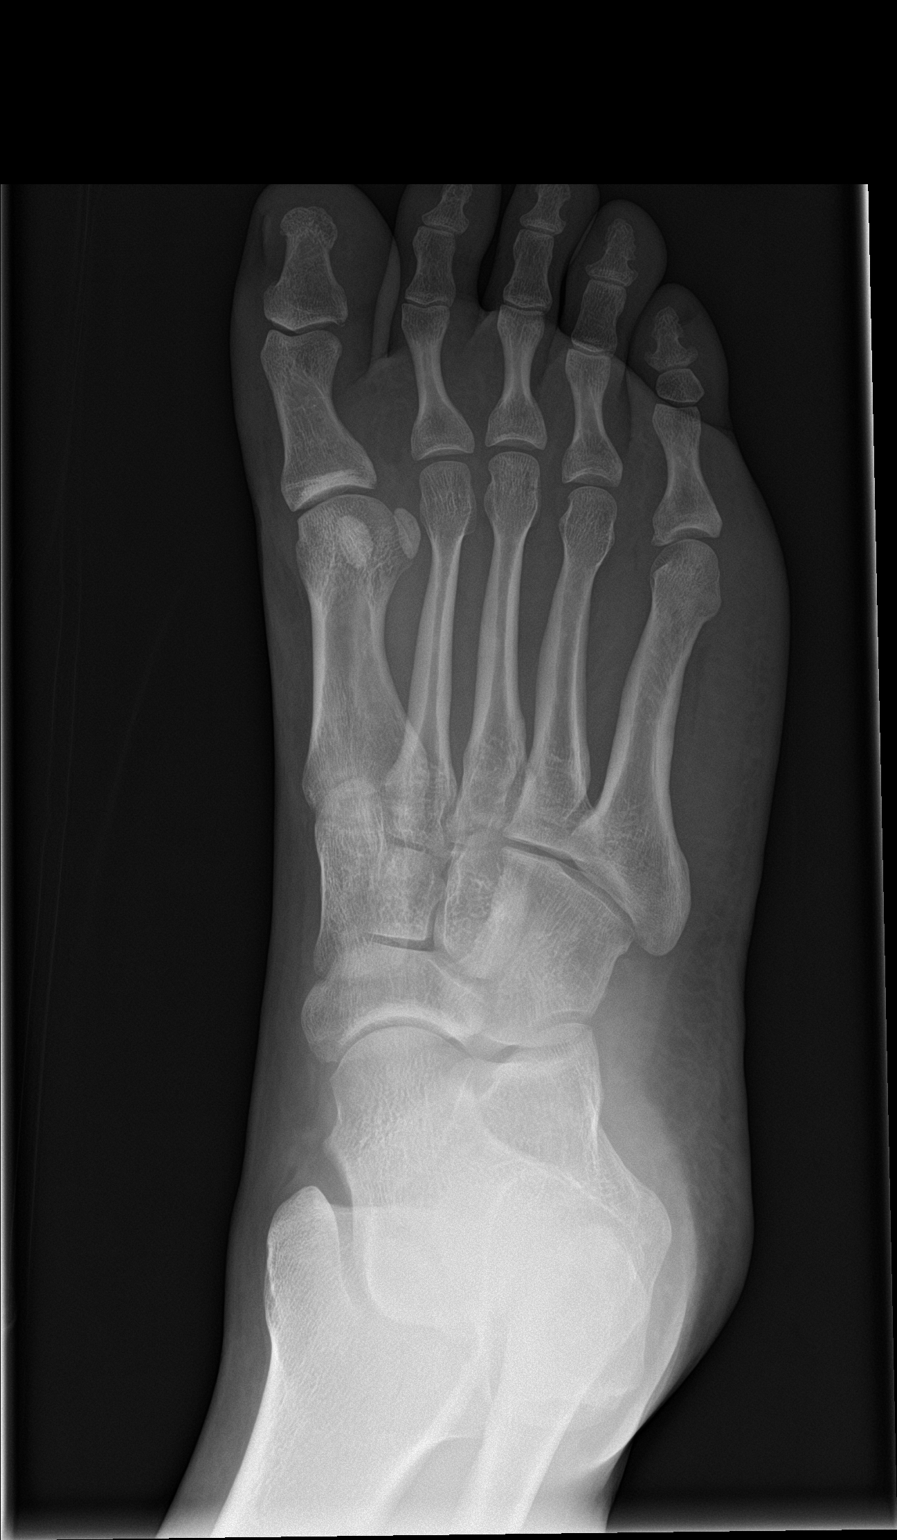

[foot lat]
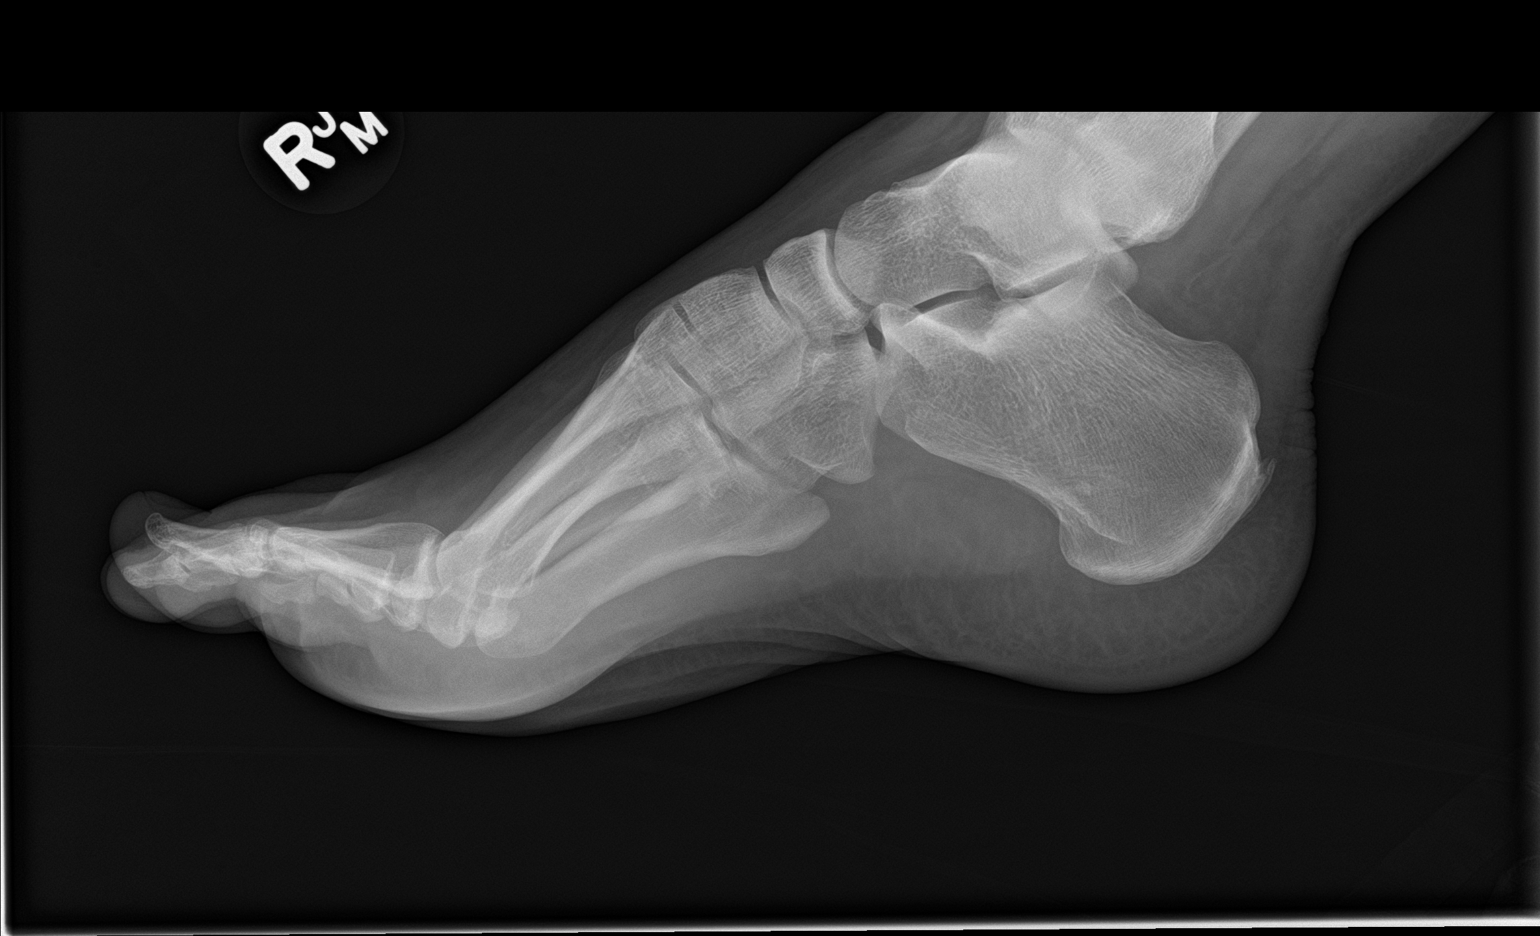

[3 of 3 positions shown; findings below may reference images not displayed]

FINDINGS: There is minimal cortical irregularity along the lateral articular
surface of the base of the great toe distal phalanx for which a
tiny, minimally displaced avulsion fracture is questioned. No
fracture is identified elsewhere. There is no dislocation. Joint
space widths are preserved. A small posterior calcaneal enthesophyte
is noted. No focal soft tissue abnormality is seen.
IMPRESSION: 1. Possible tiny avulsion fracture of the base of the great toe
distal phalanx.
2. No other evidence of acute osseous abnormality.

## 2016-11-22 MED ORDER — NAPROXEN 500 MG PO TABS: 500.0000 mg | ORAL_TABLET | Freq: Two times a day (BID) | ORAL | 0 refills | Status: DC

## 2016-11-22 NOTE — ED Provider Notes (Signed)
[facility] DEPT [facility] Provider Note   CSN: [id number] Arrival date & time: 11/22/16  3291     History   Chief Complaint Chief Complaint  Patient presents with  . Toe Injury    HPI Nicholas Brooks is a 31 y.o. male.  Patient with complaint of right foot great toe and second toe pain. Patient thinks he stubbed his foot. Toenail came off on the right second toe. No recollection of any direct injury. Patient states is also bruising to the right great toe.      Past Medical History:  Diagnosis Date  . Asthma   . 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 Vision abnormalities     Patient Active Problem List   Diagnosis Date Noted  . Psoriasis 06/19/2016  . Obesity (BMI 30.0-34.9) 06/19/2016  . 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 Family history of congenital or genetic condition 05/11/2015  . Smokes 1/2 pack/day or less 02/19/2015  . Asthma 02/07/2015    Past Surgical History:  Procedure Laterality Date  . MANDIBLE SURGERY    . WISDOM TOOTH EXTRACTION         Home Medications    Prior to Admission medications   Medication Sig Start Date End Date Taking? Authorizing Provider  albuterol (PROVENTIL HFA) 108 (90 Base) MCG/ACT inhaler INHALE ONE TO TWO PUFFS INTO LUNGS EVERY 4 HOURS AS NEEDED FOR WHEEZING, SHORTNESS OF BREATH OR COUGH 09/29/16  Yes [name], MD  albuterol (PROVENTIL) (2.5 MG/3ML) 0.083% nebulizer solution USE ONE VIAL IN NEBULIZER EVERY 4 HOURS AS NEEDED FOR WHEEZING 10/09/16  Yes Quintella Reichert, MD  budesonide-formoterol [facility]) 160-4.5 MCG/ACT inhaler Inhale 2 puffs into the lungs 2 (two) times daily. 09/29/16  Yes [name], MD  triamcinolone ointment (KENALOG) 0.5 % Apply 1 application topically 2 (two) times daily. 08/23/16  Yes Eustaquio Maize, MD  naproxen (NAPROSYN) 500 MG tablet Take 1 tablet (500 mg total) by mouth 2 (two) times daily. 11/22/16   Fredia Sorrow, MD  predniSONE (DELTASONE) 10 MG tablet Take 4  tablets (40 mg total) by mouth daily. 10/09/16   Quintella Reichert, MD    Family History Family History  Problem Relation Age of Onset  . Cancer Maternal Grandmother        colon  . Asthma Sister   . Healthy Mother     Social History Social History  Substance Use Topics  . Smoking status: Current Every Day Smoker    Packs/day: 0.50    Years: 8.00    Types: Cigarettes    Start date: 02/14/2003  . Smokeless tobacco: Never Used     Comment: 1/4 -1/2 PPD (08/30/16)  . Alcohol use No     Allergies   Patient has no known allergies.   Review of Systems Review of Systems  Constitutional: Negative for fever.  HENT: Negative for congestion.   Eyes: Negative for redness.  Respiratory: Negative for shortness of breath.   Gastrointestinal: Negative for abdominal pain.  Musculoskeletal: Positive for joint swelling. Negative for back pain and neck pain.  Skin: Positive for wound.  Neurological: Negative for syncope.  Hematological: Does not bruise/bleed easily.  Psychiatric/Behavioral: Negative for confusion.     Physical Exam Updated Vital Signs BP 124/79 (BP Location: Right Arm)   Pulse 82   Temp 97.8 F (36.6 C) (Oral)   Resp 16   Ht 1.727 m ('5\' 8"' )   Wt 90.7 kg (200 lb)   SpO2 100%   BMI 30.41 kg/m  Physical Exam  Constitutional: He is oriented to person, place, and time. He appears well-developed and well-nourished. No distress.  HENT:  Head: Normocephalic and atraumatic.  Eyes: Pupils are equal, round, and reactive to light. Conjunctivae are normal.  Neck: Neck supple.  Cardiovascular: Normal rate and regular rhythm.   Pulmonary/Chest: Effort normal and breath sounds normal. No respiratory distress.  Abdominal: Soft. There is no tenderness.  Musculoskeletal: He exhibits edema and tenderness.  Normal except for right foot evidence of bruising and contusion to the base of the right great toe. Toenails missing from the second toe. No active bleeding. Tenderness to  palpation to the right great toe. Also some swelling to that area. Dorsalis pedis pulses 2+. No ankle tenderness no proximal foot tenderness no leg tenderness.  Neurological: He is alert and oriented to person, place, and time. No cranial nerve deficit or sensory deficit. He exhibits normal muscle tone. Coordination normal.  Skin: Skin is warm. No rash noted.  Nursing note and vitals reviewed.    ED Treatments / Results  Labs (all labs ordered are listed, but only abnormal results are displayed) Labs Reviewed - No data to display  EKG  EKG Interpretation None       Radiology Dg Foot Complete Right  Result Date: 11/22/2016 CLINICAL DATA:  Right first and second toe pain, bruising, and swelling. EXAM: RIGHT FOOT COMPLETE - 3+ VIEW COMPARISON:  None. FINDINGS: There is minimal cortical irregularity along the lateral articular surface of the base of the great toe distal phalanx for which a tiny, minimally displaced avulsion fracture is questioned. No fracture is identified elsewhere. There is no dislocation. Joint space widths are preserved. A small posterior calcaneal enthesophyte is noted. No focal soft tissue abnormality is seen. IMPRESSION: 1. Possible tiny avulsion fracture of the base of the great toe distal phalanx. 2. No other evidence of acute osseous abnormality. Electronically Signed   By: Logan Bores M.D.   On: 11/22/2016 09:28    Procedures Procedures (including critical care time)  Medications Ordered in ED Medications - No data to display   Initial Impression / Assessment and Plan / ED Course  I have reviewed the triage vital signs and the nursing notes.  Pertinent labs & imaging results that were available during my care of the patient were reviewed by me and considered in my medical decision making (see chart for details).    X-rays consistent with avulsion fracture to the base of the right great toe. How will treat with postop shoe and Naprosyn and follow-up with  sports medicine. Work note provided. The patient states tetanus is up-to-date.   Final Clinical Impressions(s) / ED Diagnoses   Final diagnoses:  Closed fracture of phalanx of both great toes, initial encounter    New Prescriptions New Prescriptions   NAPROXEN (NAPROSYN) 500 MG TABLET    Take 1 tablet (500 mg total) by mouth 2 (two) times daily.     Fredia Sorrow, MD 11/22/16 1039

## 2016-11-22 NOTE — ED Notes (Signed)
Patient transported to X-ray 

## 2016-11-22 NOTE — Discharge Instructions (Signed)
Take the Naprosyn on a regular basis. Make an appointment to follow-up with sports medicine upstairs. Work note provided. Wear the postop shoe.

## 2016-11-22 NOTE — ED Triage Notes (Signed)
Pt states he noticed R toe pain while at work yesterday -- denies known injury. Present to ED today with R 1st and 2nd toe bruising and swelling. Toenail missing from 2nd toe. Pt able to ambulate but states it's painful.

## 2016-11-22 NOTE — ED Notes (Signed)
Work note given

## 2016-11-22 NOTE — ED Notes (Signed)
ED Provider at bedside. 

## 2016-12-11 ENCOUNTER — Encounter (HOSPITAL_BASED_OUTPATIENT_CLINIC_OR_DEPARTMENT_OTHER): Payer: Self-pay | Admitting: *Deleted

## 2016-12-11 ENCOUNTER — Emergency Department (HOSPITAL_BASED_OUTPATIENT_CLINIC_OR_DEPARTMENT_OTHER)
Admission: EM | Admit: 2016-12-11 | Discharge: 2016-12-11 | Disposition: A | Discharge status: Home / Self Care | Payer: Self-pay | Attending: Emergency Medicine | Admitting: Emergency Medicine

## 2016-12-11 DIAGNOSIS — F17210 Nicotine dependence, cigarettes, uncomplicated: Secondary | ICD-10-CM | POA: Insufficient documentation

## 2016-12-11 DIAGNOSIS — H1032 Unspecified acute conjunctivitis, left eye: Secondary | ICD-10-CM | POA: Insufficient documentation

## 2016-12-11 DIAGNOSIS — Z79899 Other long term (current) drug therapy: Secondary | ICD-10-CM | POA: Insufficient documentation

## 2016-12-11 DIAGNOSIS — J45909 Unspecified asthma, uncomplicated: Secondary | ICD-10-CM | POA: Insufficient documentation

## 2016-12-11 MED ORDER — BACITRACIN-POLYMYXIN B 500-10000 UNIT/GM OP OINT: TOPICAL_OINTMENT | OPHTHALMIC | 0 refills | Status: DC

## 2016-12-11 MED ORDER — IBUPROFEN 800 MG PO TABS
800.0000 mg | ORAL_TABLET | Freq: Once | ORAL | Status: AC
  Administered 2016-12-11: 800 mg via ORAL
  Filled 2016-12-11: qty 1

## 2016-12-11 NOTE — ED Provider Notes (Signed)
[place] EMERGENCY DEPARTMENT Provider Note   CSN: [id number] Arrival date & time: 12/11/16  1306     History   Chief Complaint Chief Complaint  Patient presents with  . Eye Problem    HPI Nicholas Brooks is a 31 y.o. male.  The history is provided by the patient. No language interpreter was used.  Eye Problem   This is a new problem. The current episode started yesterday. The problem occurs constantly. The problem has been gradually worsening. There is a problem in the left eye. There was no injury mechanism. There is known exposure to pink eye. He wears contacts. Associated symptoms include photophobia and eye redness. Pertinent negatives include no numbness, no blurred vision, no decreased vision, no discharge, no double vision, no foreign body sensation, no nausea, no vomiting, no tingling, no weakness and no itching. Associated symptoms comments: watering. He has tried nothing for the symptoms.    Past Medical History:  Diagnosis Date  . Asthma   . 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 Vision abnormalities     Patient Active Problem List   Diagnosis Date Noted  . Psoriasis 06/19/2016  . Obesity (BMI 30.0-34.9) 06/19/2016  . 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 Family history of congenital or genetic condition 05/11/2015  . Smokes 1/2 pack/day or less 02/19/2015  . Asthma 02/07/2015    Past Surgical History:  Procedure Laterality Date  . MANDIBLE SURGERY    . WISDOM TOOTH EXTRACTION         Home Medications    Prior to Admission medications   Medication Sig Start Date End Date Taking? Authorizing Provider  albuterol (PROVENTIL HFA) 108 (90 Base) MCG/ACT inhaler INHALE ONE TO TWO PUFFS INTO LUNGS EVERY 4 HOURS AS NEEDED FOR WHEEZING, SHORTNESS OF BREATH OR COUGH 09/29/16   [name], MD  albuterol (PROVENTIL) (2.5 MG/3ML) 0.083% nebulizer solution USE ONE VIAL IN NEBULIZER EVERY 4 HOURS AS NEEDED FOR WHEEZING 10/09/16    Quintella Reichert, MD  bacitracin-polymyxin b (POLYSPORIN) ophthalmic ointment Apply to left eye every 6 hours while awake for 5 days 12/11/16   Advith Martine A, PA-C  budesonide-formoterol (SYMBICORT) 160-4.5 MCG/ACT inhaler Inhale 2 puffs into the lungs 2 (two) times daily. 09/29/16   [name], MD  naproxen (NAPROSYN) 500 MG tablet Take 1 tablet (500 mg total) by mouth 2 (two) times daily. 11/22/16   Fredia Sorrow, MD  triamcinolone ointment (KENALOG) 0.5 % Apply 1 application topically 2 (two) times daily. 08/23/16   Eustaquio Maize, MD    Family History Family History  Problem Relation Age of Onset  . Cancer Maternal Grandmother        colon  . Asthma Sister   . Healthy Mother     Social History Social History  Substance Use Topics  . Smoking status: Current Every Day Smoker    Packs/day: 0.50    Years: 8.00    Types: Cigarettes    Start date: 02/14/2003  . Smokeless tobacco: Never Used     Comment: 1/4 -1/2 PPD (08/30/16)  . Alcohol use No     Allergies   Patient has no known allergies.   Review of Systems Review of Systems  Constitutional: Negative for chills and fever.  HENT: Negative for ear discharge, ear pain, sinus pain and sinus pressure.   Eyes: Positive for photophobia, redness and visual disturbance. Negative for blurred vision, double vision, pain, discharge and itching.  Gastrointestinal: Negative for nausea and vomiting.  Skin: Negative for itching.  Allergic/Immunologic: Negative for immunocompromised state.  Neurological: Positive for headaches. Negative for tingling, weakness and numbness.     Physical Exam Updated Vital Signs BP 130/84   Pulse (!) 102   Temp 98 F (36.7 C) (Oral)   Resp 16   Ht '5\' 8"'  (1.727 m)   Wt 90.7 kg (200 lb)   SpO2 99%   BMI 30.41 kg/m   Physical Exam  Constitutional: He appears well-developed.  HENT:  Head: Normocephalic.  Eyes: Pupils are equal, round, and reactive to light. EOM and lids are normal. Lids  are everted and swept, no foreign bodies found. Right eye exhibits no exudate and no hordeolum. No foreign body present in the right eye. Left eye exhibits chemosis. Left eye exhibits no exudate and no hordeolum. No foreign body present in the left eye. Right conjunctiva is not injected. Right conjunctiva has no hemorrhage. Left conjunctiva is injected. Left conjunctiva has no hemorrhage. Right eye exhibits no nystagmus. Left eye exhibits no nystagmus.  Neck: Neck supple.  Cardiovascular: Normal rate and regular rhythm.   No murmur heard. Pulmonary/Chest: Effort normal.  Abdominal: Soft. He exhibits no distension.  Neurological: He is alert.  Skin: Skin is warm and dry.  Psychiatric: His behavior is normal.  Nursing note and vitals reviewed.    ED Treatments / Results  Labs (all labs ordered are listed, but only abnormal results are displayed) Labs Reviewed - No data to display  EKG  EKG Interpretation None       Radiology No results found.  Procedures Procedures (including critical care time)  Medications Ordered in ED Medications  ibuprofen (ADVIL,MOTRIN) tablet 800 mg (800 mg Oral Given 12/11/16 1538)     Initial Impression / Assessment and Plan / ED Course  I have reviewed the triage vital signs and the nursing notes.  Pertinent labs & imaging results that were available during my care of the patient were reviewed by me and considered in my medical decision making (see chart for details).     31 year old male presenting with left eye redness and tearing that began yesterday.  He was recently exposed to bacterial conjunctivitis.  He denies symptoms in the right eyes. His eye was not matted shut this AM. He wears contacts.  On physical exam, the eye is injected. No hordeolum or chalazion noted.  Doubt corneal abrasion or deep eye infection at this time.  We will discharge the patient with bacitracin polymyxin since he is a contact lens wearer.  Instructed the patient to  follow-up with ophthalmology if his symptoms do not improve within the next 5 days.  Strict return precautions given.  No acute distress.  The patient is safe for discharge at this time.  Final Clinical Impressions(s) / ED Diagnoses   Final diagnoses:  Acute conjunctivitis of left eye, unspecified acute conjunctivitis type    New Prescriptions Discharge Medication List as of 12/11/2016  4:00 PM    START taking these medications   Details  bacitracin-polymyxin b (POLYSPORIN) ophthalmic ointment Apply to left eye every 6 hours while awake for 5 days, Print         Joanne Gavel, PA-C 12/13/16 7517    Fredia Sorrow, MD 12/16/16 [phone]

## 2016-12-11 NOTE — ED Triage Notes (Signed)
Pt c/o left eye drainage and redness x 2 days

## 2016-12-11 NOTE — Discharge Instructions (Signed)
Apply a 0.5 inch (approximately the size of the tip of your pinky) to your left eye every 6 hours while you are awake for the next 5-7 days.  If your symptoms do not appear to be improving by the fifth day, please follow-up with your ophthalmologist. 800 mg of ibuprofen can be taken every 8 hours with food to help with pain and inflammation.   Please make sure to wash her hands with soap and water after touching her face or touching her eye.  Your symptoms are contagious.  If you develop worsening symptoms, such as swelling, redness around the eye, double vision, pain with movement of the eye, fever, or other concerning symptoms, please return to the emergency department for reevaluation.

## 2016-12-11 NOTE — ED Notes (Signed)
ED Provider at bedside. 

## 2017-02-23 ENCOUNTER — Ambulatory Visit: Payer: Medicaid Other | Admitting: Pediatrics

## 2017-04-05 ENCOUNTER — Emergency Department (HOSPITAL_BASED_OUTPATIENT_CLINIC_OR_DEPARTMENT_OTHER)
Admission: EM | Admit: 2017-04-05 | Discharge: 2017-04-06 | Disposition: A | Discharge status: Home / Self Care | Payer: Self-pay | Attending: Emergency Medicine | Admitting: Emergency Medicine

## 2017-04-05 ENCOUNTER — Other Ambulatory Visit: Payer: Self-pay

## 2017-04-05 ENCOUNTER — Encounter (HOSPITAL_BASED_OUTPATIENT_CLINIC_OR_DEPARTMENT_OTHER): Payer: Self-pay | Admitting: Emergency Medicine

## 2017-04-05 DIAGNOSIS — Z5321 Procedure and treatment not carried out due to patient leaving prior to being seen by health care provider: Secondary | ICD-10-CM | POA: Insufficient documentation

## 2017-04-05 DIAGNOSIS — M791 Myalgia, unspecified site: Secondary | ICD-10-CM | POA: Insufficient documentation

## 2017-04-05 NOTE — ED Triage Notes (Signed)
Patient states that he has had generalized aches since this AM  - he reports that he also has had abdominal pain and vomited x 1 - the patient reports that he also had a Headache and lower back pain

## 2017-04-06 ENCOUNTER — Emergency Department (HOSPITAL_BASED_OUTPATIENT_CLINIC_OR_DEPARTMENT_OTHER): Payer: Self-pay

## 2017-04-06 ENCOUNTER — Encounter (HOSPITAL_BASED_OUTPATIENT_CLINIC_OR_DEPARTMENT_OTHER): Payer: Self-pay | Admitting: Emergency Medicine

## 2017-04-06 ENCOUNTER — Emergency Department (HOSPITAL_BASED_OUTPATIENT_CLINIC_OR_DEPARTMENT_OTHER)
Admission: EM | Admit: 2017-04-06 | Discharge: 2017-04-06 | Disposition: A | Discharge status: Home / Self Care | Payer: Self-pay | Attending: Emergency Medicine | Admitting: Emergency Medicine

## 2017-04-06 ENCOUNTER — Other Ambulatory Visit: Payer: Self-pay

## 2017-04-06 DIAGNOSIS — R6889 Other general symptoms and signs: Secondary | ICD-10-CM

## 2017-04-06 DIAGNOSIS — Z79899 Other long term (current) drug therapy: Secondary | ICD-10-CM | POA: Insufficient documentation

## 2017-04-06 DIAGNOSIS — J4521 Mild intermittent asthma with (acute) exacerbation: Secondary | ICD-10-CM | POA: Insufficient documentation

## 2017-04-06 DIAGNOSIS — J111 Influenza due to unidentified influenza virus with other respiratory manifestations: Secondary | ICD-10-CM | POA: Insufficient documentation

## 2017-04-06 DIAGNOSIS — F17210 Nicotine dependence, cigarettes, uncomplicated: Secondary | ICD-10-CM | POA: Insufficient documentation

## 2017-04-06 IMAGING — DX DG CHEST 2V
2 series · 2 of 2 positions shown · non-contrast
Comparison: [DATE]

CLINICAL DATA: Flu like symptoms

EXAM:
CHEST  2 VIEW

[chest pa]
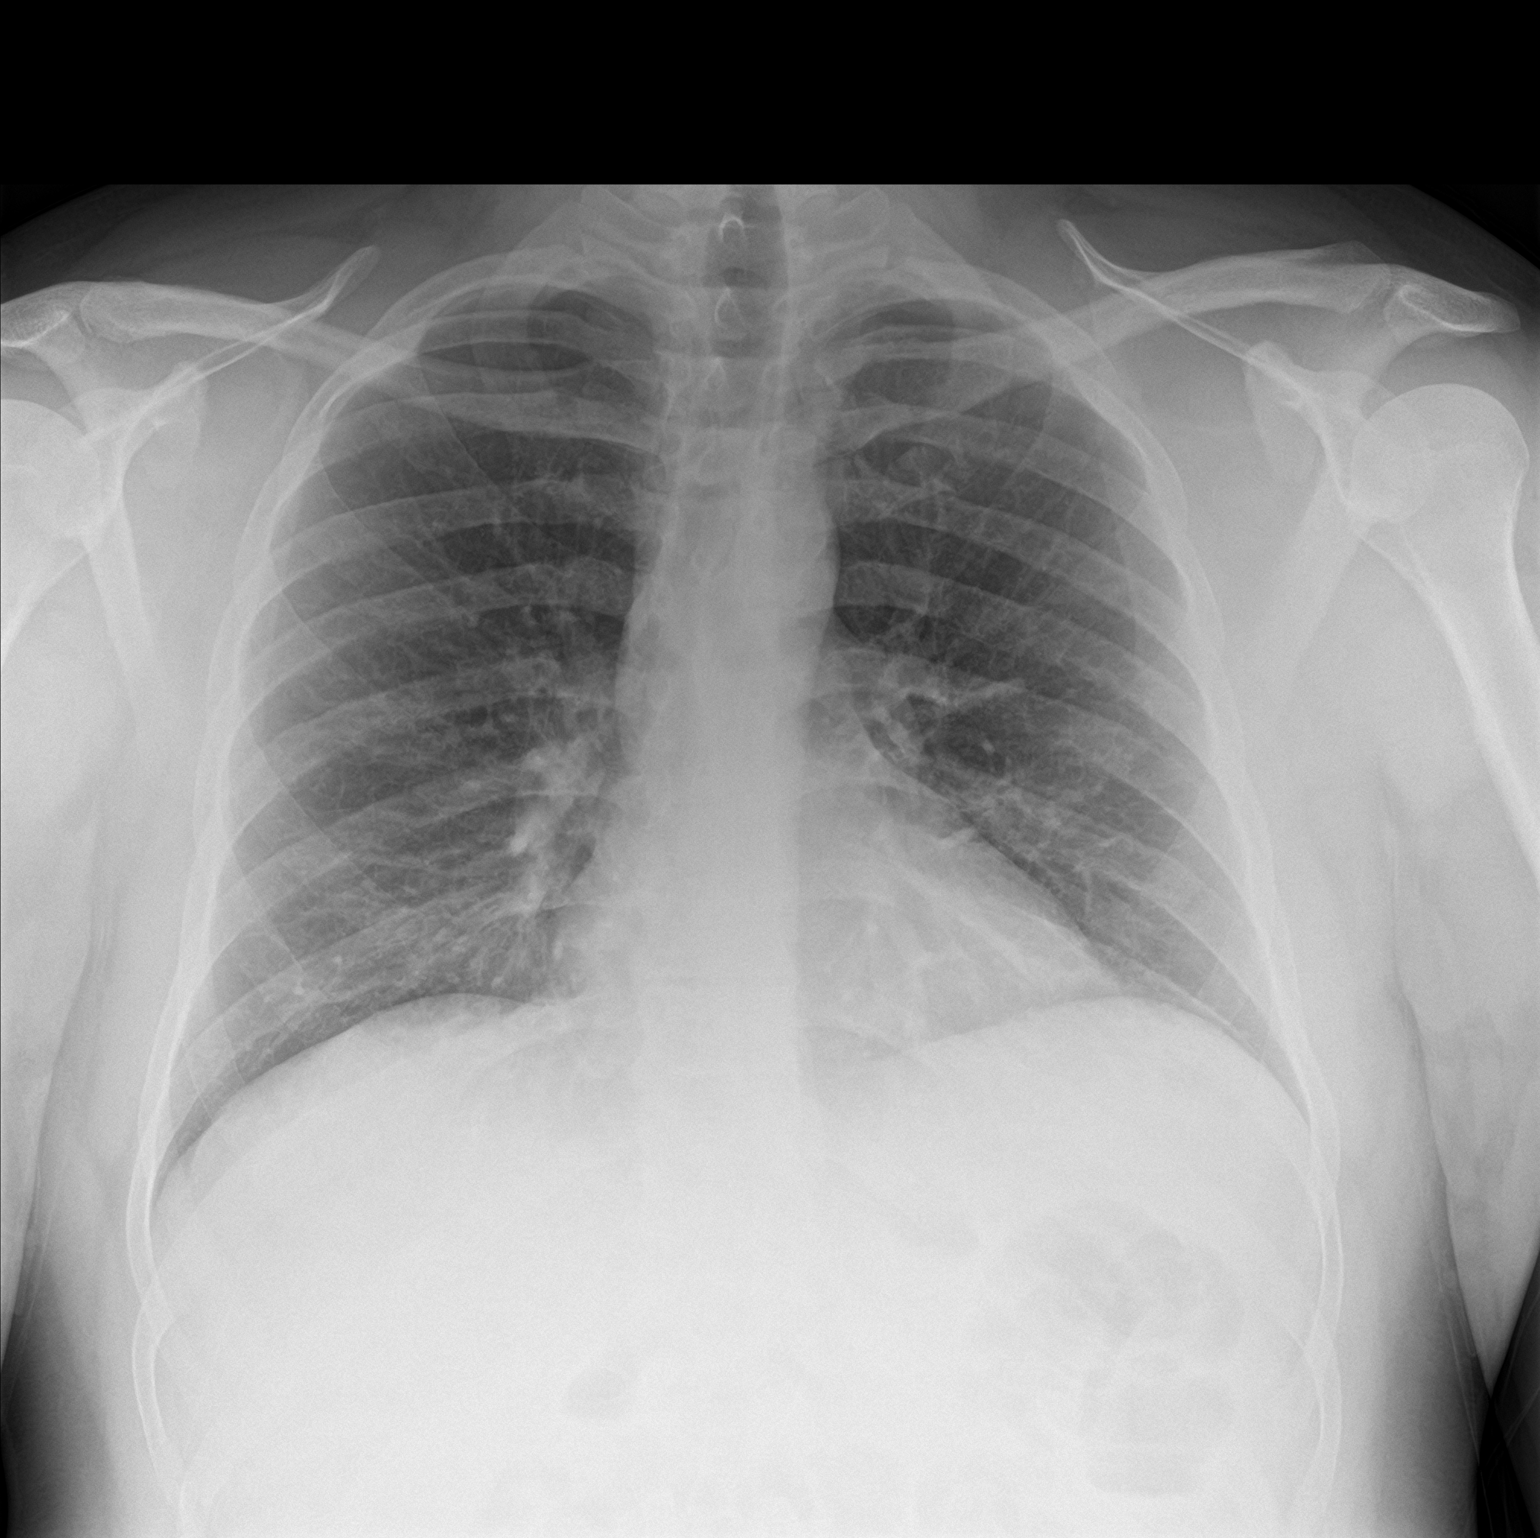

[chest lat]
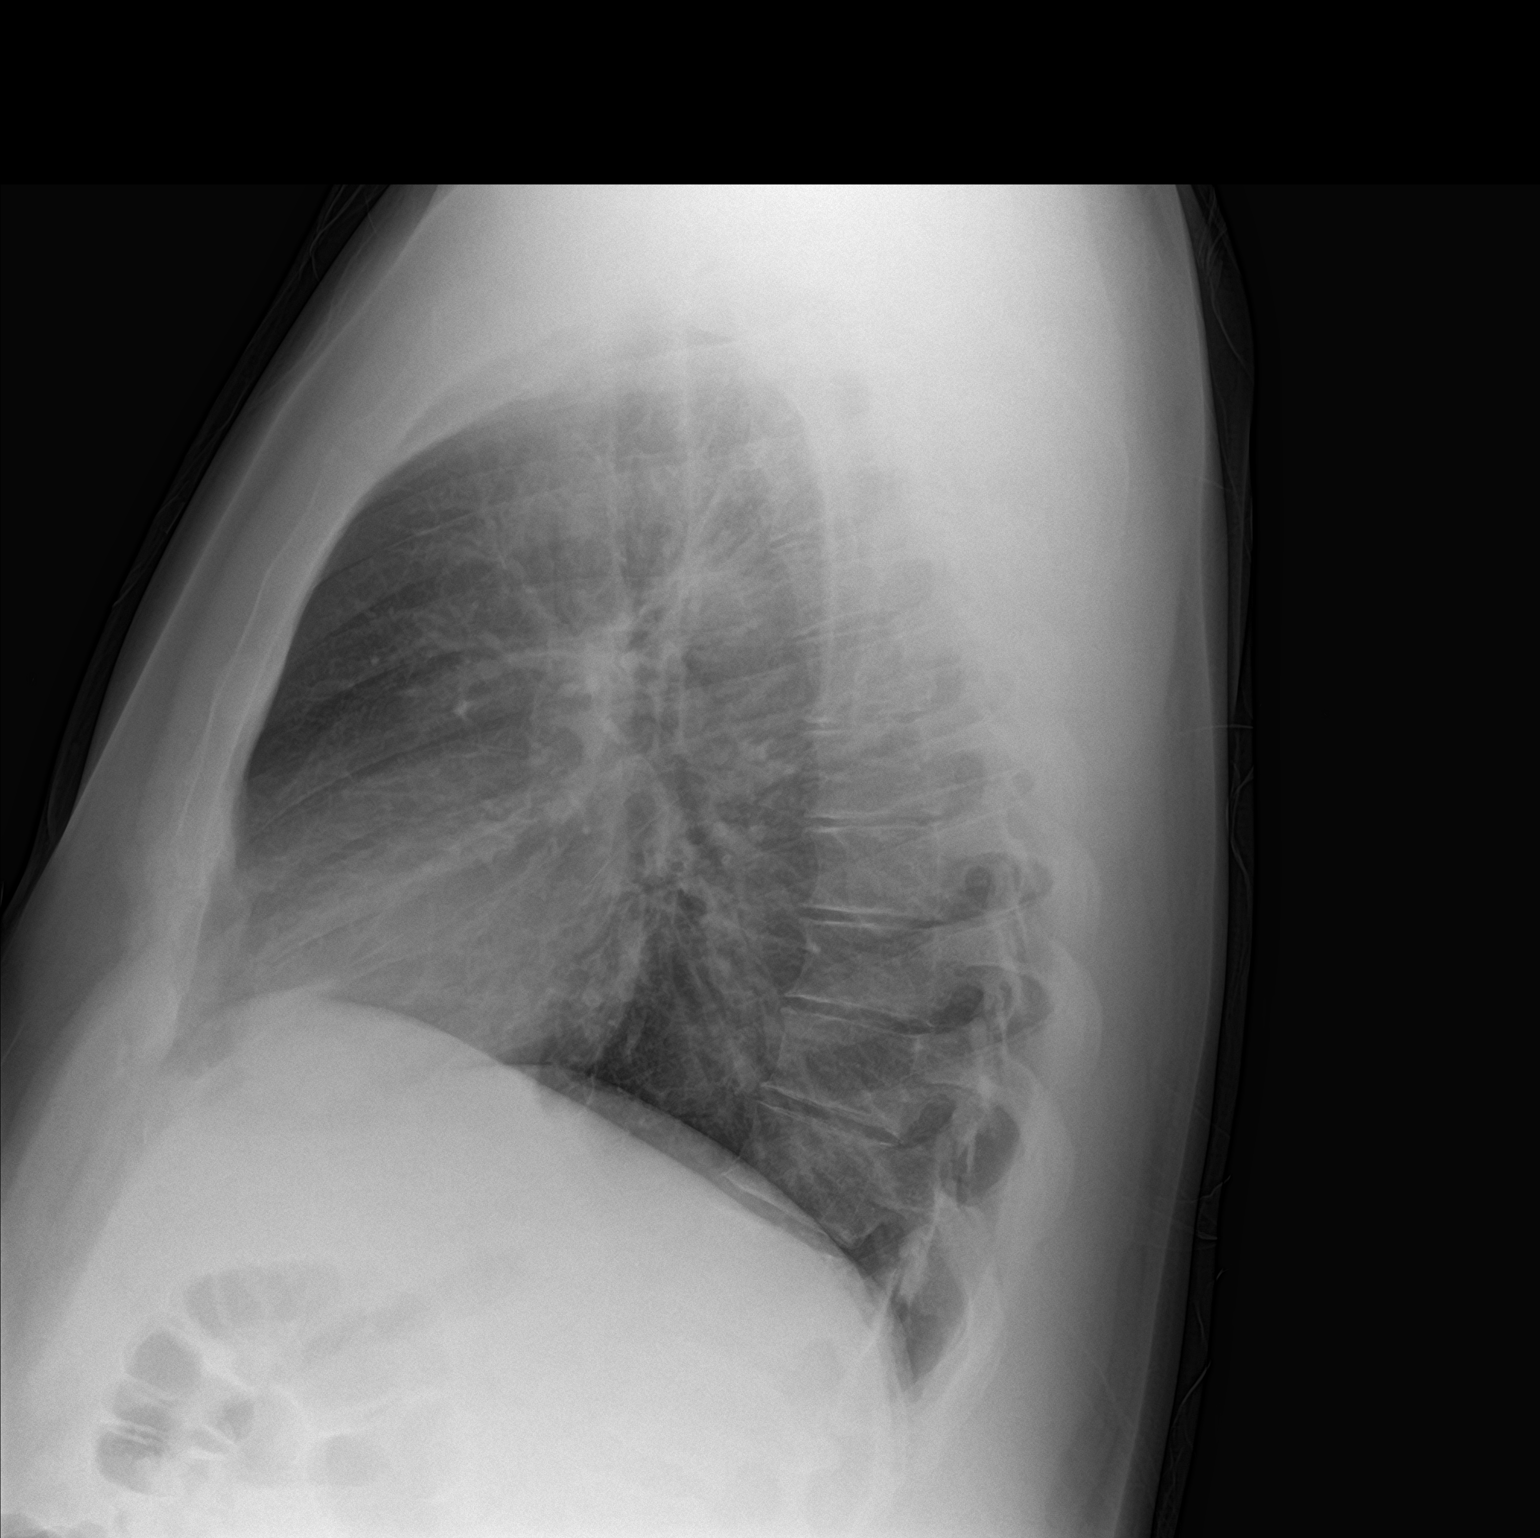

[2 of 2 positions shown; findings below may reference images not displayed]

FINDINGS: Normal heart size and mediastinal contours. No acute infiltrate or
edema. No effusion or pneumothorax. No acute osseous findings.
IMPRESSION: Negative chest

## 2017-04-06 MED ORDER — BENZONATATE 100 MG PO CAPS: 100.0000 mg | ORAL_CAPSULE | Freq: Three times a day (TID) | ORAL | 0 refills | Status: DC

## 2017-04-06 MED ORDER — IBUPROFEN 600 MG PO TABS: 600.0000 mg | ORAL_TABLET | Freq: Four times a day (QID) | ORAL | 0 refills | Status: DC | PRN

## 2017-04-06 MED ORDER — ALBUTEROL SULFATE (2.5 MG/3ML) 0.083% IN NEBU
INHALATION_SOLUTION | RESPIRATORY_TRACT | Status: AC
  Administered 2017-04-06: 2.5 mg via RESPIRATORY_TRACT
  Filled 2017-04-06: qty 3

## 2017-04-06 MED ORDER — ONDANSETRON HCL 4 MG PO TABS: 4.0000 mg | ORAL_TABLET | Freq: Three times a day (TID) | ORAL | 0 refills | Status: DC | PRN

## 2017-04-06 MED ORDER — METHYLPREDNISOLONE SODIUM SUCC 125 MG IJ SOLR
125.0000 mg | Freq: Once | INTRAMUSCULAR | Status: AC
  Administered 2017-04-06: 125 mg via INTRAVENOUS
  Filled 2017-04-06: qty 2

## 2017-04-06 MED ORDER — ALBUTEROL SULFATE HFA 108 (90 BASE) MCG/ACT IN AERS
1.0000 | INHALATION_SPRAY | RESPIRATORY_TRACT | Status: DC | PRN
  Administered 2017-04-06: 2 via RESPIRATORY_TRACT

## 2017-04-06 MED ORDER — PREDNISONE 10 MG PO TABS: 40.0000 mg | ORAL_TABLET | Freq: Every day | ORAL | 0 refills | Status: AC

## 2017-04-06 MED ORDER — IPRATROPIUM-ALBUTEROL 0.5-2.5 (3) MG/3ML IN SOLN
3.0000 mL | Freq: Once | RESPIRATORY_TRACT | Status: AC
  Administered 2017-04-06: 3 mL via RESPIRATORY_TRACT

## 2017-04-06 MED ORDER — KETOROLAC TROMETHAMINE 30 MG/ML IJ SOLN
30.0000 mg | Freq: Once | INTRAMUSCULAR | Status: AC
  Administered 2017-04-06: 30 mg via INTRAVENOUS
  Filled 2017-04-06 (×2): qty 1

## 2017-04-06 MED ORDER — SODIUM CHLORIDE 0.9 % IV BOLUS (SEPSIS)
1000.0000 mL | Freq: Once | INTRAVENOUS | Status: AC
  Administered 2017-04-06: 1000 mL via INTRAVENOUS

## 2017-04-06 MED ORDER — OSELTAMIVIR PHOSPHATE 75 MG PO CAPS: 75.0000 mg | ORAL_CAPSULE | Freq: Two times a day (BID) | ORAL | 0 refills | Status: DC

## 2017-04-06 MED ORDER — GUAIFENESIN ER 1200 MG PO TB12: 1.0000 | ORAL_TABLET | Freq: Two times a day (BID) | ORAL | 1 refills | Status: DC | PRN

## 2017-04-06 MED ORDER — IPRATROPIUM-ALBUTEROL 0.5-2.5 (3) MG/3ML IN SOLN
RESPIRATORY_TRACT | Status: AC
  Administered 2017-04-06: 3 mL via RESPIRATORY_TRACT
  Filled 2017-04-06: qty 3

## 2017-04-06 MED ORDER — ONDANSETRON HCL 4 MG/2ML IJ SOLN
4.0000 mg | Freq: Once | INTRAMUSCULAR | Status: AC
Start: 2017-04-06 — End: 2017-04-06
  Administered 2017-04-06: 4 mg via INTRAVENOUS
  Filled 2017-04-06: qty 2

## 2017-04-06 MED ORDER — ALBUTEROL SULFATE HFA 108 (90 BASE) MCG/ACT IN AERS
INHALATION_SPRAY | RESPIRATORY_TRACT | Status: AC
  Filled 2017-04-06: qty 6.7

## 2017-04-06 MED ORDER — ALBUTEROL SULFATE (2.5 MG/3ML) 0.083% IN NEBU
2.5000 mg | INHALATION_SOLUTION | Freq: Once | RESPIRATORY_TRACT | Status: AC
  Administered 2017-04-06: 2.5 mg via RESPIRATORY_TRACT

## 2017-04-06 NOTE — ED Triage Notes (Signed)
Cough, h/a, aching all over since yesterday around 1700

## 2017-04-06 NOTE — Discharge Instructions (Signed)
Your symptoms appear to be related to the flu. I suspect this caused excerebration of your asthma. I am prescribing you medication to treat your symptoms.  Zofran - take for nausea and vomiting Prednisone - take to help with with asthma.  Note that oral steroids can cause irritability, poor sleep, increased hunger and weight gain.  If you are diabetic this and also increase your blood sugars.  Please monitor at home. Tessalon - take as needed for cough Tamiflu - this decreases the duration of the flu. Can cause nausea and vomiting.  Mucinex - take for nasal congestion Ibuprofen - take for fever and body aches.  Albuterol inhaler - this medication will help open up your airway. Use inhaler as follows: 1-2 puffs with spacer every 4 hours as needed for wheezing, cough, or shortness of breath.   Follow attached instructions and handouts.   Follow up with PCP this week. Return for abdominal pain, persistent vomiting, inability to tolerate fluids, chest pain, or shortness of breath despite albuterol.

## 2017-04-06 NOTE — ED Provider Notes (Signed)
[place] [facility] EMERGENCY DEPARTMENT Provider Note   CSN: [id number] Arrival date & time: 04/06/17  0908     History   Chief Complaint Chief Complaint  Patient presents with  . Influenza    HPI Nicholas Brooks is a 32 y.o. male with a history of asthma who presents emergency department today for headache, subjective fevers, cough, congestion, body aches.  Patient reports that yesterday at approximately 5 PM he started feeling like he "caught the flu".  He notes that his sisters children have been sick with similar illnesses.  He notes that he started having body aches all over it that are worse in the lower back.  He reports generalized headache without neurologic symptoms.  He reports that he has developed nasal congestion, rhinorrhea and a dry, nonproductive cough.  He feels some chest tightness and shortness of breath similar to previous asthma exacerbations on arrival.  He was given a breathing treatment on arrival that has relieved the symptoms.  He reports yesterday night he had some abdominal cramping that was generalized followed by one episode of nonbilious, nonbloody emesis.  No abdominal pain nausea or emesis since the event.  The patient has not taken anything for symptoms prior to arrival.  He did attempt to be seen here yesterday but left due to the wait time.  The patient denies any visual changes, slurred speech, focal weakness, gait problems, neck stiffness, chest pain, hemoptysis, lower leg swelling, current abdominal pain, diarrhea or urinary symptoms.  No bowel/bladder incontinence.  HPI  Past Medical History:  Diagnosis Date  . Asthma   . 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 Vision abnormalities     Patient Active Problem List   Diagnosis Date Noted  . Psoriasis 06/19/2016  . Obesity (BMI 30.0-34.9) 06/19/2016  . 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 Family history of congenital or genetic condition 05/11/2015  . Smokes 1/2 pack/day  or less 02/19/2015  . Asthma 02/07/2015    Past Surgical History:  Procedure Laterality Date  . MANDIBLE SURGERY    . WISDOM TOOTH EXTRACTION         Home Medications    Prior to Admission medications   Medication Sig Start Date End Date Taking? Authorizing Provider  albuterol (PROVENTIL HFA) 108 (90 Base) MCG/ACT inhaler INHALE ONE TO TWO PUFFS INTO LUNGS EVERY 4 HOURS AS NEEDED FOR WHEEZING, SHORTNESS OF BREATH OR COUGH 09/29/16   [name], MD  albuterol (PROVENTIL) (2.5 MG/3ML) 0.083% nebulizer solution USE ONE VIAL IN NEBULIZER EVERY 4 HOURS AS NEEDED FOR WHEEZING 10/09/16   Quintella Reichert, MD  bacitracin-polymyxin b (POLYSPORIN) ophthalmic ointment Apply to left eye every 6 hours while awake for 5 days 12/11/16   McDonald, Mia A, PA-C  budesonide-formoterol (SYMBICORT) 160-4.5 MCG/ACT inhaler Inhale 2 puffs into the lungs 2 (two) times daily. 09/29/16   [name], MD  naproxen (NAPROSYN) 500 MG tablet Take 1 tablet (500 mg total) by mouth 2 (two) times daily. 11/22/16   Fredia Sorrow, MD  triamcinolone ointment (KENALOG) 0.5 % Apply 1 application topically 2 (two) times daily. 08/23/16   Eustaquio Maize, MD    Family History Family History  Problem Relation Age of Onset  . Cancer Maternal Grandmother        colon  . Asthma Sister   . Healthy Mother     Social History Social History   Tobacco Use  . Smoking status: Current Every Day Smoker    Packs/day: 0.50  Years: 8.00    Pack years: 4.00    Types: Cigarettes    Start date: 02/14/2003  . Smokeless tobacco: Never Used  . Tobacco comment: 1/4 -1/2 PPD (08/30/16)  Substance Use Topics  . Alcohol use: No    Alcohol/week: 0.0 oz  . Drug use: No     Allergies   Patient has no known allergies.   Review of Systems Review of Systems  All other systems reviewed and are negative.    Physical Exam Updated Vital Signs BP 139/90 (BP Location: Left Arm)   Pulse (!) 104   Temp 99.3 F (37.4 C) (Oral)    Resp 18   Ht _0  (1.727 m)   Wt 81.6 kg (180 lb)   SpO2 97%   BMI 27.37 kg/m   Physical Exam  Constitutional: He appears well-developed and well-nourished.  HENT:  Head: Normocephalic and atraumatic.  Right Ear: Tympanic membrane, external ear and ear canal normal.  Left Ear: Tympanic membrane, external ear and ear canal normal.  Nose: Mucosal edema and rhinorrhea present. Right sinus exhibits no maxillary sinus tenderness and no frontal sinus tenderness. Left sinus exhibits no maxillary sinus tenderness and no frontal sinus tenderness.  Mouth/Throat: Uvula is midline, oropharynx is clear and moist and mucous membranes are normal. No tonsillar exudate.  The patient has normal phonation and is in control of secretions. No stridor.  Midline uvula without edema. Soft palate rises symmetrically.  No tonsillar erythema or exudates. No PTA. Tongue protrusion is normal. No trismus. No creptius on neck palpation and patient has good dentition. No gingival erythema or fluctuance noted. Mucus membranes moist.   Eyes: Pupils are equal, round, and reactive to light. Right eye exhibits no discharge. Left eye exhibits no discharge. No scleral icterus.  Neck: Trachea normal. Neck supple. No JVD present. No spinous process tenderness present. Carotid bruit is not present. No neck rigidity. Normal range of motion present.  Cardiovascular: Normal rate, regular rhythm and intact distal pulses.  No murmur heard. Pulses:      Radial pulses are 2+ on the right side, and 2+ on the left side.       Dorsalis pedis pulses are 2+ on the right side, and 2+ on the left side.       Posterior tibial pulses are 2+ on the right side, and 2+ on the left side.  No lower extremity swelling or edema. Calves symmetric in size bilaterally.  Pulmonary/Chest: Effort normal and breath sounds normal. No accessory muscle usage. No tachypnea. No respiratory distress. He has no decreased breath sounds. He has no wheezes. He exhibits  no tenderness.  No increased work of breathing. No accessory muscle use. Patient is sitting upright, speaking in full sentences without difficulty   Abdominal: Soft. Bowel sounds are normal. There is no tenderness. There is no rebound and no guarding.  Musculoskeletal: He exhibits no edema.  Lymphadenopathy:    He has no cervical adenopathy.  Neurological: He is alert.  Skin: Skin is warm and dry. No rash noted. He is not diaphoretic.  Psychiatric: He has a normal mood and affect.  Nursing note and vitals reviewed.    ED Treatments / Results  Labs (all labs ordered are listed, but only abnormal results are displayed) Labs Reviewed - No data to display  EKG  EKG Interpretation None       Radiology No results found.  Procedures Procedures (including critical care time)  Medications Ordered in ED Medications  sodium chloride  0.9 % bolus 1,000 mL (not administered)  ondansetron (ZOFRAN) injection 4 mg (not administered)  methylPREDNISolone sodium succinate (SOLU-MEDROL) 125 mg/2 mL injection 125 mg (not administered)  ketorolac (TORADOL) 30 MG/ML injection 30 mg (not administered)  ipratropium-albuterol (DUONEB) 0.5-2.5 (3) MG/3ML nebulizer solution 3 mL (3 mLs Nebulization Given 04/06/17 0937)  albuterol (PROVENTIL) (2.5 MG/3ML) 0.083% nebulizer solution 2.5 mg (2.5 mg Nebulization Given 04/06/17 0937)     Initial Impression / Assessment and Plan / ED Course  I have reviewed the triage vital signs and the nursing notes.  Pertinent labs & imaging results that were available during my care of the patient were reviewed by me and considered in my medical decision making (see chart for details).     32 year old with history of asthma presenting with subjective fever, generalized HA without neurologic symptoms, nonproductive cough, congestion, body aches.  Sick contacts with the flu.  Did not receive flu vaccine this year.  He was noted to have chest tightness and mild shortness  of breath on arrival with expiratory wheezing noted.  Received DuoNeb treatment prior to my assessment with resolution of shortness of breath and wheezing.  Yesterday also had some abdominal cramping and one episode of emesis.  No abdominal pain or emesis since.  He has had decreased p.o. intake due to not feeling well.  On arrival the patient heart rate of 104.  Vital signs otherwise reassuring and without fever, apnea, hypoxia, hypotension or tachypnea.  On my assessment the patient is nontoxic appearing. Patient exam without evidence of AOM. No meningeal signs  Oropharynx is clear. No rash.  Lungs are clear to auscultation bilaterally.  No increased work of breathing.  Will obtain chest xray given chest tightness and shortness of breath on arrival. Denied chest pain.  Abdomen is soft, nondistended and without tenderness. Do not suspect intra-abdominal pathology. Patient without reported urinary symptoms that would make me believe UTI is what is the source of the patient's symptoms. Will given IVF, Zofran, and steroids.   Chest xray negative. After above treatment patient symptoms have improved. Tachycardia has resolved. Likely due to dehydration. Patient denies chest pain, chest tightness or shortness of breath.  Repeat lung exam without adventitious lung sounds. No increased work of breathing/. Repeat abdominal exam without tenderness palpation.  Patient has been without emesis in the department.  He is tolerating p.o. fluids.  Suspect patient has influenza-like illness that caused asthma exacerbation.  Will discharge home on Tamiflu given the patient's symptoms are less than 48 hours and he has history of asthma.  Will also discharged on symptomatic therapy for patient's symptoms and prednisone burst for asthma exacerbation.  Discussed reasons for the patient to return to the emergency department including but not limited to chest pain, shortness of breath despite albuterol treatment, hemoptysis, abdominal  pain, inability to tolerate p.o. fluids, persistent vomiting despite Zofran therapy or new/concerning symptoms.  Patient is to follow with her PCP this week.  Patient remains hemodynamically stable, tolerating p.o. fluids and appears safe for discharge.   Vitals:   04/06/17 0916 04/06/17 0918 04/06/17 0937 04/06/17 1111  BP: 139/90   125/74  Pulse: (!) 104   92  Resp: 18   18  Temp: 99.3 F (37.4 C)     TempSrc: Oral     SpO2: 96%  97% 98%  Weight:  81.6 kg (180 lb)    Height:  _0  (1.727 m)       Final Clinical Impressions(s) /  ED Diagnoses   Final diagnoses:  Flu-like symptoms  Mild intermittent asthma with acute exacerbation    ED Discharge Orders        Ordered    benzonatate (TESSALON) 100 MG capsule  Every 8 hours     04/06/17 1144    ondansetron (ZOFRAN) 4 MG tablet  Every 8 hours PRN     04/06/17 1144    Guaifenesin (MUCINEX MAXIMUM STRENGTH) 1200 MG TB12  Every 12 hours PRN     04/06/17 1144    predniSONE (DELTASONE) 10 MG tablet  Daily with breakfast     04/06/17 1144    oseltamivir (TAMIFLU) 75 MG capsule  Every 12 hours     04/06/17 1144    ibuprofen (ADVIL,MOTRIN) 600 MG tablet  Every 6 hours PRN     04/06/17 1144       Lorelle Gibbs 04/06/17 1336    Gareth Morgan, MD 04/06/17 2024

## 2017-04-06 NOTE — ED Notes (Signed)
Patient is resting comfortably. 

## 2017-05-02 ENCOUNTER — Encounter (HOSPITAL_BASED_OUTPATIENT_CLINIC_OR_DEPARTMENT_OTHER): Payer: Self-pay

## 2017-05-02 ENCOUNTER — Other Ambulatory Visit: Payer: Self-pay

## 2017-05-02 ENCOUNTER — Emergency Department (HOSPITAL_BASED_OUTPATIENT_CLINIC_OR_DEPARTMENT_OTHER)
Admission: EM | Admit: 2017-05-02 | Discharge: 2017-05-02 | Disposition: A | Discharge status: Home / Self Care | Payer: Self-pay | Attending: Emergency Medicine | Admitting: Emergency Medicine

## 2017-05-02 ENCOUNTER — Emergency Department (HOSPITAL_BASED_OUTPATIENT_CLINIC_OR_DEPARTMENT_OTHER): Payer: Self-pay

## 2017-05-02 DIAGNOSIS — Z113 Encounter for screening for infections with a predominantly sexual mode of transmission: Secondary | ICD-10-CM | POA: Insufficient documentation

## 2017-05-02 DIAGNOSIS — Z79899 Other long term (current) drug therapy: Secondary | ICD-10-CM | POA: Insufficient documentation

## 2017-05-02 DIAGNOSIS — F17210 Nicotine dependence, cigarettes, uncomplicated: Secondary | ICD-10-CM | POA: Insufficient documentation

## 2017-05-02 DIAGNOSIS — J4521 Mild intermittent asthma with (acute) exacerbation: Secondary | ICD-10-CM | POA: Insufficient documentation

## 2017-05-02 IMAGING — CR DG CHEST 2V
2 series · 2 of 2 positions shown · non-contrast
Comparison: Chest x-ray dated [DATE].

CLINICAL DATA: Shortness of breath and wheezing. History of asthma.

EXAM:
CHEST - 2 VIEW

[w chest pa]
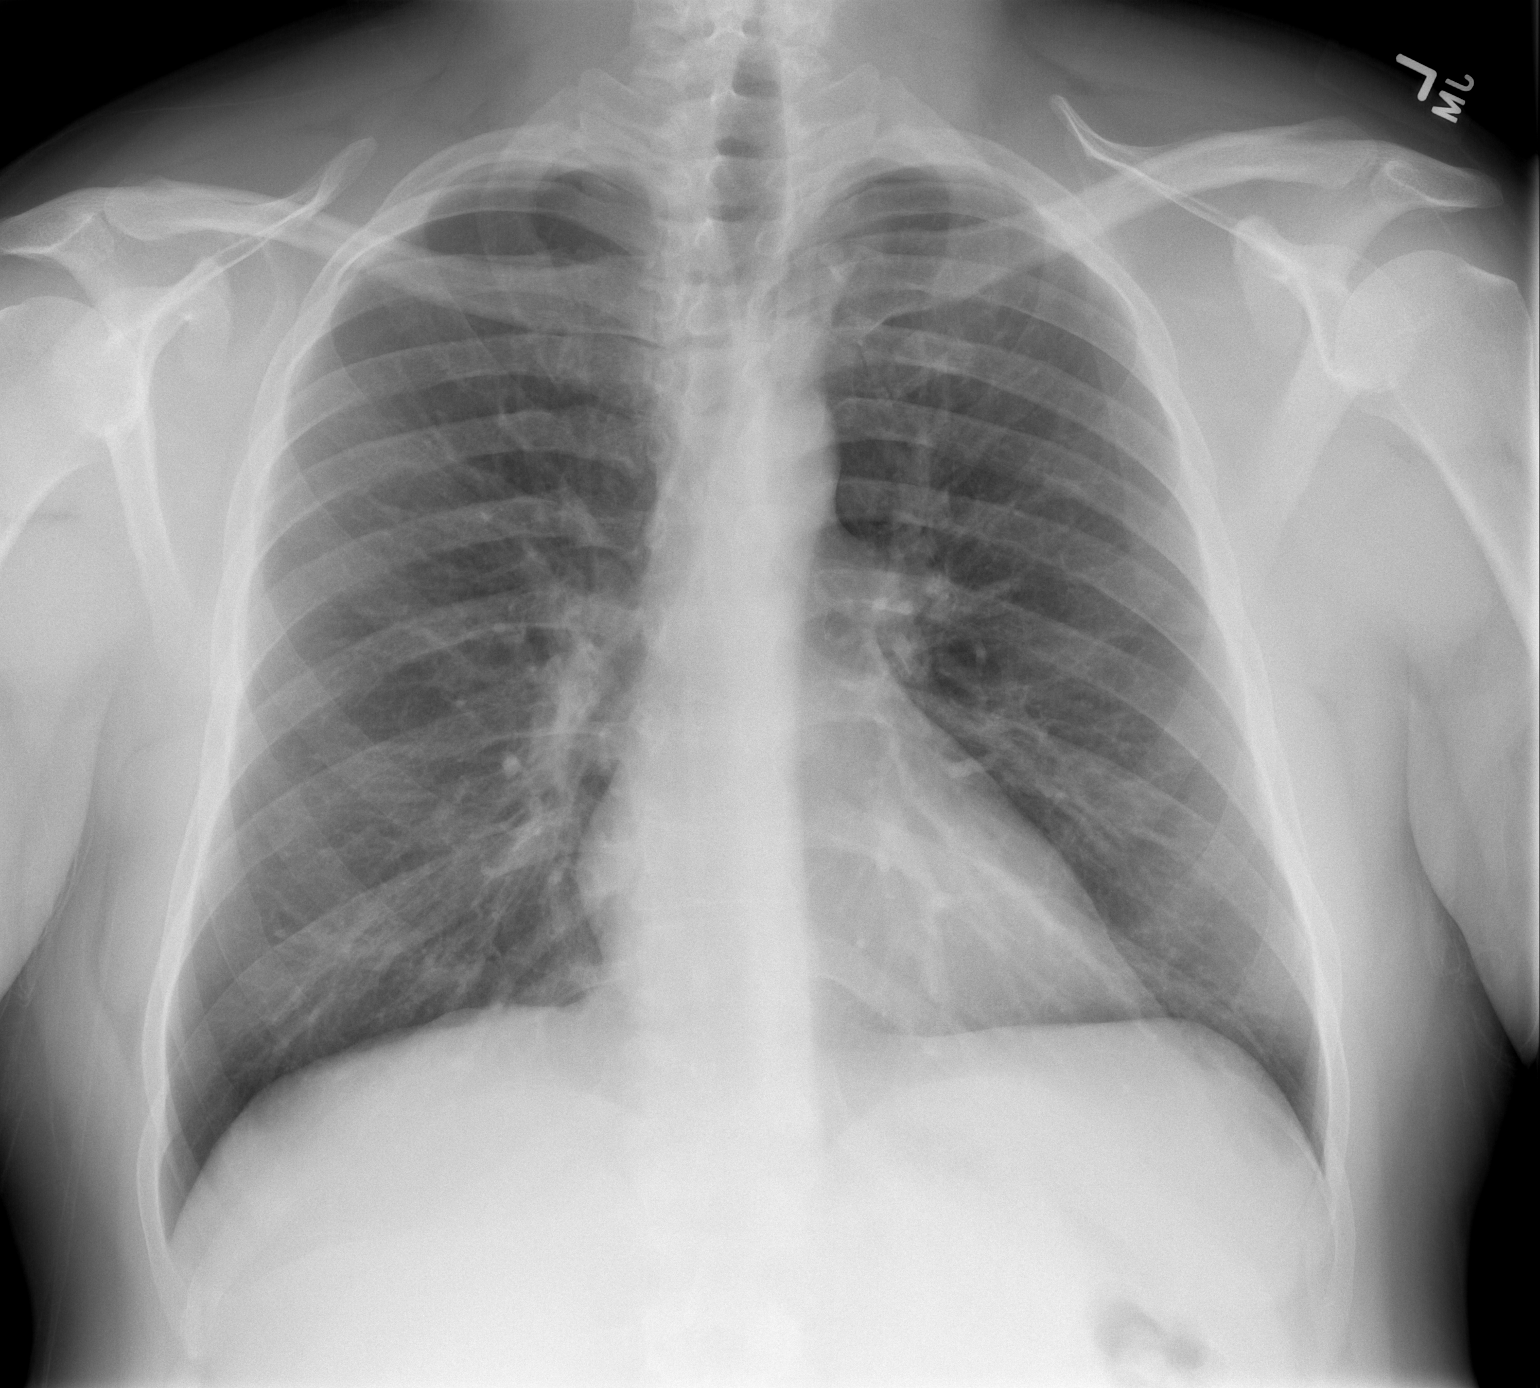

[w chest lat]
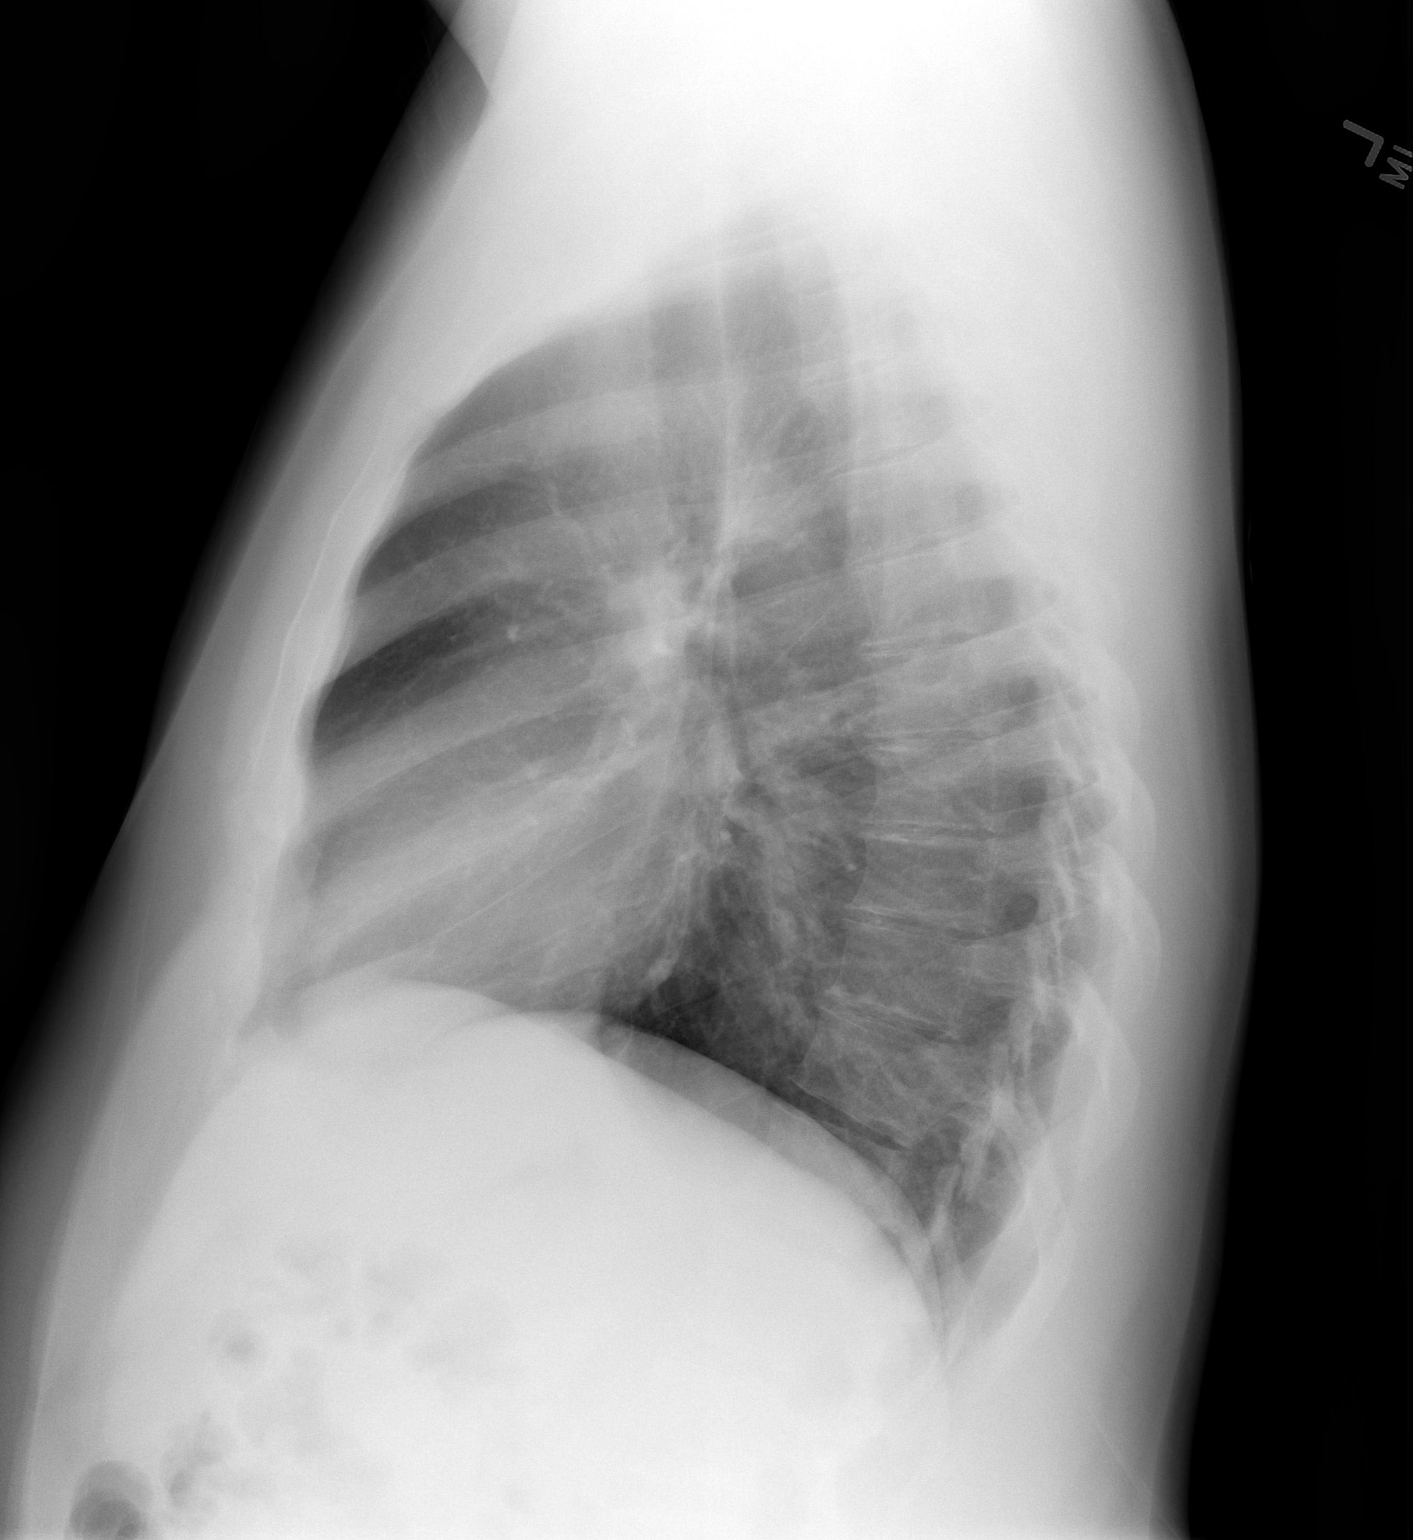

[2 of 2 positions shown; findings below may reference images not displayed]

FINDINGS: The heart size and mediastinal contours are within normal limits.
Both lungs are clear. The visualized skeletal structures are
unremarkable.
IMPRESSION: Normal chest x-ray.

## 2017-05-02 MED ORDER — PREDNISONE 20 MG PO TABS: 40.0000 mg | ORAL_TABLET | Freq: Every day | ORAL | 0 refills | Status: DC

## 2017-05-02 MED ORDER — PREDNISONE 10 MG PO TABS
60.0000 mg | ORAL_TABLET | Freq: Once | ORAL | Status: AC
  Administered 2017-05-02: 60 mg via ORAL
  Filled 2017-05-02: qty 1

## 2017-05-02 MED ORDER — IPRATROPIUM-ALBUTEROL 0.5-2.5 (3) MG/3ML IN SOLN
3.0000 mL | Freq: Once | RESPIRATORY_TRACT | Status: AC
  Administered 2017-05-02: 3 mL via RESPIRATORY_TRACT
  Filled 2017-05-02: qty 3

## 2017-05-02 MED ORDER — ALBUTEROL SULFATE HFA 108 (90 BASE) MCG/ACT IN AERS
1.0000 | INHALATION_SPRAY | Freq: Once | RESPIRATORY_TRACT | Status: AC
  Administered 2017-05-02: 1 via RESPIRATORY_TRACT
  Filled 2017-05-02: qty 6.7

## 2017-05-02 MED ORDER — BENZONATATE 100 MG PO CAPS: 100.0000 mg | ORAL_CAPSULE | Freq: Three times a day (TID) | ORAL | 0 refills | Status: DC

## 2017-05-02 NOTE — ED Provider Notes (Signed)
[facility] EMERGENCY DEPARTMENT Provider Note   CSN: [id number] Arrival date & time: 05/02/17  1157     History   Chief Complaint Chief Complaint  Patient presents with  . Shortness of Breath    HPI Nicholas Brooks is a 32 y.o. male.  HPI 32 year old Caucasian male past medical history significant for asthma presents to the emergency department today for evaluation of wheezing, chest tightness and cough.  Patient reports history of asthma and states that "my asthma is acting up".  The patient states that it started 4-5 days ago after moving furniture.  Patient states he has had to be admitted for asthma exacerbation in the past however has not had to be intubated.  Patient states that he ran out of his albuterol inhaler.  He is tried no other medications for his symptoms.  Denies any rhinorrhea, productive cough, fevers, chest pain.  Does report some mild shortness of breath due to his asthma.  Denies any lower extremity edema or calf tenderness.  Patient denies any history of DVT/PE, prolonged immobilization, recent hospitalization/surgeries, unilateral leg swelling or calf tenderness.  Denies any cardiac history.  Patient states that he wants to be checked for STDs.  Denies any symptoms including penile discharge, testicular pain or swelling, narrowing symptoms.  States he is sexually active with one male partner.  She does not use protection.  Patient states he just wants to be checked to make sure that everything is okay.  Pt denies any fever, chill, ha, vision changes, lightheadedness, dizziness, congestion, neck pain, cp,bd pain, n/v/d, urinary symptoms, change in bowel habits, melena, hematochezia, lower extremity paresthesias.   Past Medical History:  Diagnosis Date  . Asthma   . 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 Vision abnormalities     Patient Active Problem List   Diagnosis Date Noted  . Psoriasis 06/19/2016  . Obesity (BMI 30.0-34.9) 06/19/2016    . 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 Family history of congenital or genetic condition 05/11/2015  . Smokes 1/2 pack/day or less 02/19/2015  . Asthma 02/07/2015    Past Surgical History:  Procedure Laterality Date  . MANDIBLE SURGERY    . WISDOM TOOTH EXTRACTION         Home Medications    Prior to Admission medications   Medication Sig Start Date End Date Taking? Authorizing Provider  albuterol (PROVENTIL HFA) 108 (90 Base) MCG/ACT inhaler INHALE ONE TO TWO PUFFS INTO LUNGS EVERY 4 HOURS AS NEEDED FOR WHEEZING, SHORTNESS OF BREATH OR COUGH 09/29/16   [name], MD  albuterol (PROVENTIL) (2.5 MG/3ML) 0.083% nebulizer solution USE ONE VIAL IN NEBULIZER EVERY 4 HOURS AS NEEDED FOR WHEEZING 10/09/16   Quintella Reichert, MD  bacitracin-polymyxin b (POLYSPORIN) ophthalmic ointment Apply to left eye every 6 hours while awake for 5 days 12/11/16   McDonald, Mia A, PA-C  benzonatate (TESSALON) 100 MG capsule Take 1 capsule (100 mg total) by mouth every 8 (eight) hours. 05/02/17   Doristine Devoid, PA-C  budesonide-formoterol (SYMBICORT) 160-4.5 MCG/ACT inhaler Inhale 2 puffs into the lungs 2 (two) times daily. 09/29/16   [name], MD  Guaifenesin [facility] MAXIMUM STRENGTH) 1200 MG TB12 Take 1 tablet (1,200 mg total) by mouth every 12 (twelve) hours as needed. 04/06/17   Maczis, Barth Kirks, PA-C  ibuprofen (ADVIL,MOTRIN) 600 MG tablet Take 1 tablet (600 mg total) by mouth every 6 (six) hours as needed. 04/06/17   Maczis, Barth Kirks, PA-C  naproxen (NAPROSYN) 500 MG  tablet Take 1 tablet (500 mg total) by mouth 2 (two) times daily. 11/22/16   Fredia Sorrow, MD  ondansetron (ZOFRAN) 4 MG tablet Take 1 tablet (4 mg total) by mouth every 8 (eight) hours as needed for nausea or vomiting. 04/06/17   Maczis, Barth Kirks, PA-C  oseltamivir (TAMIFLU) 75 MG capsule Take 1 capsule (75 mg total) by mouth every 12 (twelve) hours. 04/06/17   Maczis, Barth Kirks, PA-C  predniSONE (DELTASONE)  20 MG tablet Take 2 tablets (40 mg total) by mouth daily with breakfast. 05/02/17   Ocie Cornfield T, PA-C  triamcinolone ointment (KENALOG) 0.5 % Apply 1 application topically 2 (two) times daily. 08/23/16   Eustaquio Maize, MD    Family History Family History  Problem Relation Age of Onset  . Cancer Maternal Grandmother        colon  . Asthma Sister   . Healthy Mother     Social History Social History   Tobacco Use  . Smoking status: Current Every Day Smoker    Packs/day: 0.50    Years: 8.00    Pack years: 4.00    Types: Cigarettes    Start date: 02/14/2003  . Smokeless tobacco: Never Used  . Tobacco comment: 1/4 -1/2 PPD (08/30/16)  Substance Use Topics  . Alcohol use: No    Alcohol/week: 0.0 oz  . Drug use: No     Allergies   Patient has no known allergies.   Review of Systems Review of Systems  All other systems reviewed and are negative.    Physical Exam Updated Vital Signs BP 138/83 (BP Location: Left Arm)   Pulse 92   Temp 98.3 F (36.8 C) (Oral)   Resp 18   Ht '5\' 8"'  (1.727 m)   Wt 98 kg (216 lb)   SpO2 99%   BMI 32.84 kg/m   Physical Exam  Constitutional: He is oriented to person, place, and time. He appears well-developed and well-nourished.  Non-toxic appearance. No distress.  HENT:  Head: Normocephalic and atraumatic.  Mouth/Throat: Oropharynx is clear and moist.  Eyes: Conjunctivae are normal. Pupils are equal, round, and reactive to light. Right eye exhibits no discharge. Left eye exhibits no discharge.  Neck: Normal range of motion. Neck supple.  Cardiovascular: Normal rate, regular rhythm, normal heart sounds and intact distal pulses. Exam reveals no gallop and no friction rub.  No murmur heard. Pulmonary/Chest: Effort normal. No accessory muscle usage or stridor. No tachypnea. No respiratory distress. He has decreased breath sounds. He has wheezes (expiraotyr). He has no rhonchi. He has no rales. He exhibits no tenderness.  Abdominal:  Soft. Bowel sounds are normal. He exhibits no distension. There is no tenderness. There is no rebound and no guarding.  Musculoskeletal: Normal range of motion. He exhibits no tenderness.  No lower extremity edema or calf tenderness.  Lymphadenopathy:    He has no cervical adenopathy.  Neurological: He is alert and oriented to person, place, and time.  Skin: Skin is warm and dry. Capillary refill takes less than 2 seconds. No rash noted.  Psychiatric: His behavior is normal. Judgment and thought content normal.  Nursing note and vitals reviewed.    ED Treatments / Results  Labs (all labs ordered are listed, but only abnormal results are displayed) Labs Reviewed  HIV ANTIBODY (ROUTINE TESTING)  RPR  GC/CHLAMYDIA PROBE AMP ([place]) NOT AT [facility]    EKG  EKG Interpretation None       Radiology Dg Chest 2  View  Result Date: 05/02/2017 CLINICAL DATA:  Shortness of breath and wheezing. History of asthma. EXAM: CHEST - 2 VIEW COMPARISON:  Chest x-ray dated April 06, 2017. FINDINGS: The heart size and mediastinal contours are within normal limits. Both lungs are clear. The visualized skeletal structures are unremarkable. IMPRESSION: Normal chest x-ray. Electronically Signed   By: Titus Dubin M.D.   On: 05/02/2017 12:39    Procedures Procedures (including critical care time)  Medications Ordered in ED Medications  ipratropium-albuterol (DUONEB) 0.5-2.5 (3) MG/3ML nebulizer solution 3 mL (3 mLs Nebulization Given 05/02/17 1340)  predniSONE (DELTASONE) tablet 60 mg (60 mg Oral Given 05/02/17 1300)  albuterol (PROVENTIL HFA;VENTOLIN HFA) 108 (90 Base) MCG/ACT inhaler 1 puff (1 puff Inhalation Given 05/02/17 1329)     Initial Impression / Assessment and Plan / ED Course  I have reviewed the triage vital signs and the nursing notes.  Pertinent labs & imaging results that were available during my care of the patient were reviewed by me and considered in my medical decision  making (see chart for details).     Patient ambulated in ED with O2 saturations maintained >90, no current signs of respiratory distress. Lung exam improved after nebulizer treatment.  X-ray shows no focal infiltrate as reviewed by myself.  Prednisone given in the ED and pt will bd dc with 5 day burst. Pt states they are breathing at baseline.  Medical presentation is not consistent with ACS, dissection, PE.  Patient has asked to be tested for STDs.  Have written GC chlamydia, HIV and syphilis.  These tests are pending at this time.  Patient has no symptoms.  We will not treat.  Declined exam given no symptoms.  No known contacts.  Pt is hemodynamically stable, in NAD, & able to ambulate in the ED. Evaluation does not show pathology that would require ongoing emergent intervention or inpatient treatment. I explained the diagnosis to the patient. Pain has been managed & has no complaints prior to dc. Pt is comfortable with above plan and is stable for discharge at this time. All questions were answered prior to disposition. Strict return precautions for f/u to the ED were discussed. Encouraged follow up with PCP.    Final Clinical Impressions(s) / ED Diagnoses   Final diagnoses:  Mild intermittent asthma with exacerbation  Screen for STD (sexually transmitted disease)    ED Discharge Orders        Ordered    predniSONE (DELTASONE) 20 MG tablet  Daily with breakfast     05/02/17 1332    benzonatate (TESSALON) 100 MG capsule  Every 8 hours     05/02/17 1332       Doristine Devoid, PA-C 05/02/17 1338    Fredia Sorrow, MD 05/04/17 1523

## 2017-05-02 NOTE — ED Triage Notes (Addendum)
Pt c/o SOB, wheezing- hx of asthma-last used albuterol 815am-NAD-steady gait-RT in triage to assess

## 2017-05-02 NOTE — Discharge Instructions (Signed)
Your chest x-ray was normal.  Your gonorrhea and chlamydia cultures along with HIV and syphilis tests are pending.  These will be notified if they are positive.  May follow up on line with your my chart.    You have been treated for an asthma attack in the ED. Take prednisone daily for 4 days starting tomorrow morning.   I have given you a refill of your home inhaler.   You may use the tessalon for cough.   Warm steam from a hot shower will help your symptoms.   Follow up with your primary care provider for further discussion of today's ER visit.   If you develop any new or worsening symptoms, including but not limited to fever, persistent vomiting, worsening shortness of breath or other symptoms that concern you, please return to the Emergency Department immediately.

## 2017-05-02 NOTE — ED Notes (Signed)
Pt later added he would like to be checked for STDs-denies sx-advised to speak with PCP due to added c/o after initial c/o of SOB/wheezing

## 2017-05-02 NOTE — ED Notes (Signed)
PT AMBULATED AROUND THE DEPARTMENT WITHOUT DIFFICULTY. OXYGEN SATURATION REMAINED AT 96%. RN [facility]([place]) NOTIFIED.

## 2017-05-03 LAB — GC/CHLAMYDIA PROBE AMP (~~LOC~~) NOT AT ARMC
CHLAMYDIA, DNA PROBE: NEGATIVE
Neisseria Gonorrhea: NEGATIVE

## 2017-05-03 LAB — HIV ANTIBODY (ROUTINE TESTING W REFLEX): HIV Screen 4th Generation wRfx: NONREACTIVE

## 2017-05-03 LAB — RPR: RPR Ser Ql: NONREACTIVE

## 2017-08-21 ENCOUNTER — Other Ambulatory Visit: Payer: Self-pay

## 2017-08-21 ENCOUNTER — Emergency Department (HOSPITAL_BASED_OUTPATIENT_CLINIC_OR_DEPARTMENT_OTHER)
Admission: EM | Admit: 2017-08-21 | Discharge: 2017-08-21 | Disposition: A | Discharge status: Home / Self Care | Payer: Self-pay | Attending: Emergency Medicine | Admitting: Emergency Medicine

## 2017-08-21 ENCOUNTER — Encounter (HOSPITAL_BASED_OUTPATIENT_CLINIC_OR_DEPARTMENT_OTHER): Payer: Self-pay | Admitting: Emergency Medicine

## 2017-08-21 DIAGNOSIS — F17210 Nicotine dependence, cigarettes, uncomplicated: Secondary | ICD-10-CM | POA: Insufficient documentation

## 2017-08-21 DIAGNOSIS — L03119 Cellulitis of unspecified part of limb: Secondary | ICD-10-CM

## 2017-08-21 DIAGNOSIS — L03115 Cellulitis of right lower limb: Secondary | ICD-10-CM | POA: Insufficient documentation

## 2017-08-21 DIAGNOSIS — Z79899 Other long term (current) drug therapy: Secondary | ICD-10-CM | POA: Insufficient documentation

## 2017-08-21 DIAGNOSIS — J45909 Unspecified asthma, uncomplicated: Secondary | ICD-10-CM | POA: Insufficient documentation

## 2017-08-21 MED ORDER — DOXYCYCLINE HYCLATE 100 MG PO CAPS: 100.0000 mg | ORAL_CAPSULE | Freq: Two times a day (BID) | ORAL | 0 refills | Status: DC

## 2017-08-21 MED ORDER — BACITRACIN ZINC 500 UNIT/GM EX OINT: 1.0000 "application " | TOPICAL_OINTMENT | Freq: Two times a day (BID) | CUTANEOUS | 0 refills | Status: DC

## 2017-08-21 NOTE — ED Triage Notes (Signed)
Reports rash to right lower leg.  States that this started last week.  Erythema noted to right lower leg.  No active drainage at present.

## 2017-08-21 NOTE — ED Provider Notes (Signed)
[facility] EMERGENCY DEPARTMENT Provider Note   CSN: [id number] Arrival date & time: 08/21/17  6578     History   Chief Complaint Chief Complaint  Patient presents with  . Rash    HPI Nicholas Brooks is a 32 y.o. male.  HPI Patient reports a wound to his right posterior distal calf over the past 8 to 9 days.  He states is becoming more painful and now is developing redness around the side and is concerned about possible infection.  It started out as a wound which he thinks may have occurred from a bug bite while the leg.  It was itching.  It no longer itches.  No fevers or chills.  No history of diabetes.  No other complaints at this time.  Symptoms are mild in severity.   Past Medical History:  Diagnosis Date  . Asthma   . 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 Vision abnormalities     Patient Active Problem List   Diagnosis Date Noted  . Psoriasis 06/19/2016  . Obesity (BMI 30.0-34.9) 06/19/2016  . 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 Family history of congenital or genetic condition 05/11/2015  . Smokes 1/2 pack/day or less 02/19/2015  . Asthma 02/07/2015    Past Surgical History:  Procedure Laterality Date  . MANDIBLE SURGERY    . WISDOM TOOTH EXTRACTION          Home Medications    Prior to Admission medications   Medication Sig Start Date End Date Taking? Authorizing Provider  albuterol (PROVENTIL HFA) 108 (90 Base) MCG/ACT inhaler INHALE ONE TO TWO PUFFS INTO LUNGS EVERY 4 HOURS AS NEEDED FOR WHEEZING, SHORTNESS OF BREATH OR COUGH 09/29/16   [name], MD  albuterol (PROVENTIL) (2.5 MG/3ML) 0.083% nebulizer solution USE ONE VIAL IN NEBULIZER EVERY 4 HOURS AS NEEDED FOR WHEEZING 10/09/16   Quintella Reichert, MD  bacitracin ointment Apply 1 application topically 2 (two) times daily. 08/21/17   Jola Schmidt, MD  bacitracin-polymyxin b (POLYSPORIN) ophthalmic ointment Apply to left eye every 6 hours while awake for 5 days  12/11/16   McDonald, Mia A, PA-C  benzonatate (TESSALON) 100 MG capsule Take 1 capsule (100 mg total) by mouth every 8 (eight) hours. 05/02/17   Doristine Devoid, PA-C  budesonide-formoterol (SYMBICORT) 160-4.5 MCG/ACT inhaler Inhale 2 puffs into the lungs 2 (two) times daily. 09/29/16   [name], MD  doxycycline (VIBRAMYCIN) 100 MG capsule Take 1 capsule (100 mg total) by mouth 2 (two) times daily. 08/21/17   Jola Schmidt, MD  Guaifenesin [facility] MAXIMUM STRENGTH) 1200 MG TB12 Take 1 tablet (1,200 mg total) by mouth every 12 (twelve) hours as needed. 04/06/17   Maczis, Barth Kirks, PA-C  ibuprofen (ADVIL,MOTRIN) 600 MG tablet Take 1 tablet (600 mg total) by mouth every 6 (six) hours as needed. 04/06/17   Maczis, Barth Kirks, PA-C  naproxen (NAPROSYN) 500 MG tablet Take 1 tablet (500 mg total) by mouth 2 (two) times daily. 11/22/16   Fredia Sorrow, MD  ondansetron (ZOFRAN) 4 MG tablet Take 1 tablet (4 mg total) by mouth every 8 (eight) hours as needed for nausea or vomiting. 04/06/17   Maczis, Barth Kirks, PA-C  oseltamivir (TAMIFLU) 75 MG capsule Take 1 capsule (75 mg total) by mouth every 12 (twelve) hours. 04/06/17   Maczis, Barth Kirks, PA-C  predniSONE (DELTASONE) 20 MG tablet Take 2 tablets (40 mg total) by mouth daily with breakfast. 05/02/17   Doristine Devoid, PA-C  triamcinolone ointment (KENALOG) 0.5 % Apply 1 application topically 2 (two) times daily. 08/23/16   Eustaquio Maize, MD    Family History Family History  Problem Relation Age of Onset  . Cancer Maternal Grandmother        colon  . Asthma Sister   . Healthy Mother     Social History Social History   Tobacco Use  . Smoking status: Current Every Day Smoker    Packs/day: 1.00    Years: 8.00    Pack years: 8.00    Types: Cigarettes    Start date: 02/14/2003  . Smokeless tobacco: Never Used  . Tobacco comment: 1/4 -1/2 PPD (08/30/16)  Substance Use Topics  . Alcohol use: No    Alcohol/week: 0.0 oz  . Drug use: No      Allergies   Patient has no known allergies.   Review of Systems Review of Systems  All other systems reviewed and are negative.    Physical Exam Updated Vital Signs BP 126/75 (BP Location: Right Arm)   Pulse 72   Temp 97.9 F (36.6 C) (Oral)   Resp 18   Ht '5\' 8"'  (1.727 m)   Wt 90.7 kg (200 lb)   SpO2 98%   BMI 30.41 kg/m   Physical Exam  Constitutional: He is oriented to person, place, and time. He appears well-developed and well-nourished.  HENT:  Head: Normocephalic.  Eyes: EOM are normal.  Neck: Normal range of motion.  Pulmonary/Chest: Effort normal.  Abdominal: He exhibits no distension.  Musculoskeletal: Normal range of motion.  Wound of the posterior right distal calf with signs of surrounding excoriation and mild surrounding warmth and erythema without fluctuance or drainage.  Partially scabbed wound.  Superficial and not a deep process  Neurological: He is alert and oriented to person, place, and time.  Psychiatric: He has a normal mood and affect.  Nursing note and vitals reviewed.    ED Treatments / Results  Labs (all labs ordered are listed, but only abnormal results are displayed) Labs Reviewed - No data to display  EKG None  Radiology No results found.  Procedures Procedures (including critical care time)  Medications Ordered in ED Medications - No data to display   Initial Impression / Assessment and Plan / ED Course  I have reviewed the triage vital signs and the nursing notes.  Pertinent labs & imaging results that were available during my care of the patient were reviewed by me and considered in my medical decision making (see chart for details).     Patient be treated for early cellulitis.  Home with topical antibiotics and oral antibiotics.  Instructed to return to the ER for new or worsening symptoms  At end of the interaction the patient requests asymptomatic STD screening.  He will be referred to the health department  for this  Final Clinical Impressions(s) / ED Diagnoses   Final diagnoses:  Cellulitis of lower leg    ED Discharge Orders        Ordered    doxycycline (VIBRAMYCIN) 100 MG capsule  2 times daily     08/21/17 0932    bacitracin ointment  2 times daily     08/21/17 0932       Jola Schmidt, MD 08/21/17 [phone]

## 2017-08-29 ENCOUNTER — Emergency Department (HOSPITAL_BASED_OUTPATIENT_CLINIC_OR_DEPARTMENT_OTHER)
Admission: EM | Admit: 2017-08-29 | Discharge: 2017-08-29 | Disposition: A | Discharge status: Home / Self Care | Payer: Self-pay | Attending: Emergency Medicine | Admitting: Emergency Medicine

## 2017-08-29 ENCOUNTER — Other Ambulatory Visit: Payer: Self-pay

## 2017-08-29 ENCOUNTER — Encounter (HOSPITAL_BASED_OUTPATIENT_CLINIC_OR_DEPARTMENT_OTHER): Payer: Self-pay | Admitting: Emergency Medicine

## 2017-08-29 DIAGNOSIS — F17210 Nicotine dependence, cigarettes, uncomplicated: Secondary | ICD-10-CM | POA: Insufficient documentation

## 2017-08-29 DIAGNOSIS — J4521 Mild intermittent asthma with (acute) exacerbation: Secondary | ICD-10-CM | POA: Insufficient documentation

## 2017-08-29 MED ORDER — ALBUTEROL SULFATE HFA 108 (90 BASE) MCG/ACT IN AERS: INHALATION_SPRAY | RESPIRATORY_TRACT | 5 refills | Status: AC

## 2017-08-29 MED ORDER — BUDESONIDE-FORMOTEROL FUMARATE 160-4.5 MCG/ACT IN AERO: 2.0000 | INHALATION_SPRAY | Freq: Two times a day (BID) | RESPIRATORY_TRACT | 5 refills | Status: DC

## 2017-08-29 MED ORDER — ALBUTEROL SULFATE (2.5 MG/3ML) 0.083% IN NEBU: INHALATION_SOLUTION | RESPIRATORY_TRACT | 0 refills | Status: DC

## 2017-08-29 MED ORDER — ALBUTEROL SULFATE HFA 108 (90 BASE) MCG/ACT IN AERS
2.0000 | INHALATION_SPRAY | Freq: Once | RESPIRATORY_TRACT | Status: AC
  Administered 2017-08-29: 2 via RESPIRATORY_TRACT
  Filled 2017-08-29: qty 6.7

## 2017-08-29 MED ORDER — ALBUTEROL SULFATE (2.5 MG/3ML) 0.083% IN NEBU
2.5000 mg | INHALATION_SOLUTION | Freq: Once | RESPIRATORY_TRACT | Status: AC
  Administered 2017-08-29: 2.5 mg via RESPIRATORY_TRACT
  Filled 2017-08-29: qty 3

## 2017-08-29 MED ORDER — IPRATROPIUM-ALBUTEROL 0.5-2.5 (3) MG/3ML IN SOLN
3.0000 mL | Freq: Once | RESPIRATORY_TRACT | Status: AC
  Administered 2017-08-29: 3 mL via RESPIRATORY_TRACT
  Filled 2017-08-29: qty 3

## 2017-08-29 NOTE — ED Provider Notes (Signed)
[place] EMERGENCY DEPARTMENT Provider Note   CSN: [id number] Arrival date & time: 08/29/17  9702     History   Chief Complaint Chief Complaint  Patient presents with  . Cough    HPI Nicholas Brooks is a 32 y.o. male.  HPI 32 year old male with a history of asthma reports dry nonproductive cough and mild shortness of breath over the past 48 to 72 hours.  No fevers or chills.  Cough is nonproductive.  States this feels like his asthma but is out of his asthma medications.  No chest pain or abdominal pain.  No back pain.  No other complaints.  Symptoms are mild to moderate in severity.   Past Medical History:  Diagnosis Date  . Asthma   . 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 Vision abnormalities     Patient Active Problem List   Diagnosis Date Noted  . Psoriasis 06/19/2016  . Obesity (BMI 30.0-34.9) 06/19/2016  . 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 Family history of congenital or genetic condition 05/11/2015  . Smokes 1/2 pack/day or less 02/19/2015  . Asthma 02/07/2015    Past Surgical History:  Procedure Laterality Date  . MANDIBLE SURGERY    . WISDOM TOOTH EXTRACTION          Home Medications    Prior to Admission medications   Medication Sig Start Date End Date Taking? Authorizing Provider  albuterol (PROVENTIL HFA) 108 (90 Base) MCG/ACT inhaler INHALE ONE TO TWO PUFFS INTO LUNGS EVERY 4 HOURS AS NEEDED FOR WHEEZING, SHORTNESS OF BREATH OR COUGH 08/29/17   Jola Schmidt, MD  albuterol (PROVENTIL) (2.5 MG/3ML) 0.083% nebulizer solution USE ONE VIAL IN NEBULIZER EVERY 4 HOURS AS NEEDED FOR WHEEZING 08/29/17   Jola Schmidt, MD  budesonide-formoterol [facility]) 160-4.5 MCG/ACT inhaler Inhale 2 puffs into the lungs 2 (two) times daily. 08/29/17   Jola Schmidt, MD    Family History Family History  Problem Relation Age of Onset  . Cancer Maternal Grandmother        colon  . Asthma Sister   . Healthy Mother     Social  History Social History   Tobacco Use  . Smoking status: Current Every Day Smoker    Packs/day: 1.00    Years: 8.00    Pack years: 8.00    Types: Cigarettes    Start date: 02/14/2003  . Smokeless tobacco: Never Used  . Tobacco comment: 1/4 -1/2 PPD (08/30/16)  Substance Use Topics  . Alcohol use: No    Alcohol/week: 0.0 oz  . Drug use: No     Allergies   Patient has no known allergies.   Review of Systems Review of Systems  All other systems reviewed and are negative.    Physical Exam Updated Vital Signs BP 135/86   Pulse 85   Temp 98.3 F (36.8 C)   Resp 18   Ht '5\' 8"'  (1.727 m)   Wt 90.7 kg (200 lb)   SpO2 95%   BMI 30.41 kg/m   Physical Exam  Constitutional: He is oriented to person, place, and time. He appears well-developed and well-nourished.  HENT:  Head: Normocephalic and atraumatic.  Eyes: EOM are normal.  Neck: Normal range of motion.  Cardiovascular: Normal rate and regular rhythm.  Pulmonary/Chest: Effort normal. No respiratory distress. He has wheezes.  Abdominal: Soft. He exhibits no distension. There is no tenderness.  Musculoskeletal: Normal range of motion.  Neurological: He is alert and oriented to person, place,  and time.  Skin: Skin is warm and dry.  Psychiatric: He has a normal mood and affect. Judgment normal.  Nursing note and vitals reviewed.    ED Treatments / Results  Labs (all labs ordered are listed, but only abnormal results are displayed) Labs Reviewed - No data to display  EKG None  Radiology No results found.  Procedures Procedures (including critical care time)  Medications Ordered in ED Medications  ipratropium-albuterol (DUONEB) 0.5-2.5 (3) MG/3ML nebulizer solution 3 mL (3 mLs Nebulization Given 08/29/17 0853)  albuterol (PROVENTIL) (2.5 MG/3ML) 0.083% nebulizer solution 2.5 mg (2.5 mg Nebulization Given 08/29/17 0853)  albuterol (PROVENTIL HFA;VENTOLIN HFA) 108 (90 Base) MCG/ACT inhaler 2 puff (2 puffs  Inhalation Given 08/29/17 0907)     Initial Impression / Assessment and Plan / ED Course  I have reviewed the triage vital signs and the nursing notes.  Pertinent labs & imaging results that were available during my care of the patient were reviewed by me and considered in my medical decision making (see chart for details).     Wheezing resolved in the emergency department.  Patient feels better.  Very mild asthma exacerbation.  I do not think he needs oral steroids.  He simply needs bronchodilators which she is out of.  Refills of his medications given.  Primary care follow-up.  Patient understands return to the emergency department for new or worsening symptoms  Final Clinical Impressions(s) / ED Diagnoses   Final diagnoses:  Mild intermittent asthma with exacerbation    ED Discharge Orders        Ordered    albuterol (PROVENTIL HFA) 108 (90 Base) MCG/ACT inhaler    Note to Pharmacy:  Please consider 90 day supplies to promote better adherence   08/29/17 0921    albuterol (PROVENTIL) (2.5 MG/3ML) 0.083% nebulizer solution    Note to Pharmacy:  J44.9   08/29/17 0921    budesonide-formoterol (SYMBICORT) 160-4.5 MCG/ACT inhaler  2 times daily     08/29/17 3015       Jola Schmidt, MD 08/29/17 [phone]

## 2017-08-29 NOTE — ED Triage Notes (Addendum)
Pt states hes had a cough since Monday, also ran out of his asthma medications.  No known fever, cough nonproductive.  Noted wheezing throughout.  No acute resp distress noted.  Pt needs refills.

## 2017-08-30 ENCOUNTER — Encounter (HOSPITAL_BASED_OUTPATIENT_CLINIC_OR_DEPARTMENT_OTHER): Payer: Self-pay | Admitting: Emergency Medicine

## 2017-08-30 ENCOUNTER — Emergency Department (HOSPITAL_BASED_OUTPATIENT_CLINIC_OR_DEPARTMENT_OTHER)
Admission: EM | Admit: 2017-08-30 | Discharge: 2017-08-30 | Disposition: A | Discharge status: Home / Self Care | Payer: Self-pay | Attending: Emergency Medicine | Admitting: Emergency Medicine

## 2017-08-30 ENCOUNTER — Other Ambulatory Visit: Payer: Self-pay

## 2017-08-30 DIAGNOSIS — F17210 Nicotine dependence, cigarettes, uncomplicated: Secondary | ICD-10-CM | POA: Insufficient documentation

## 2017-08-30 DIAGNOSIS — J45909 Unspecified asthma, uncomplicated: Secondary | ICD-10-CM | POA: Insufficient documentation

## 2017-08-30 DIAGNOSIS — R42 Dizziness and giddiness: Secondary | ICD-10-CM | POA: Insufficient documentation

## 2017-08-30 DIAGNOSIS — Z79899 Other long term (current) drug therapy: Secondary | ICD-10-CM | POA: Insufficient documentation

## 2017-08-30 MED ORDER — MECLIZINE HCL 25 MG PO TABS: 25.0000 mg | ORAL_TABLET | Freq: Three times a day (TID) | ORAL | 0 refills | Status: DC | PRN

## 2017-08-30 MED ORDER — MECLIZINE HCL 25 MG PO TABS
25.0000 mg | ORAL_TABLET | Freq: Once | ORAL | Status: AC
Start: 2017-08-30 — End: 2017-08-30
  Administered 2017-08-30: 25 mg via ORAL
  Filled 2017-08-30: qty 1

## 2017-08-30 NOTE — ED Triage Notes (Signed)
Pt states he woke up this morning "feeling weird", states the room is spinning. He went to bed at midnight feeling normal. He was able to drive himself here. Denies pain, ongoing cough for a week.

## 2017-08-30 NOTE — Discharge Instructions (Signed)
Please try to avoid the Delsym cough medicine as this may have caused you to feel swimmy headed.  You can also use the dizziness medication as needed, if you have worsening dizziness you need to come back to the emergency room. Try to drink extra water while ill.

## 2017-08-30 NOTE — ED Provider Notes (Signed)
[place] EMERGENCY DEPARTMENT Provider Note   CSN: [id number] Arrival date & time: 08/30/17  0756     History   Chief Complaint Chief Complaint  Patient presents with  . Dizziness    HPI Nicholas Brooks is a 32 y.o. male.  HPI  Patient presents today with dizziness, feeling like his head is swimming.  He states that he feels a little drunk but has not had any alcohol.  He says it was a little difficult for him to drive here due to this feeling.  He was seen yesterday here for a likely URI exacerbating his asthma and being out of his asthma inhalers.  He states he left the emergency room yesterday and went home and naps for about 6 hours.  Ate [employer] last night for dinner and drink Kool-Aid this morning.  Had one episode of emesis after coughing during the night.  Has tried over-the-counter cough medicine once prior to arrival and no other over-the-counter medications. he is never had this feeling of dizziness before. he also notes that voices sound distant to him.  Denies weakness or falls.  Past Medical History:  Diagnosis Date  . Asthma   . 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 Vision abnormalities     Patient Active Problem List   Diagnosis Date Noted  . Psoriasis 06/19/2016  . Obesity (BMI 30.0-34.9) 06/19/2016  . 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 Family history of congenital or genetic condition 05/11/2015  . Smokes 1/2 pack/day or less 02/19/2015  . Asthma 02/07/2015    Past Surgical History:  Procedure Laterality Date  . MANDIBLE SURGERY    . WISDOM TOOTH EXTRACTION          Home Medications    Prior to Admission medications   Medication Sig Start Date End Date Taking? Authorizing Provider  albuterol (PROVENTIL HFA) 108 (90 Base) MCG/ACT inhaler INHALE ONE TO TWO PUFFS INTO LUNGS EVERY 4 HOURS AS NEEDED FOR WHEEZING, SHORTNESS OF BREATH OR COUGH 08/29/17   Jola Schmidt, MD  albuterol (PROVENTIL) (2.5 MG/3ML) 0.083%  nebulizer solution USE ONE VIAL IN NEBULIZER EVERY 4 HOURS AS NEEDED FOR WHEEZING 08/29/17   Jola Schmidt, MD  budesonide-formoterol [facility]) 160-4.5 MCG/ACT inhaler Inhale 2 puffs into the lungs 2 (two) times daily. 08/29/17   Jola Schmidt, MD  meclizine (ANTIVERT) 25 MG tablet Take 1 tablet (25 mg total) by mouth 3 (three) times daily as needed for dizziness. 08/30/17   Sela Hilding, MD    Family History Family History  Problem Relation Age of Onset  . Cancer Maternal Grandmother        colon  . Asthma Sister   . Healthy Mother     Social History Social History   Tobacco Use  . Smoking status: Current Every Day Smoker    Packs/day: 1.00    Years: 8.00    Pack years: 8.00    Types: Cigarettes    Start date: 02/14/2003  . Smokeless tobacco: Never Used  . Tobacco comment: 1/4 -1/2 PPD (08/30/16)  Substance Use Topics  . Alcohol use: No    Alcohol/week: 0.0 oz  . Drug use: No     Allergies   Patient has no known allergies.   Review of Systems Review of Systems  Constitutional: Negative for activity change, fatigue and fever.  HENT: Positive for congestion and rhinorrhea. Negative for sore throat.   Respiratory: Positive for cough. Negative for shortness of breath and wheezing.   Cardiovascular:  Negative for chest pain.  Neurological: Positive for dizziness. Negative for syncope, speech difficulty, light-headedness and numbness.  All other systems reviewed and are negative.    Physical Exam Updated Vital Signs BP 133/89 (BP Location: Right Arm)   Pulse 65   Temp 98.3 F (36.8 C)   Resp 16   Ht _0  (1.727 m)   Wt 95.3 kg (210 lb)   SpO2 100%   BMI 31.93 kg/m   Physical Exam  Constitutional: He is oriented to person, place, and time. He appears well-developed and well-nourished. No distress.  HENT:  Head: Normocephalic and atraumatic.  Mouth/Throat: Oropharynx is clear and moist.  Significant bilateral cerumen impaction, unable to see TMs on  initial exam, clear on repeat exam. Crusted rhinorrhea around bilateral nares.   Eyes: Conjunctivae and EOM are normal.  Neck: Normal range of motion. Neck supple.  Cardiovascular: Normal rate, regular rhythm and normal heart sounds.  No murmur heard. Pulmonary/Chest: Effort normal.  Scattered wheeze over right middle lobe.  Does not clear with coughing.  Remainder of breath sounds normal.  Abdominal: Soft. Bowel sounds are normal.  Musculoskeletal: Normal range of motion. He exhibits no edema.  Neurological: He is alert and oriented to person, place, and time. No cranial nerve deficit or sensory deficit.  Dix Hallpike maneuver positive for exacerbating dizziness without nystagmus.  Skin: Skin is warm and dry. Capillary refill takes less than 2 seconds.  Psychiatric: He has a normal mood and affect.     ED Treatments / Results  Labs (all labs ordered are listed, but only abnormal results are displayed) Labs Reviewed - No data to display  EKG EKG Interpretation  Date/Time:  Thursday August 30 2017 08:07:48 EDT Ventricular Rate:  96 PR Interval:    QRS Duration: 84 QT Interval:  352 QTC Calculation: 445 R Axis:   52 Text Interpretation:  Sinus rhythm No significant change since last tracing Confirmed by Theotis Burrow [phone]) on 08/30/2017 8:12:51 AM Also confirmed by Theotis Burrow [phone]), editor Oswaldo Milian, Beverly ([id number])  on 08/30/2017 8:23:08 AM   Radiology No results found.  Procedures Procedures (including critical care time)  Medications Ordered in ED Medications  meclizine (ANTIVERT) tablet 25 mg (25 mg Oral Given 08/30/17 0910)     Initial Impression / Assessment and Plan / ED Course  I have reviewed the triage vital signs and the nursing notes.  Pertinent labs & imaging results that were available during my care of the patient were reviewed by me and considered in my medical decision making (see chart for details).     Exam was significant bilateral cerumen  impaction and crusted rhinorrhea around the nose.  Likely vertigo exacerbated by URI.  Orthostatic vitals obtained due to tachycardia on exam when standing.  Orthostatic vitals not suggestive of significant volume depletion.  Will encourage p.o. hydration and trial meclizine in the department.  Normal neuro exam, making intracranial process for dizziness much less likely.  Dizziness improved with meclizine, will discharge with this for as needed use.  Encourage patient to avoid Delsym in case this is exacerbating his wooziness, return if any symptoms are worsening. Final Clinical Impressions(s) / ED Diagnoses   Final diagnoses:  Vertigo    ED Discharge Orders        Ordered    meclizine (ANTIVERT) 25 MG tablet  3 times daily PRN     08/30/17 1017       Sela Hilding, MD 08/30/17 1207    Little, Apolonio Schneiders  Lilia Pro, MD 09/02/17 [phone]

## 2017-11-07 ENCOUNTER — Emergency Department (HOSPITAL_BASED_OUTPATIENT_CLINIC_OR_DEPARTMENT_OTHER)
Admission: EM | Admit: 2017-11-07 | Discharge: 2017-11-07 | Disposition: A | Discharge status: Home / Self Care | Payer: Self-pay | Attending: Emergency Medicine | Admitting: Emergency Medicine

## 2017-11-07 ENCOUNTER — Other Ambulatory Visit: Payer: Self-pay

## 2017-11-07 ENCOUNTER — Encounter (HOSPITAL_BASED_OUTPATIENT_CLINIC_OR_DEPARTMENT_OTHER): Payer: Self-pay | Admitting: Emergency Medicine

## 2017-11-07 DIAGNOSIS — L0201 Cutaneous abscess of face: Secondary | ICD-10-CM | POA: Insufficient documentation

## 2017-11-07 DIAGNOSIS — Z79899 Other long term (current) drug therapy: Secondary | ICD-10-CM | POA: Insufficient documentation

## 2017-11-07 DIAGNOSIS — F17210 Nicotine dependence, cigarettes, uncomplicated: Secondary | ICD-10-CM | POA: Insufficient documentation

## 2017-11-07 DIAGNOSIS — J45909 Unspecified asthma, uncomplicated: Secondary | ICD-10-CM | POA: Insufficient documentation

## 2017-11-07 MED ORDER — CLINDAMYCIN HCL 150 MG PO CAPS
300.0000 mg | ORAL_CAPSULE | Freq: Once | ORAL | Status: AC
  Administered 2017-11-07: 300 mg via ORAL
  Filled 2017-11-07: qty 2

## 2017-11-07 MED ORDER — ACETAMINOPHEN 325 MG PO TABS
650.0000 mg | ORAL_TABLET | Freq: Once | ORAL | Status: AC
Start: 2017-11-07 — End: 2017-11-07
  Administered 2017-11-07: 650 mg via ORAL
  Filled 2017-11-07: qty 2

## 2017-11-07 MED ORDER — LIDOCAINE-EPINEPHRINE 1 %-1:100000 IJ SOLN
INTRAMUSCULAR | Status: AC
  Filled 2017-11-07: qty 1

## 2017-11-07 MED ORDER — DOXYCYCLINE HYCLATE 100 MG PO CAPS: 100.0000 mg | ORAL_CAPSULE | Freq: Two times a day (BID) | ORAL | 0 refills | Status: DC

## 2017-11-07 MED ORDER — LIDOCAINE-EPINEPHRINE 2 %-1:100000 IJ SOLN
20.0000 mL | Freq: Once | INTRAMUSCULAR | Status: DC
Start: 2017-11-07 — End: 2017-11-07
  Filled 2017-11-07: qty 20

## 2017-11-07 MED FILL — DOXYCYCLINE HYCLATE 100 MG: 100 | 7 days supply | Qty: 14 | Fill #0

## 2017-11-07 NOTE — ED Triage Notes (Signed)
Reports under right eye swelling which began when he woke up this morning.  States that he noticed a spot on his face which he tried to drain yesterday and did have some purulent drainage but today woke up and the swelling had spread to under his right eye.  Denies visual deficiencies.

## 2017-11-11 NOTE — ED Provider Notes (Signed)
[facility] EMERGENCY DEPARTMENT Provider Note   CSN: [id number] Arrival date & time: 11/07/17  0849     History   Chief Complaint Chief Complaint  Patient presents with  . Facial Swelling    HPI Nicholas Brooks is a 32 y.o. male.  HPI Patient is a 32 year old male presents the emergency department with complaints of right periorbital swelling and possible abscess to the right lower lateral aspect of his right periorbital region.  No fevers or chills.  No significant itching.  He states that he tried to open it last night with a small amount of drainage.  Denies visual changes.  No pain with extraocular movements.  No fevers or chills.  No neck pain or difficulty breathing no significant headache.  No change in his vision.  Symptoms are mild in severity   Past Medical History:  Diagnosis Date  . Asthma   . 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 Vision abnormalities     Patient Active Problem List   Diagnosis Date Noted  . Psoriasis 06/19/2016  . Obesity (BMI 30.0-34.9) 06/19/2016  . 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 Family history of congenital or genetic condition 05/11/2015  . Smokes 1/2 pack/day or less 02/19/2015  . Asthma 02/07/2015    Past Surgical History:  Procedure Laterality Date  . MANDIBLE SURGERY    . WISDOM TOOTH EXTRACTION          Home Medications    Prior to Admission medications   Medication Sig Start Date End Date Taking? Authorizing Provider  albuterol (PROVENTIL HFA) 108 (90 Base) MCG/ACT inhaler INHALE ONE TO TWO PUFFS INTO LUNGS EVERY 4 HOURS AS NEEDED FOR WHEEZING, SHORTNESS OF BREATH OR COUGH 08/29/17   Jola Schmidt, MD  albuterol (PROVENTIL) (2.5 MG/3ML) 0.083% nebulizer solution USE ONE VIAL IN NEBULIZER EVERY 4 HOURS AS NEEDED FOR WHEEZING 08/29/17   Jola Schmidt, MD  budesonide-formoterol [facility]) 160-4.5 MCG/ACT inhaler Inhale 2 puffs into the lungs 2 (two) times daily. 08/29/17   Jola Schmidt,  MD  doxycycline (VIBRAMYCIN) 100 MG capsule Take 1 capsule (100 mg total) by mouth 2 (two) times daily. 11/07/17   Jola Schmidt, MD  meclizine (ANTIVERT) 25 MG tablet Take 1 tablet (25 mg total) by mouth 3 (three) times daily as needed for dizziness. 08/30/17   Sela Hilding, MD    Family History Family History  Problem Relation Age of Onset  . Cancer Maternal Grandmother        colon  . Asthma Sister   . Healthy Mother     Social History Social History   Tobacco Use  . Smoking status: Current Every Day Smoker    Packs/day: 1.00    Years: 8.00    Pack years: 8.00    Types: Cigarettes    Start date: 02/14/2003  . Smokeless tobacco: Never Used  . Tobacco comment: 1/4 -1/2 PPD (08/30/16)  Substance Use Topics  . Alcohol use: No    Alcohol/week: 0.0 standard drinks  . Drug use: No     Allergies   Patient has no known allergies.   Review of Systems Review of Systems  All other systems reviewed and are negative.    Physical Exam Updated Vital Signs BP 131/79   Pulse 67   Temp 98.3 F (36.8 C) (Oral)   Resp 16   Ht _0  (1.727 m)   Wt 90.7 kg   SpO2 99%   BMI 30.41 kg/m   Physical Exam  Constitutional: He is oriented to person, place, and time. He appears well-developed and well-nourished.  HENT:  Head: Atraumatic.  Mild periorbital edema of the right eye mainly in the inferior aspect.  Inferior lateral periorbital region on the right demonstrates a small abscess with fluctuance and surrounding induration.  No purulent drainage at this time.  Eyes: EOM are normal.  Neck: Normal range of motion.  Cardiovascular: Normal rate.  Pulmonary/Chest: Effort normal.  Abdominal: He exhibits no distension.  Musculoskeletal: Normal range of motion.  Neurological: He is alert and oriented to person, place, and time.  Psychiatric: He has a normal mood and affect.  Nursing note and vitals reviewed.    ED Treatments / Results  Labs (all labs ordered are listed,  but only abnormal results are displayed) Labs Reviewed - No data to display  EKG None  Radiology No results found.  Procedures .Marland KitchenIncision and Drainage Performed by: Jola Schmidt, MD Authorized by: Jola Schmidt, MD     INCISION AND DRAINAGE Performed by: Jola Schmidt Consent: Verbal consent obtained. Risks and benefits: risks, benefits and alternatives were discussed Time out performed prior to procedure Type: abscess Body area: Right periorbital region Anesthesia: local infiltration Incision was made with a scalpel. Local anesthetic: lidocaine 2 % with epinephrine Anesthetic total: 3 ml Complexity: complex Blunt dissection to break up loculations Drainage: purulent Drainage amount: Small Packing material: None Patient tolerance: Patient tolerated the procedure well with no immediate complications.     Medications Ordered in ED Medications  clindamycin (CLEOCIN) capsule 300 mg (300 mg Oral Given 11/07/17 0928)  acetaminophen (TYLENOL) tablet 650 mg (650 mg Oral Given 11/07/17 5102)     Initial Impression / Assessment and Plan / ED Course  I have reviewed the triage vital signs and the nursing notes.  Pertinent labs & imaging results that were available during my care of the patient were reviewed by me and considered in my medical decision making (see chart for details).     Incision and drainage performed.  Tolerated the procedure well.  Small amount of purulent material.  Home with antibiotics.  Final Clinical Impressions(s) / ED Diagnoses   Final diagnoses:  Facial abscess    ED Discharge Orders         Ordered    doxycycline (VIBRAMYCIN) 100 MG capsule  2 times daily     11/07/17 1051           Jola Schmidt, MD 11/11/17 [phone]

## 2018-08-15 ENCOUNTER — Emergency Department (HOSPITAL_BASED_OUTPATIENT_CLINIC_OR_DEPARTMENT_OTHER)
Admission: EM | Admit: 2018-08-15 | Discharge: 2018-08-15 | Disposition: A | Discharge status: Home / Self Care | Payer: Self-pay | Attending: Emergency Medicine | Admitting: Emergency Medicine

## 2018-08-15 ENCOUNTER — Other Ambulatory Visit: Payer: Self-pay

## 2018-08-15 ENCOUNTER — Encounter (HOSPITAL_BASED_OUTPATIENT_CLINIC_OR_DEPARTMENT_OTHER): Payer: Self-pay

## 2018-08-15 DIAGNOSIS — B079 Viral wart, unspecified: Secondary | ICD-10-CM | POA: Insufficient documentation

## 2018-08-15 DIAGNOSIS — F17210 Nicotine dependence, cigarettes, uncomplicated: Secondary | ICD-10-CM | POA: Insufficient documentation

## 2018-08-15 DIAGNOSIS — Q925 Duplications with other complex rearrangements: Secondary | ICD-10-CM | POA: Insufficient documentation

## 2018-08-15 NOTE — ED Provider Notes (Signed)
[facility] EMERGENC[facility] DEPARTMENT Provider Note   CSN: [id number] Arrival date & time: 08/15/18  2113    History   Chief Complaint Chief Complaint  Patient presents with  . Verrucous Vulgaris    HPI Nicholas Brooks is a 33 y.o. male who presents for evaluation of wart to his left heel that is been ongoing for several months.  Patient states that since onset of symptoms, it has gotten bigger.  Denies any pain associated with it.  He has not had any overlying warmth, erythema.  He denies any fevers.  He states that he came in today because he was tired of not knowing what it is.  Denies any acute changes that brought him to the emergency department.  Patient denies any history of diabetes.     The history is provided by the patient.    Past Medical History:  Diagnosis Date  . Asthma   . Chromosome 7G01.7 duplication syndrome   . Vision abnormalities     Patient Active Problem List   Diagnosis Date Noted  . Psoriasis 06/19/2016  . Obesity (BMI 30.0-34.9) 06/19/2016  . 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 Family history of congenital or genetic condition 05/11/2015  . Smokes 1/2 pack/day or less 02/19/2015  . Asthma 02/07/2015    Past Surgical History:  Procedure Laterality Date  . MANDIBLE SURGERY    . WISDOM TOOTH EXTRACTION          Home Medications    Prior to Admission medications   Medication Sig Start Date End Date Taking? Authorizing Provider  albuterol (PROVENTIL HFA) 108 (90 Base) MCG/ACT inhaler INHALE ONE TO TWO PUFFS INTO LUNGS EVERY 4 HOURS AS NEEDED FOR WHEEZING, SHORTNESS OF BREATH OR COUGH 08/29/17   Jola Schmidt, MD  albuterol (PROVENTIL) (2.5 MG/3ML) 0.083% nebulizer solution USE ONE VIAL IN NEBULIZER EVERY 4 HOURS AS NEEDED FOR WHEEZING 08/29/17   Jola Schmidt, MD  budesonide-formoterol [facility]) 160-4.5 MCG/ACT inhaler Inhale 2 puffs into the lungs 2 (two) times daily. 08/29/17   Jola Schmidt, MD  doxycycline  (VIBRAMYCIN) 100 MG capsule Take 1 capsule (100 mg total) by mouth 2 (two) times daily. 11/07/17   Jola Schmidt, MD  meclizine (ANTIVERT) 25 MG tablet Take 1 tablet (25 mg total) by mouth 3 (three) times daily as needed for dizziness. 08/30/17   Sela Hilding, MD    Family History Family History  Problem Relation Age of Onset  . Cancer Maternal Grandmother        colon  . Asthma Sister   . Healthy Mother     Social History Social History   Tobacco Use  . Smoking status: Current Every Day Smoker    Packs/day: 1.00    Years: 8.00    Pack years: 8.00    Types: Cigarettes    Start date: 02/14/2003  . Smokeless tobacco: Never Used  . Tobacco comment: 1/4 -1/2 PPD (08/30/16)  Substance Use Topics  . Alcohol use: No    Alcohol/week: 0.0 standard drinks  . Drug use: No     Allergies   Patient has no known allergies.   Review of Systems Review of Systems  Constitutional: Negative for fever.  Skin: Positive for wound. Negative for color change.     Physical Exam Updated Vital Signs BP (!) 129/93 (BP Location: Left Arm)   Pulse 87   Temp 98.4 F (36.9 C) (Oral)   Resp 18   Ht _0  (1.727 m)   Wt  104.3 kg   SpO2 97%   BMI 34.97 kg/m   Physical Exam Vitals signs and nursing note reviewed.  Constitutional:      Appearance: He is well-developed.  HENT:     Head: Normocephalic and atraumatic.  Eyes:     General: No scleral icterus.       Right eye: No discharge.        Left eye: No discharge.     Conjunctiva/sclera: Conjunctivae normal.  Pulmonary:     Effort: Pulmonary effort is normal.  Skin:    General: Skin is warm and dry.     Capillary Refill: Capillary refill takes less than 2 seconds.       Neurological:     Mental Status: He is alert.  Psychiatric:        Speech: Speech normal.        Behavior: Behavior normal.      ED Treatments / Results  Labs (all labs ordered are listed, but only abnormal results are displayed) Labs Reviewed - No  data to display  EKG None  Radiology No results found.  Procedures Procedures (including critical care time)  Medications Ordered in ED Medications - No data to display   Initial Impression / Assessment and Plan / ED Course  I have reviewed the triage vital signs and the nursing notes.  Pertinent labs & imaging results that were available during my care of the patient were reviewed by me and considered in my medical decision making (see chart for details).        33 year old male who presents for evaluation of growth to left foot times a few months.  Came in today because he wanted to know what it is.  No fevers, surrounding warmth, erythema.  No history of diabetes. Patient is afebrile, non-toxic appearing, sitting comfortably on examination table. Vital signs reviewed and stable.  On exam, he has a small approximately 1 cm wartlike growth that is scaly and dry to the posterior aspect of his left heel.  He also has some additional scaly wartlike areas noted to the plantar surface of his left foot.  But that this is most likely cutaneous wart vs skin tag. History/physical exam does not look concerning for  abscess, infectious etiology. The area is not discolored and does look suspicious for melanoma.  I discussed with patient that he will most likely need evaluation by podiatry for permanent treatment options.  We will plan to give him outpatient referral. At this time, patient exhibits no emergent life-threatening condition that require further evaluation in ED or admission. Patient had ample opportunity for questions and discussion. All patient's questions were answered with full understanding. Strict return precautions discussed. Patient expresses understanding and agreement to plan.  Portions of this note were generated with [employer]. Dictation errors may occur despite best attempts at proofreading.  Final Clinical Impressions(s) / ED Diagnoses   Final diagnoses:   Viral warts, unspecified type    ED Discharge Orders    None       Desma Mcgregor 08/15/18 2153    Sherwood Gambler, MD 08/19/18 1352

## 2018-08-15 NOTE — ED Triage Notes (Signed)
C/o wart like area to left heel x "few months"-NAD-steady gait

## 2018-08-15 NOTE — Discharge Instructions (Signed)
As we discussed, you will need to follow-up with referred podiatrist.  Call their office and arrange for an appointment.  Return the emergency department for any worsening pain, surrounding redness or warmth, fevers or any other changes or discoloration in the area.

## 2019-03-21 ENCOUNTER — Ambulatory Visit: Payer: Medicaid Other | Attending: Internal Medicine

## 2019-03-21 DIAGNOSIS — Z20822 Contact with and (suspected) exposure to covid-19: Secondary | ICD-10-CM

## 2019-03-23 LAB — NOVEL CORONAVIRUS, NAA: SARS-CoV-2, NAA: NOT DETECTED

## 2021-02-22 ENCOUNTER — Other Ambulatory Visit: Payer: Self-pay

## 2021-02-22 ENCOUNTER — Ambulatory Visit: Payer: 59 | Admitting: Nurse Practitioner

## 2021-02-22 ENCOUNTER — Encounter: Payer: Self-pay | Admitting: Nurse Practitioner

## 2021-02-22 VITALS — BP 131/87 | HR 87 | Temp 98.1°F | Ht 68.0 in | Wt 238.4 lb

## 2021-02-22 DIAGNOSIS — Z7689 Persons encountering health services in other specified circumstances: Secondary | ICD-10-CM

## 2021-02-22 DIAGNOSIS — R0989 Other specified symptoms and signs involving the circulatory and respiratory systems: Secondary | ICD-10-CM | POA: Diagnosis not present

## 2021-02-22 MED ORDER — PSEUDOEPH-BROMPHEN-DM 30-2-10 MG/5ML PO SYRP: 5.0000 mL | ORAL_SOLUTION | Freq: Four times a day (QID) | ORAL | 0 refills | Status: DC | PRN

## 2021-02-22 NOTE — Assessment & Plan Note (Signed)
Take meds as prescribed - Use a cool mist humidifier  -Use saline nose sprays frequently -Force fluids -For fever or aches or pains- take Tylenol or ibuprofen. -Bromfed by 5 ml by mouth for cough and congestion and cold symptoms -at home covid-19 test negative -If symptoms do not improve, she may need to be COVID tested to rule this out Follow up with worsening unresolved symptoms

## 2021-02-22 NOTE — Assessment & Plan Note (Signed)
Re establishing care. Completed assessment. Education provided on health maintenance and preventative care, patient verbalize understanding and will schedule physical exam with labs in a few weeks

## 2021-02-22 NOTE — Progress Notes (Signed)
New Patient Note  RE: Nicholas Brooks MRN: [id number] DOB: 02/18/1985 Date of Office Visit: 02/22/2021  Chief Complaint: New Patient (Initial Visit) ([facility] patient ), Establish Care, and chest congestion  (X 1 day )  History of Present Illness:   Patient is reestablishing care today with a complain of chest and head congestion  URI  This is a new problem. The current episode started yesterday. The problem has been unchanged. There has been no fever. Associated symptoms include congestion, coughing and headaches.   Patient's at home covid-19 test negative.    Assessment and Plan: Nicholas Brooks is a 36 y.o. male with: Chest congestion Take meds as prescribed - Use a cool mist humidifier  -Use saline nose sprays frequently -Force fluids -For fever or aches or pains- take Tylenol or ibuprofen. -Bromfed by 5 ml by mouth for cough and congestion and cold symptoms -at home covid-19 test negative -If symptoms do not improve, she may need to be COVID tested to rule this out Follow up with worsening unresolved symptoms  Establishing care with new doctor, encounter for Re establishing care. Completed assessment. Education provided on health maintenance and preventative care, patient verbalize understanding and will schedule physical exam with labs in a few weeks  Return if symptoms worsen or fail to improve.   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 120 mL 0   albuterol (PROVENTIL HFA) 108 (90 Base) MCG/ACT inhaler INHALE ONE TO TWO PUFFS INTO LUNGS EVERY 4 HOURS AS NEEDED FOR WHEEZING, SHORTNESS OF BREATH OR COUGH (Patient not taking: Reported on 02/22/2021) 1 each 5   budesonide-formoterol (SYMBICORT) 160-4.5 MCG/ACT inhaler Inhale 2 puffs into the lungs 2 (two) times daily. (Patient not taking: Reported on 02/22/2021) 1 Inhaler 5   No current facility-administered medications for this visit.   Allergies: No Known Allergies Social History: Social History   Socioeconomic History   Marital status: Married    Spouse name: Not on file   Number of children: 1   Years of education: Not on file   Highest education level: Not on file  Occupational History   Occupation: FEED MACHINE    Employer: [employer]  Tobacco Use   Smoking status: Every Day    Packs/day: 1.00    Years: 8.00    Pack years: 8.00    Types: Cigarettes    Start date: 02/14/2003   Smokeless tobacco: Never   Tobacco comments:    1/4 -1/2 PPD (08/30/16)  Vaping Use   Vaping Use: Never used  Substance and Sexual Activity   Alcohol use: No    Alcohol/week: 0.0 standard drinks   Drug use: No   Sexual activity: Yes  Other Topics Concern   Not on file  Social History Narrative   Not on file   Social Determinants of Health   Financial Resource Strain: Not on file  Food Insecurity: Not on file  Transportation Needs: Not on file  Physical Activity: Not on file  Stress: Not on file  Social Connections: Not on file       Family  History: Family History  Problem Relation Age of Onset   Healthy Mother    Asthma Sister    Cancer Maternal Grandmother        colon         Review of Systems  Constitutional: Negative.   HENT:  Positive for congestion and sinus pressure.   Eyes: Negative.   Respiratory:  Positive for cough.   Cardiovascular: Negative.   Gastrointestinal: Negative.   Neurological:  Positive for headaches.  All other systems reviewed and are negative. Objective: BP 131/87    Pulse 87    Temp 98.1 F (36.7 C) (Temporal)    Ht _0  (1.727 m)    Wt 238 lb 6.4 oz (108.1 kg)    SpO2 98%    BMI 36.25 kg/m  Body mass index is 36.25 kg/m. Physical Exam Vitals and nursing note reviewed.  Constitutional:      Appearance: He is obese.  HENT:     Head: Normocephalic.     Nose: Congestion present.     Mouth/Throat:     Mouth: Mucous membranes are moist.     Pharynx: Oropharynx is clear.  Eyes:     Conjunctiva/sclera: Conjunctivae normal.  Cardiovascular:     Rate and Rhythm: Normal rate and regular rhythm.  Pulmonary:     Effort: Pulmonary effort is normal.     Breath sounds: Normal breath sounds.  Abdominal:     General: Bowel sounds are normal.  Musculoskeletal:        General: Normal range of motion.  Skin:    General: Skin is warm.  Neurological:     General: No focal deficit present.     Mental Status: He is alert and oriented to person, place, and time.  Psychiatric:        Behavior: Behavior normal.   The plan was reviewed with the patient/family, and all questions/concerned were addressed.  It was my pleasure to see Nicholas Brooks today and participate in his care. Please feel free to contact me with any questions or concerns.  Sincerely,  Jac Canavan NP [place]

## 2021-02-22 NOTE — Patient Instructions (Addendum)
Upper Respiratory Infection, Adult An upper respiratory infection (URI) affects the nose, throat, and upper airways that lead to the lungs. The most common type of URI is often called the common cold. URIs usually get better on their own, without medical treatment. What are the causes? A URI is caused by a germ (virus). You may catch these germs by: Breathing in droplets from an infected person's cough or sneeze. Touching something that has the germ on it (is contaminated) and then touching your mouth, nose, or eyes. What increases the risk? You are more likely to get a URI if: You are very young or very old. You have close contact with others, such as at work, school, or a health care facility. You smoke. You have long-term (chronic) heart or lung disease. You have a weakened disease-fighting system (immune system). You have nasal allergies or asthma. You have a lot of stress. You have poor nutrition. What are the signs or symptoms? Runny or stuffy (congested) nose. Cough. Sneezing. Sore throat. Headache. Feeling tired (fatigue). Fever. Not wanting to eat as much as usual. Pain in your forehead, behind your eyes, and over your cheekbones (sinus pain). Muscle aches. Redness or irritation of the eyes. Pressure in the ears or face. How is this treated? URIs usually get better on their own within 7-10 days. Medicines cannot cure URIs, but your doctor may recommend certain medicines to help relieve symptoms, such as: Over-the-counter cold medicines. Medicines to reduce coughing (cough suppressants). Coughing is a type of defense against infection that helps to clear the nose, throat, windpipe, and lungs (respiratory system). Take these medicines only as told by your doctor. Medicines to lower your fever. Follow these instructions at home: Activity Rest as needed. If you have a fever, stay home from work or school until your fever is gone, or until your doctor says you may return to  work or school. You should stay home until you cannot spread the infection anymore (you are not contagious). Your doctor may have you wear a face mask so you have less risk of spreading the infection. Relieving symptoms Rinse your mouth often with salt water. To make salt water, dissolve -1 tsp (3-6 g) of salt in 1 cup (237 mL) of warm water. Use a cool-mist humidifier to add moisture to the air. This can help you breathe more easily. Eating and drinking  Drink enough fluid to keep your pee (urine) pale yellow. Eat soups and other clear broths. General instructions  Take over-the-counter and prescription medicines only as told by your doctor. Do not smoke or use any products that contain nicotine or tobacco. If you need help quitting, ask your doctor. Avoid being where people are smoking (avoid secondhand smoke). Stay up to date on all your shots (immunizations), and get the flu shot every year. Keep all follow-up visits. How to prevent the spread of infection to others  Wash your hands with soap and water for at least 20 seconds. If you cannot use soap and water, use hand sanitizer. Avoid touching your mouth, face, eyes, or nose. Cough or sneeze into a tissue or your sleeve or elbow. Do not cough or sneeze into your hand or into the air. Contact a doctor if: You are getting worse, not better. You have any of these: A fever or chills. Brown or red mucus in your nose. Yellow or brown fluid (discharge)coming from your nose. Pain in your face, especially when you bend forward. Swollen neck glands. Pain when you swallow.   White areas in the back of your throat. Get help right away if: You have shortness of breath that gets worse. You have very bad or constant: Headache. Ear pain. Pain in your forehead, behind your eyes, and over your cheekbones (sinus pain). Chest pain. You have long-lasting (chronic) lung disease along with any of these: Making high-pitched whistling sounds when  you breathe, most often when you breathe out (wheezing). Long-lasting cough (more than 14 days). Coughing up blood. A change in your usual mucus. You have a stiff neck. You have changes in your: Vision. Hearing. Thinking. Mood. These symptoms may be an emergency. Get help right away. Call 911. Do not wait to see if the symptoms will go away. Do not drive yourself to the hospital. Summary An upper respiratory infection (URI) is caused by a germ (virus). The most common type of URI is often called the common cold. URIs usually get better within 7-10 days. Take over-the-counter and prescription medicines only as told by your doctor. This information is not intended to replace advice given to you by your health care provider. Make sure you discuss any questions you have with your health care provider. Document Revised: 09/01/2020 Document Reviewed: 09/01/2020 Elsevier Patient Education  2022 Else[employer] Inc. Cough, Adult A cough helps to clear your throat and lungs. A cough may be a sign of an illness or another medical condition. An acute cough may only last 2-3 weeks, while a chronic cough may last 8 or more weeks. Many things can cause a cough. They include: Germs (viruses or bacteria) that attack the airway. Breathing in things that bother (irritate) your lungs. Allergies. Asthma. Mucus that runs down the back of your throat (postnasal drip). Smoking. Acid backing up from the stomach into the tube that moves food from the mouth to the stomach (gastroesophageal reflux). Some medicines. Lung problems. Other medical conditions, such as heart failure or a blood clot in the lung (pulmonary embolism). Follow these instructions at home: Medicines Take over-the-counter and prescription medicines only as told by your doctor. Talk with your doctor before you take medicines that stop a cough (cough suppressants). Lifestyle  Do not smoke, and try not to be around smoke. Do not use any  products that contain nicotine or tobacco, such as cigarettes, e-cigarettes, and chewing tobacco. If you need help quitting, ask your doctor. Drink enough fluid to keep your pee (urine) pale yellow. Avoid caffeine. Do not drink alcohol if your doctor tells you not to drink. General instructions  Watch for any changes in your cough. Tell your doctor about them. Always cover your mouth when you cough. Stay away from things that make you cough, such as perfume, candles, campfire smoke, or cleaning products. If the air is dry, use a cool mist vaporizer or humidifier in your home. If your cough is worse at night, try using extra pillows to raise your head up higher while you sleep. Rest as needed. Keep all follow-up visits as told by your doctor. This is important. Contact a doctor if: You have new symptoms. You cough up pus. Your cough does not get better after 2-3 weeks, or your cough gets worse. Cough medicine does not help your cough and you are not sleeping well. You have pain that gets worse or pain that is not helped with medicine. You have a fever. You are losing weight and you do not know why. You have night sweats. Get help right away if: You cough up blood. You have trouble breathing.   Your heartbeat is very fast. These symptoms may be an emergency. Do not wait to see if the symptoms will go away. Get medical help right away. Call your local emergency services (911 in the [place] Do not drive yourself to the hospital. Summary A cough helps to clear your throat and lungs. Many things can cause a cough. Take over-the-counter and prescription medicines only as told by your doctor. Always cover your mouth when you cough. Contact a doctor if you have new symptoms or you have a cough that does not get better or gets worse. This information is not intended to replace advice given to you by your health care provider. Make sure you discuss any questions you have with your health care  provider. Document Revised: 03/21/2019 Document Reviewed: 02/18/2018 Elsevier Patient Education  2022 Elsevier Inc.  

## 2021-04-18 ENCOUNTER — Ambulatory Visit: Payer: 59 | Admitting: Nurse Practitioner

## 2021-04-19 ENCOUNTER — Encounter: Payer: Self-pay | Admitting: Family Medicine

## 2021-04-19 ENCOUNTER — Ambulatory Visit (INDEPENDENT_AMBULATORY_CARE_PROVIDER_SITE_OTHER): Payer: 59 | Admitting: Family Medicine

## 2021-04-19 VITALS — BP 127/77 | HR 82 | Temp 97.9°F | Resp 20 | Ht 68.0 in | Wt 236.0 lb

## 2021-04-19 DIAGNOSIS — M7712 Lateral epicondylitis, left elbow: Secondary | ICD-10-CM | POA: Diagnosis not present

## 2021-04-19 DIAGNOSIS — R935 Abnormal findings on diagnostic imaging of other abdominal regions, including retroperitoneum: Secondary | ICD-10-CM

## 2021-04-19 DIAGNOSIS — Z6835 Body mass index (BMI) 35.0-35.9, adult: Secondary | ICD-10-CM

## 2021-04-19 DIAGNOSIS — R1011 Right upper quadrant pain: Secondary | ICD-10-CM

## 2021-04-19 DIAGNOSIS — R1900 Intra-abdominal and pelvic swelling, mass and lump, unspecified site: Secondary | ICD-10-CM

## 2021-04-19 MED ORDER — KETOROLAC TROMETHAMINE 30 MG/ML IJ SOLN
30.0000 mg | Freq: Once | INTRAMUSCULAR | Status: AC
  Administered 2021-04-19: 30 mg via INTRAMUSCULAR

## 2021-04-19 MED ORDER — PREDNISONE 20 MG PO TABS: 40.0000 mg | ORAL_TABLET | Freq: Every day | ORAL | 0 refills | Status: AC

## 2021-04-19 NOTE — Progress Notes (Signed)
Subjective:  Patient ID: Nicholas Brooks, male    DOB: 04/08/1985, 36 y.o.   MRN: [id number]  Patient Care Team: Ivy Lynn, NP as PCP - General (Nurse Practitioner)   Chief Complaint:  hand and arm pain (Left - below elbow/)   HPI: Nicholas Brooks is a 36 y.o. 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 There was no injury mechanism. The pain is present in the left elbow. The quality of the pain is described as aching and shooting. The pain radiates to the left arm. The pain is at a severity of 5/10. The pain is moderate. The pain has been Fluctuating since the incident. Pertinent negatives include no chest pain, muscle weakness, numbness or tingling. The symptoms are aggravated by movement, palpation and lifting. He has tried nothing for the symptoms.  Abdominal Pain This is a recurrent problem. The current episode started more than 1 year ago. The problem occurs intermittently. The problem has been waxing and waning. The pain is located in the RUQ and periumbilical region. The pain is at a severity of 3/10. The pain is mild. The quality of the pain is aching and cramping. The abdominal pain does not radiate. Associated symptoms include arthralgias and myalgias. Pertinent negatives include no anorexia, belching, constipation, diarrhea, dysuria, fever, flatus, frequency, headaches, hematochezia, hematuria, melena, nausea, vomiting or weight loss. The pain is aggravated by palpation and certain positions (heavy lifting). He has tried nothing for the symptoms. Prior diagnostic workup includes ultrasound.    Relevant past medical, surgical, family, and social history reviewed and updated as indicated.  Allergies and medications reviewed and updated. Data reviewed: Chart in Epic.   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    Vision abnormalities     Past Surgical History:  Procedure Laterality Date   MANDIBLE SURGERY     WISDOM TOOTH EXTRACTION      Social History   Socioeconomic History   Marital status: Married    Spouse name: Not on file   Number of children: 1   Years of education: Not on file   Highest education level: Not on file  Occupational History   Occupation: FEED MACHINE    Employer: [employer]  Tobacco Use   Smoking status: Every Day    Packs/day: 1.00    Years: 8.00    Pack years: 8.00    Types: Cigarettes    Start date: 02/14/2003   Smokeless tobacco: Never   Tobacco comments:    1/4 -1/2 PPD (08/30/16)  Vaping Use   Vaping Use: Never used  Substance and Sexual Activity   Alcohol use: No    Alcohol/week: 0.0 standard drinks   Drug use: No   Sexual activity: Yes  Other Topics Concern   Not on file  Social History Narrative   Not on file   Social Determinants of Health   Financial Resource Strain: Not on file  Food Insecurity: Not on file  Transportation Needs: Not on file  Physical Activity: Not on file  Stress: Not on file  Social Connections: Not on file  Intimate Partner Violence: Not on file    Outpatient Encounter Medications as of 04/19/2021  Medication Sig   predniSONE (DELTASONE) 20 MG tablet Take  2 tablets (40 mg total) by mouth daily with breakfast for 5 days.   albuterol (PROVENTIL HFA) 108 (90 Base) MCG/ACT inhaler INHALE ONE TO TWO PUFFS INTO LUNGS EVERY 4 HOURS AS NEEDED FOR WHEEZING, SHORTNESS OF BREATH OR COUGH (Patient not taking: Reported on 04/19/2021)   albuterol (PROVENTIL) (2.5 MG/3ML) 0.083% nebulizer solution USE ONE VIAL IN NEBULIZER EVERY 4 HOURS AS NEEDED FOR WHEEZING (Patient not taking: Reported on 04/19/2021)   [DISCONTINUED] brompheniramine-pseudoephedrine-DM 30-2-10 MG/5ML syrup Take 5 mLs by mouth 4 (four) times daily as needed.   [DISCONTINUED] budesonide-formoterol (SYMBICORT) 160-4.5 MCG/ACT inhaler  Inhale 2 puffs into the lungs 2 (two) times daily. (Patient not taking: Reported on 02/22/2021)   No facility-administered encounter medications on file as of 04/19/2021.    No Known Allergies  Review of Systems  Constitutional:  Negative for activity change, appetite change, chills, diaphoresis, fatigue, fever and weight loss.  Respiratory:  Negative for cough and shortness of breath.   Cardiovascular:  Negative for chest pain, palpitations and leg swelling.  Gastrointestinal:  Positive for abdominal pain. Negative for abdominal distention, anal bleeding, anorexia, blood in stool, constipation, diarrhea, flatus, hematochezia, melena, nausea, rectal pain and vomiting.  Genitourinary:  Negative for decreased urine volume, difficulty urinating, dysuria, flank pain, frequency, hematuria and urgency.  Musculoskeletal:  Positive for arthralgias and myalgias. Negative for back pain, gait problem, joint swelling, neck pain and neck stiffness.  Neurological:  Negative for dizziness, tingling, tremors, seizures, syncope, facial asymmetry, speech difficulty, weakness, light-headedness, numbness and headaches.  Psychiatric/Behavioral:  Negative for confusion.   All other systems reviewed and are negative.      Objective:  BP 127/77    Pulse 82    Temp 97.9 F (36.6 C)    Resp 20    Ht 5' 8" (1.727 m)    Wt 236 lb (107 kg)    SpO2 98%    BMI 35.88 kg/m    Wt Readings from Last 3 Encounters:  04/19/21 236 lb (107 kg)  02/22/21 238 lb 6.4 oz (108.1 kg)  08/15/18 230 lb (104.3 kg)    Physical Exam Vitals and nursing note reviewed.  Constitutional:      General: He is not in acute distress.    Appearance: Normal appearance. He is obese. He is not ill-appearing, toxic-appearing or diaphoretic.  HENT:     Head: Normocephalic and atraumatic.     Mouth/Throat:     Mouth: Mucous membranes are moist.  Eyes:     Conjunctiva/sclera: Conjunctivae normal.     Pupils: Pupils are equal, round, and  reactive to light.  Cardiovascular:     Rate and Rhythm: Normal rate and regular rhythm.     Heart sounds: Normal heart sounds. No murmur heard.   No friction rub. No gallop.  Pulmonary:     Effort: Pulmonary effort is normal.     Breath sounds: Normal breath sounds.  Abdominal:     General: Abdomen is protuberant. Bowel sounds are normal. There is no distension or abdominal bruit. There are no signs of injury.     Palpations: Abdomen is soft.     Tenderness: There is no abdominal tenderness. There is no right CVA tenderness, left CVA tenderness, guarding or rebound. Negative signs include Murphy's sign, Rovsing's sign, McBurney's sign, psoas sign and obturator sign.     Hernia: No hernia is present.  Musculoskeletal:     Left upper arm: Normal.     Right elbow: Normal.  elbow: No swelling, deformity, effusion or lacerations. Normal range of motion. Tenderness (over lateral epiconyle) present in lateral epicondyle. No radial head, medial epicondyle or olecranon process tenderness.  °   Right forearm: Normal.  °   Left forearm: Normal.  °   Comments: LUE: elbow pain with wrist flexion and resisted wrist extension  °Skin: °   General: Skin is warm and dry.  °   Capillary Refill: Capillary refill takes less than 2 seconds.  °Neurological:  °   General: No focal deficit present.  °   Mental Status: He is alert and oriented to person, place, and time.  °Psychiatric:     °   Mood and Affect: Mood normal.     °   Behavior: Behavior normal.     °   Thought Content: Thought content normal.     °   Judgment: Judgment normal.  ° ° °Results for orders placed or performed in visit on 03/21/19  °Novel Coronavirus, NAA ([facility])  ° Specimen: Nasopharyngeal(NP) swabs in vial transport medium  ° NASOPHARYNGE  TESTING  °Result Value Ref Range  ° SARS-CoV-2, NAA Not Detected Not Detected  ° °   ° °Pertinent labs & imaging results that were available during my care of the patient were reviewed by me and  considered in my medical decision making. ° °Assessment & Plan:  °Reg was seen today for hand and arm pain. ° °Diagnoses and all orders for this visit: ° °Lateral epicondylitis of left elbow °Classic tennis elbow. Will burst with toradol in office and prednisone as prescribed. Pt aware to get counterforce strap and wear. Symptomatic care discussed in detail. Pt aware to report any new, worsening, or persistent symptoms.  °-     predniSONE (DELTASONE) 20 MG tablet; Take 2 tablets (40 mg total) by mouth daily with breakfast for 5 days. ° °RUQ abdominal pain °Chronic in nature. No indications of acute process on physical exam. Previous US unremarkable. Will obtain CT abdomen for further evaluation along with below labs.  °-     CMP14+EGFR °-     CBC with Differential/Platelet °-     CT Abdomen Pelvis W Contrast; Future ° °BMI 35.0-35.9,adult °Diet and exercise encouraged. Labs pending.  °-     CMP14+EGFR °-     CBC with Differential/Platelet °-     Lipid panel °-     Thyroid Panel With TSH ° °  ° °Continue all other maintenance medications. ° °Follow up plan: °Return in about 6 weeks (around 05/31/2021), or if symptoms worsen or fail to improve. ° ° °Continue healthy lifestyle choices, including diet (rich in fruits, vegetables, and lean proteins, and low in salt and simple carbohydrates) and exercise (at least 30 minutes of moderate physical activity daily). ° °Educational handout given for tennis elbow ° °The above assessment and management plan was discussed with the patient. The patient verbalized understanding of and has agreed to the management plan. Patient is aware to call the clinic if they develop any new symptoms or if symptoms persist or worsen. Patient is aware when to return to the clinic for a follow-up visit. Patient educated on when it is appropriate to go to the emergency department.  ° °Michelle Rakes, FNP-C °[facility] °[phone] ° ° °

## 2021-04-20 LAB — CBC WITH DIFFERENTIAL/PLATELET
Basophils Absolute: 0.1 10*3/uL (ref 0.0–0.2)
Basos: 1 %
EOS (ABSOLUTE): 0.2 10*3/uL (ref 0.0–0.4)
Eos: 2 %
Hematocrit: 43.6 % (ref 37.5–51.0)
Hemoglobin: 15 g/dL (ref 13.0–17.7)
Immature Grans (Abs): 0 10*3/uL (ref 0.0–0.1)
Immature Granulocytes: 0 %
Lymphocytes Absolute: 2.5 10*3/uL (ref 0.7–3.1)
Lymphs: 26 %
MCH: 30.2 pg (ref 26.6–33.0)
MCHC: 34.4 g/dL (ref 31.5–35.7)
MCV: 88 fL (ref 79–97)
Monocytes Absolute: 0.9 10*3/uL (ref 0.1–0.9)
Monocytes: 9 %
Neutrophils Absolute: 5.8 10*3/uL (ref 1.4–7.0)
Neutrophils: 62 %
Platelets: 298 10*3/uL (ref 150–450)
RBC: 4.96 x10E6/uL (ref 4.14–5.80)
RDW: 14 % (ref 11.6–15.4)
WBC: 9.5 10*3/uL (ref 3.4–10.8)

## 2021-04-20 LAB — CMP14+EGFR
ALT: 27 IU/L (ref 0–44)
AST: 16 IU/L (ref 0–40)
Albumin/Globulin Ratio: 1.6 (ref 1.2–2.2)
Albumin: 4.2 g/dL (ref 4.0–5.0)
Alkaline Phosphatase: 92 IU/L (ref 44–121)
BUN/Creatinine Ratio: 15 (ref 9–20)
BUN: 14 mg/dL (ref 6–20)
Bilirubin Total: <0.2 mg/dL (ref 0.0–1.2)
CO2: 24 mmol/L (ref 20–29)
Calcium: 9 mg/dL (ref 8.7–10.2)
Chloride: 102 mmol/L (ref 96–106)
Creatinine, Ser: 0.91 mg/dL (ref 0.76–1.27)
Globulin, Total: 2.7 g/dL (ref 1.5–4.5)
Glucose: 100 mg/dL — ABNORMAL HIGH (ref 70–99)
Potassium: 4.3 mmol/L (ref 3.5–5.2)
Sodium: 140 mmol/L (ref 134–144)
Total Protein: 6.9 g/dL (ref 6.0–8.5)
eGFR: 114 mL/min/{1.73_m2} (ref >59)

## 2021-04-20 LAB — THYROID PANEL WITH TSH
Free Thyroxine Index: 2 (ref 1.2–4.9)
T3 Uptake Ratio: 23 % — ABNORMAL LOW (ref 24–39)
T4, Total: 8.5 ug/dL (ref 4.5–12.0)
TSH: 0.809 u[IU]/mL (ref 0.450–4.500)

## 2021-04-20 LAB — LIPID PANEL
Chol/HDL Ratio: 5.9 ratio — ABNORMAL HIGH (ref 0.0–5.0)
Cholesterol, Total: 176 mg/dL (ref 100–199)
HDL: 30 mg/dL — ABNORMAL LOW (ref >39)
LDL Chol Calc (NIH): 121 mg/dL — ABNORMAL HIGH (ref 0–99)
Triglycerides: 139 mg/dL (ref 0–149)
VLDL Cholesterol Cal: 25 mg/dL (ref 5–40)

## 2021-04-29 ENCOUNTER — Other Ambulatory Visit: Payer: Self-pay

## 2021-04-29 ENCOUNTER — Encounter (HOSPITAL_BASED_OUTPATIENT_CLINIC_OR_DEPARTMENT_OTHER): Payer: Self-pay

## 2021-04-29 ENCOUNTER — Ambulatory Visit (HOSPITAL_BASED_OUTPATIENT_CLINIC_OR_DEPARTMENT_OTHER)
Admission: RE | Admit: 2021-04-29 | Discharge: 2021-04-29 | Disposition: A | Discharge status: Home / Self Care | Payer: Self-pay | Source: Ambulatory Visit | Attending: Family Medicine | Admitting: Family Medicine

## 2021-04-29 DIAGNOSIS — R1011 Right upper quadrant pain: Secondary | ICD-10-CM

## 2021-04-29 IMAGING — CT CT ABD-PELV W/ CM
2 of 4 series · 17 of 46 positions shown, 19 images · IV contrast (APPLIED)
Comparison: None.

CLINICAL DATA: Right lower quadrant pain.

EXAM:
CT ABDOMEN AND PELVIS WITH CONTRAST
TECHNIQUE: Multidetector CT imaging of the abdomen and pelvis was performed
using the standard protocol following bolus administration of
intravenous contrast.

[Series 2: abd pel w · axial · 0.79mm/px · z∈[+751,+1141]mm · 14 of 92 slices shown, 16 images]
[im 7/92  soft-tissue]
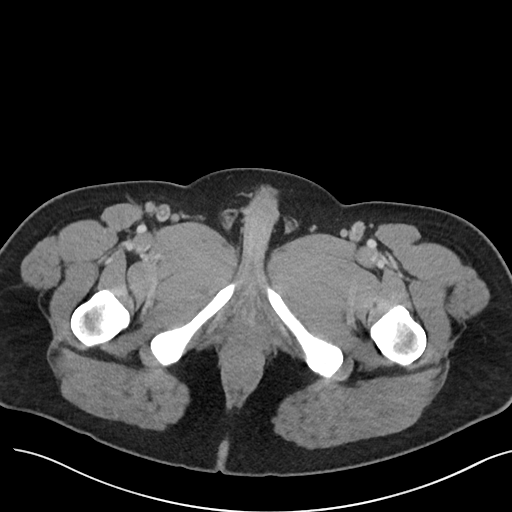
[im 7/92  bone]
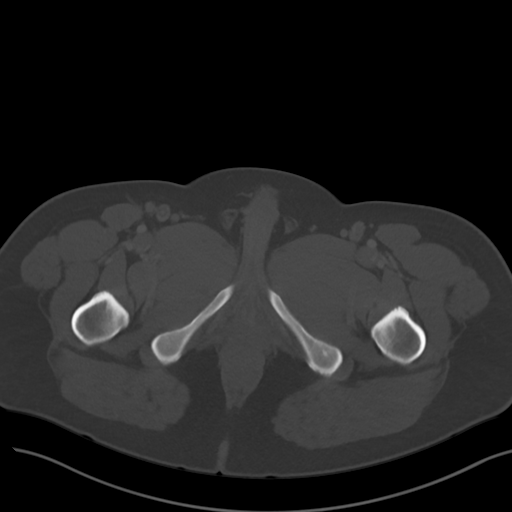
[im 13/92  soft-tissue]
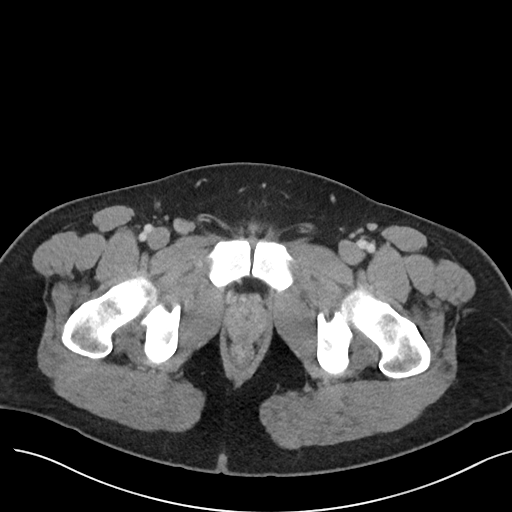
[im 19/92  soft-tissue]
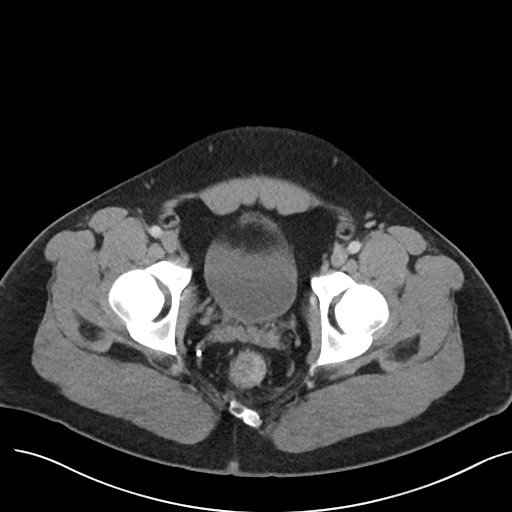
[im 25/92  soft-tissue]
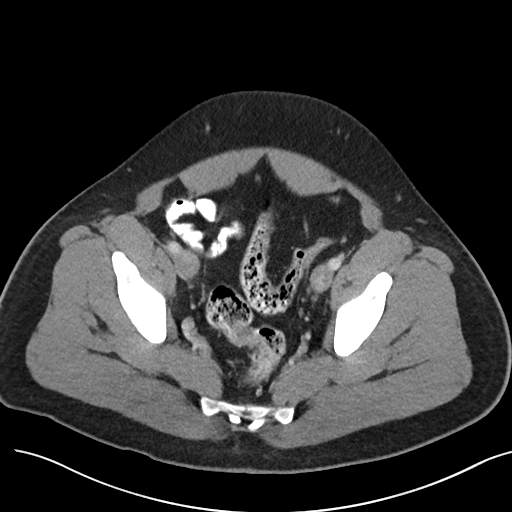
[im 31/92  soft-tissue]
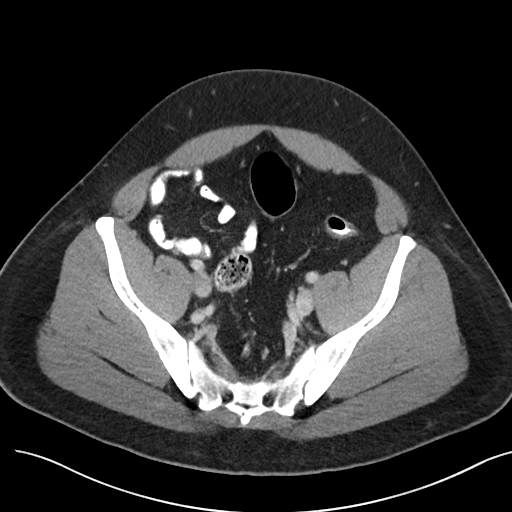
[im 37/92  soft-tissue]
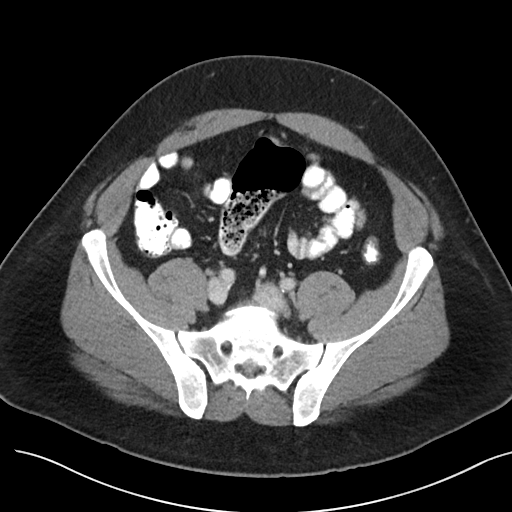
[im 43/92  soft-tissue]
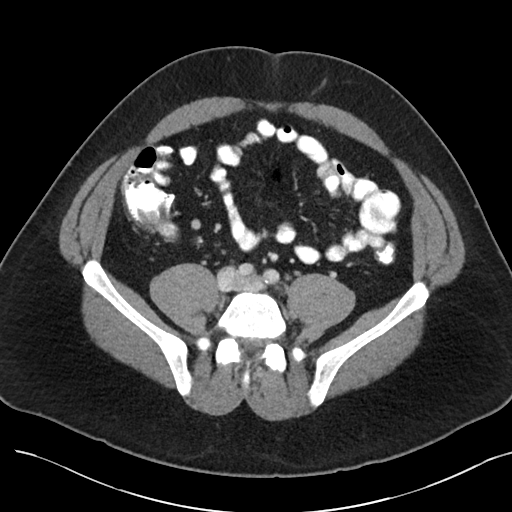
[im 49/92  soft-tissue]
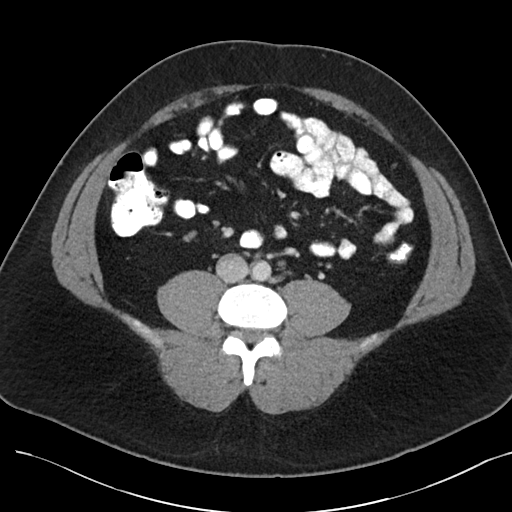
[im 55/92  soft-tissue]
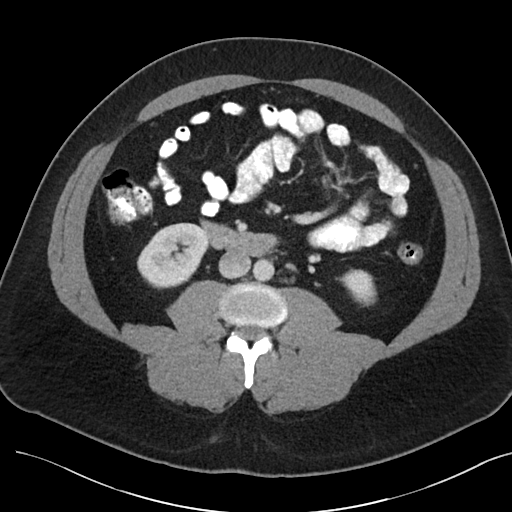
[im 55/92  bone]
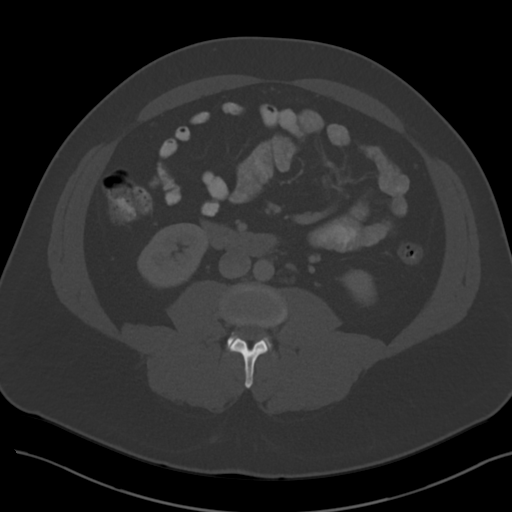
[im 61/92  soft-tissue]
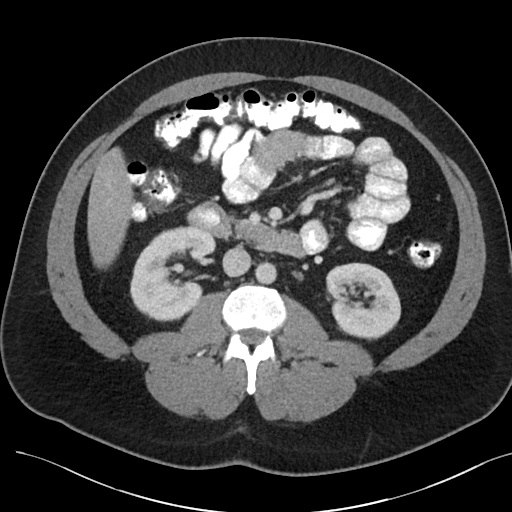
[im 67/92  soft-tissue]
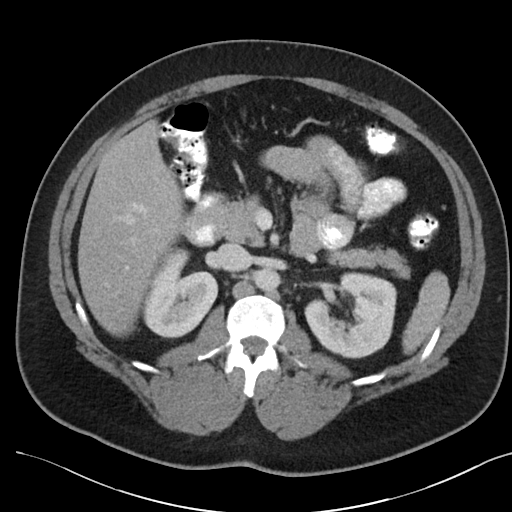
[im 73/92  soft-tissue]
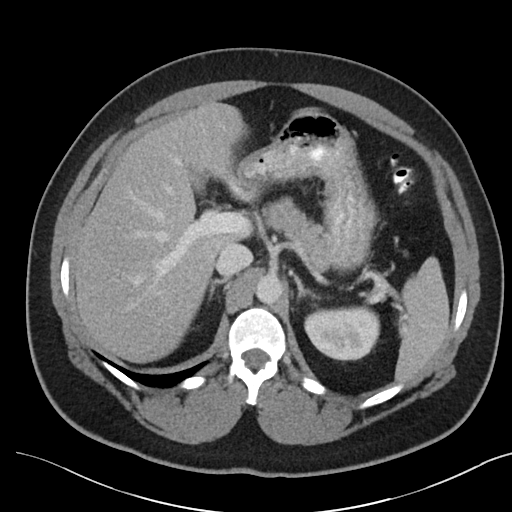
[im 79/92  soft-tissue]
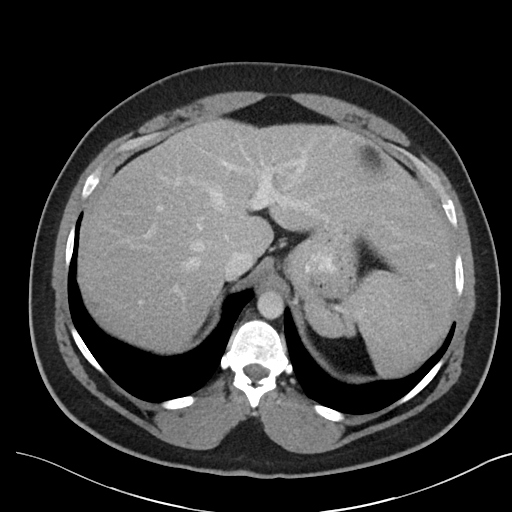
[im 85/92  soft-tissue]
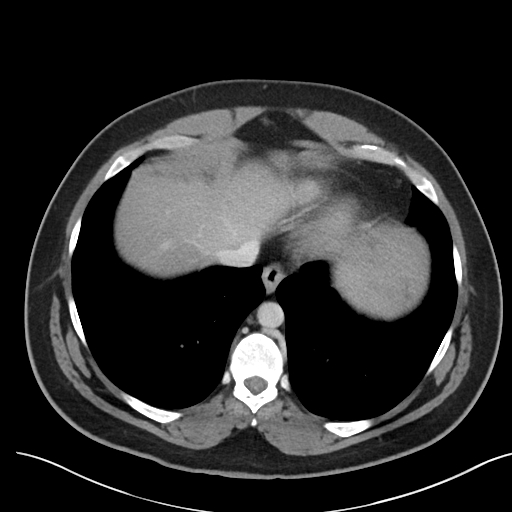

[Series 5: coronal · coronal · 0.90mm/px · 3 of 110 slices shown]
[im 37/110  soft-tissue]
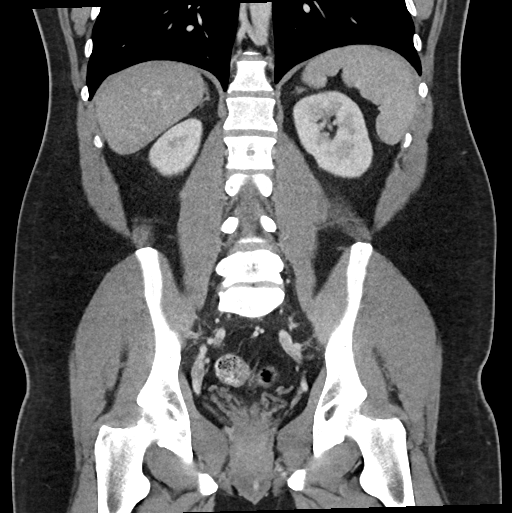
[im 49/110  soft-tissue]
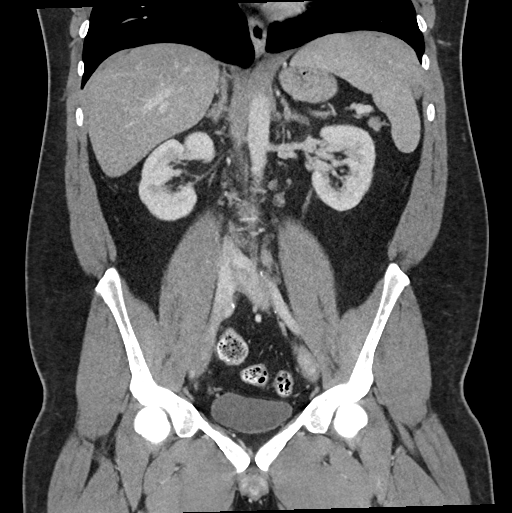
[im 61/110  soft-tissue]
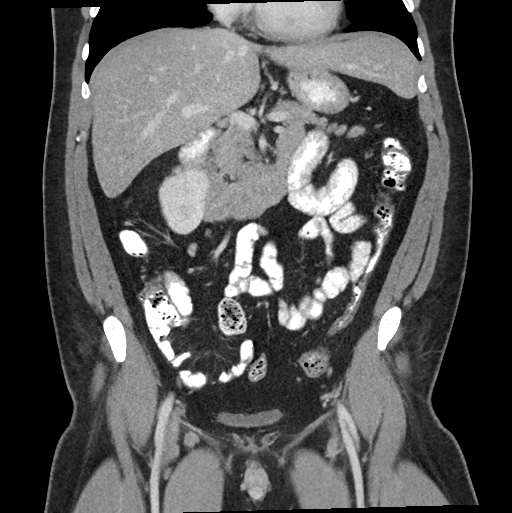

[17 of 46 positions shown; findings below may reference images not displayed]

RADIATION DOSE REDUCTION: This exam was performed according to the
departmental dose-optimization program which includes automated
exposure control, adjustment of the mA and/or kV according to
patient size and/or use of iterative reconstruction technique.

CONTRAST:  100mL OMNIPAQUE IOHEXOL 300 MG/ML  SOLN
FINDINGS: Lower chest: No acute abnormality.

Hepatobiliary: No focal liver abnormality is seen. No gallstones,
gallbladder wall thickening, or biliary dilatation.

Pancreas: There is a subcentimeter hypodensity in the uncinate
process of the pancreas measuring 6 mm image [DATE]. Otherwise the
pancreas is within normal limits.

Spleen: Normal in size without focal abnormality.

Adrenals/Urinary Tract: Adrenal glands are unremarkable. Kidneys are
normal, without renal calculi, focal lesion, or hydronephrosis.
Bladder is unremarkable.

Stomach/Bowel: Stomach is within normal limits. No evidence of bowel
wall thickening, distention, or inflammatory changes. The appendix
is not visualized.

Vascular/Lymphatic: There are nonenlarged bilateral inguinal lymph
nodes. Aorta and IVC are normal in size.

Reproductive: Prostate is unremarkable.

Other: No abdominal wall hernia or abnormality. No abdominopelvic
ascites.

Musculoskeletal: No acute or significant osseous findings.
IMPRESSION: 1. No acute localizing process in the abdomen or pelvis.
2. Subcentimeter hypodensity in the uncinate process of the
pancreas, indeterminate, possibly cystic recommend further
evaluation with pancreatic MRI

## 2021-04-29 MED ORDER — IOHEXOL 300 MG/ML  SOLN
100.0000 mL | Freq: Once | INTRAMUSCULAR | Status: AC | PRN
  Administered 2021-04-29: 100 mL via INTRAVENOUS

## 2021-05-02 NOTE — Addendum Note (Signed)
Addended by: Sonny Masters on: 05/02/2021 10:34 PM ? ? Modules accepted: Orders ? ?

## 2021-05-19 ENCOUNTER — Other Ambulatory Visit: Payer: Self-pay | Admitting: Family Medicine

## 2021-05-19 ENCOUNTER — Ambulatory Visit (HOSPITAL_COMMUNITY)
Admission: RE | Admit: 2021-05-19 | Discharge: 2021-05-19 | Disposition: A | Discharge status: Home / Self Care | Payer: Self-pay | Source: Ambulatory Visit | Attending: Family Medicine | Admitting: Family Medicine

## 2021-05-19 DIAGNOSIS — R1011 Right upper quadrant pain: Secondary | ICD-10-CM | POA: Insufficient documentation

## 2021-05-19 DIAGNOSIS — R935 Abnormal findings on diagnostic imaging of other abdominal regions, including retroperitoneum: Secondary | ICD-10-CM | POA: Insufficient documentation

## 2021-05-19 DIAGNOSIS — R1900 Intra-abdominal and pelvic swelling, mass and lump, unspecified site: Secondary | ICD-10-CM | POA: Insufficient documentation

## 2021-05-19 IMAGING — MR MR ABDOMEN WO/W CM
1 series · 4 of 19 positions shown · IV contrast (gadavist)
Comparison: [DATE] CT scan

CLINICAL DATA: Hypodense pancreatic lesion on CT scan from
[DATE], for further characterization.

EXAM:
MRI ABDOMEN WITHOUT AND WITH CONTRAST
TECHNIQUE: Multiplanar multisequence MR imaging of the abdomen was performed
both before and after the administration of intravenous contrast.
CONTRAST:  10mL GADAVIST GADOBUTROL 1 MMOL/ML IV SOLN

[Series 1009: MRCP · sagittal · 0.6mm · 0.31mm/px · 4 of 19 slices shown]
[im 2/19]
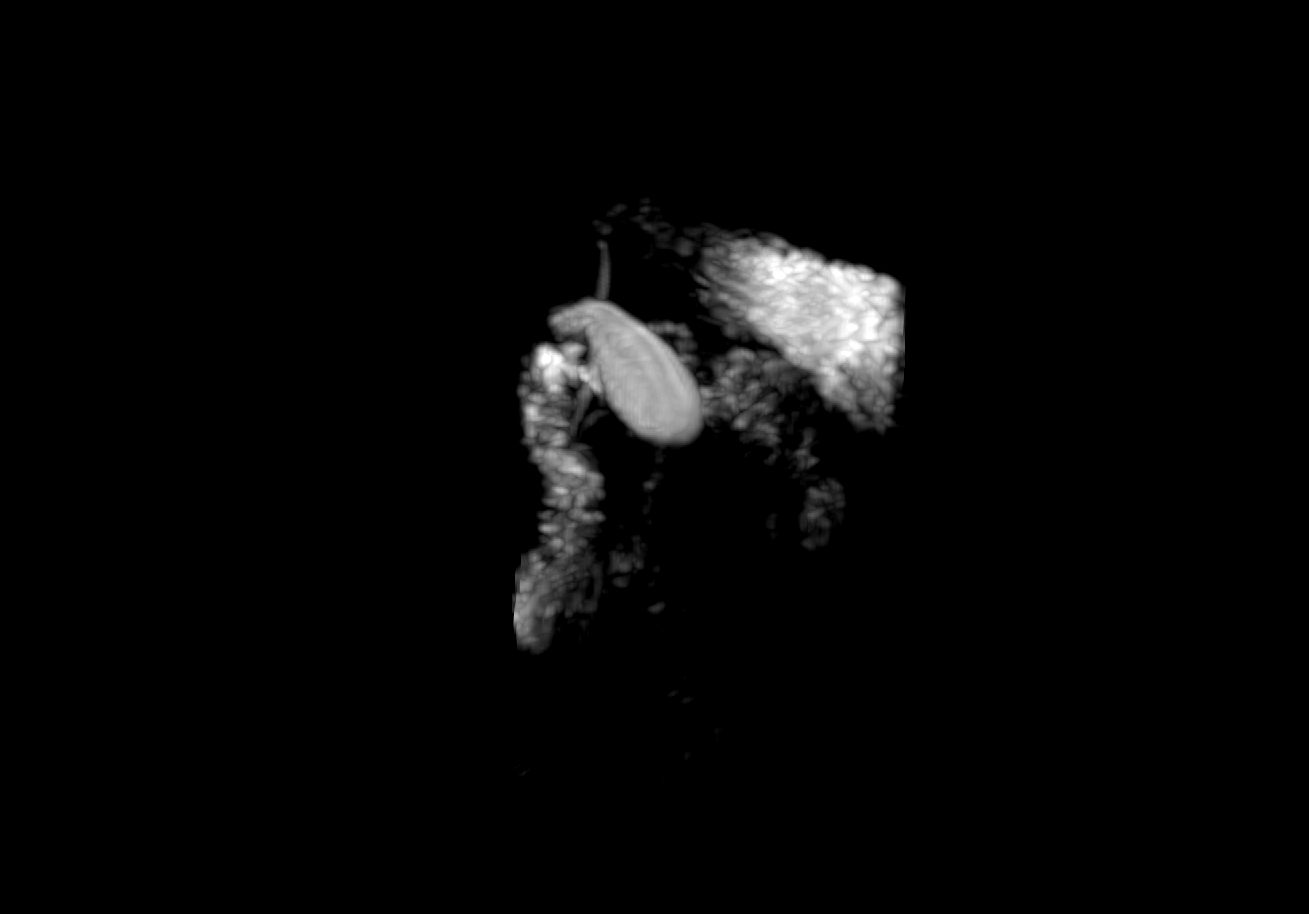
[im 4/19]
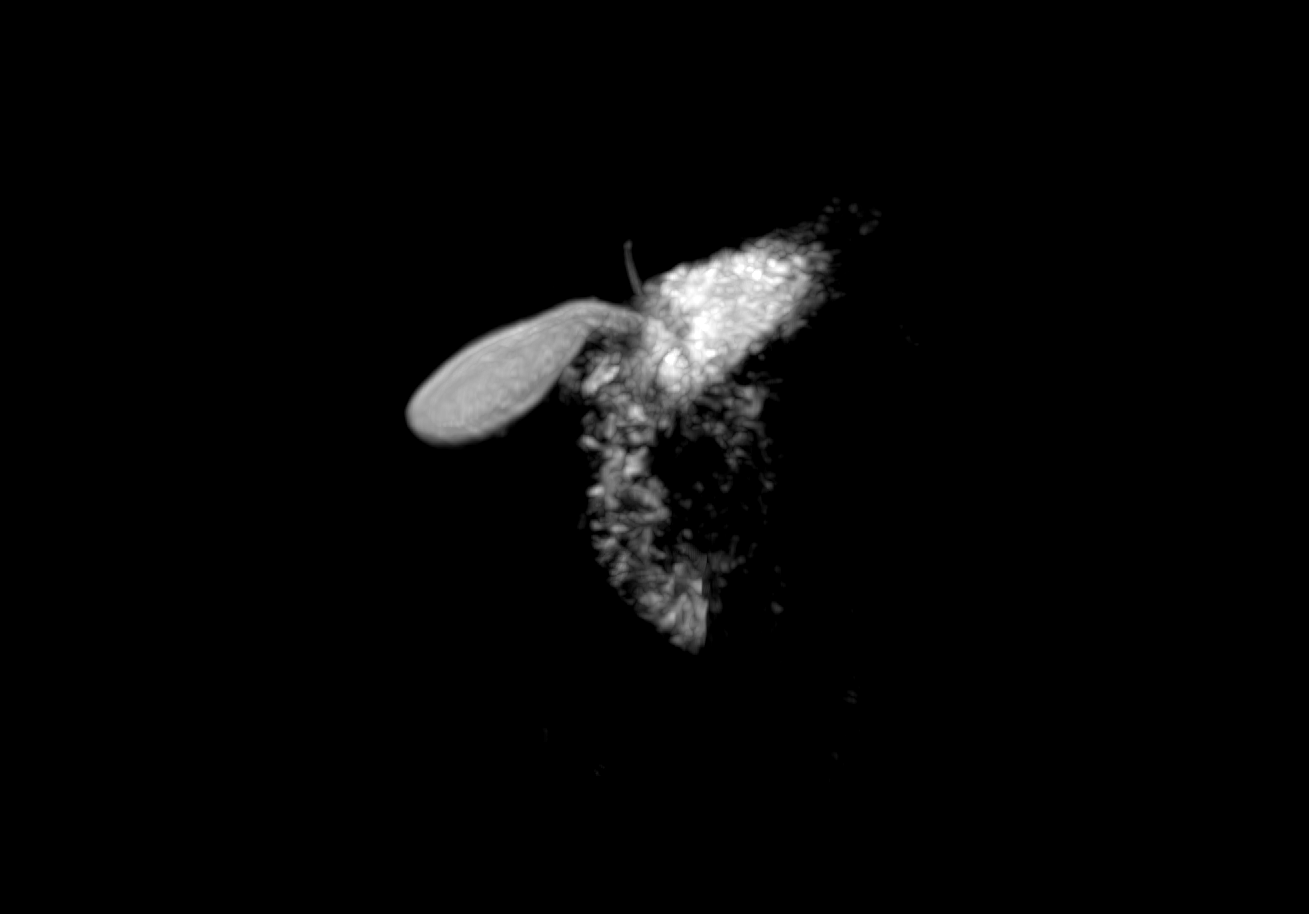
[im 10/19]
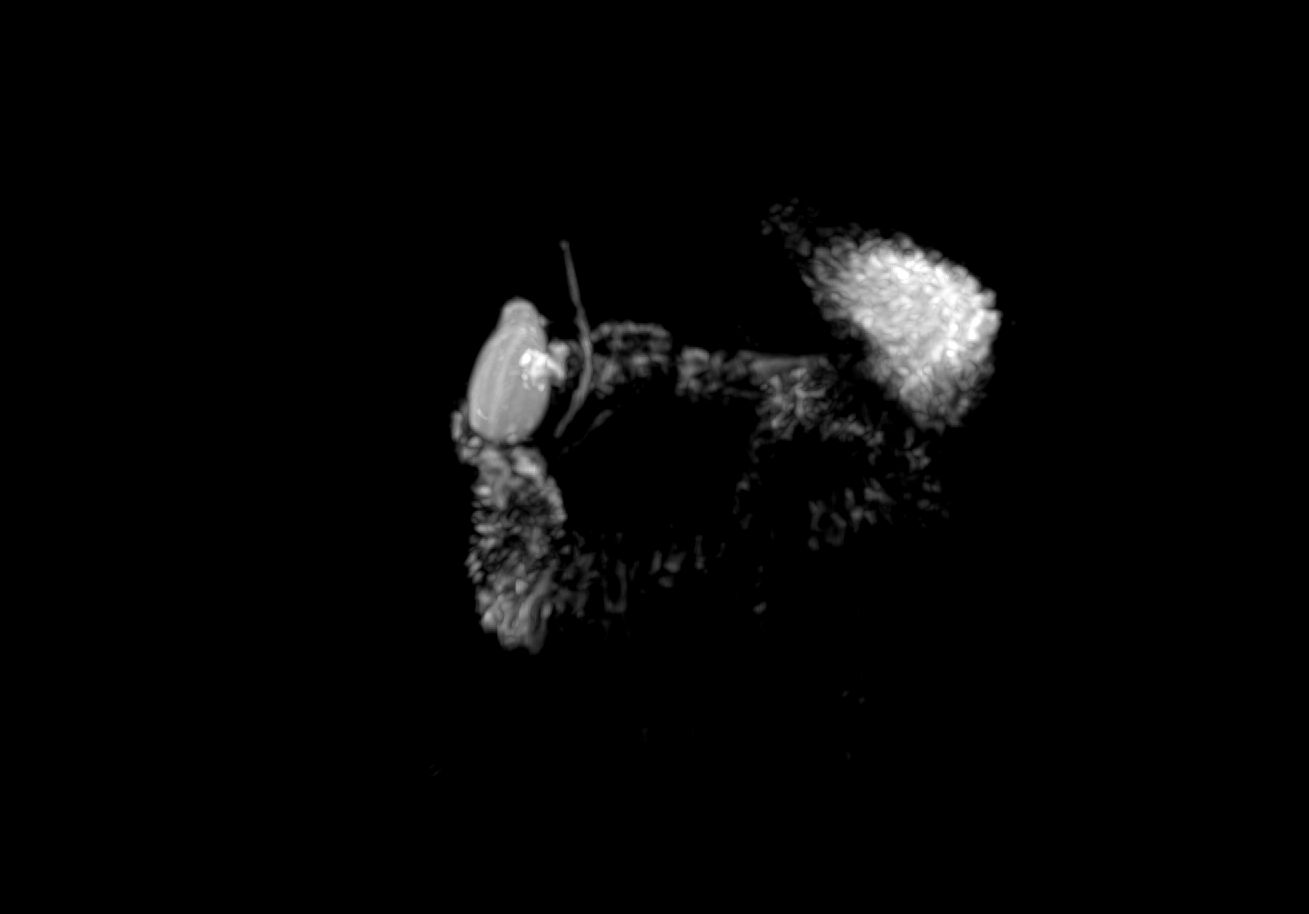
[im 16/19]
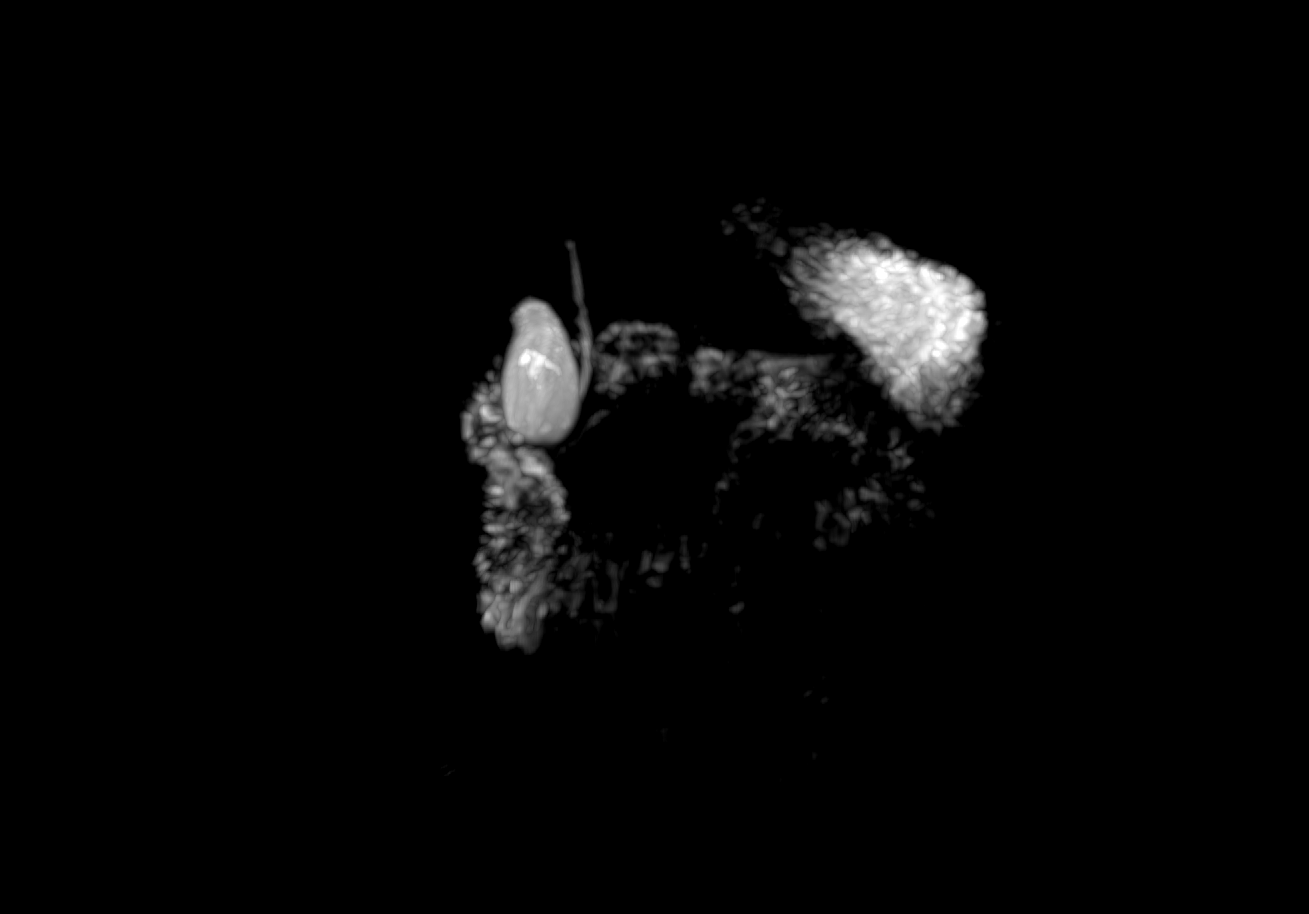

[4 of 19 positions shown; findings below may reference images not displayed]

FINDINGS: Despite efforts by the technologist and patient, motion artifact is
present on today's exam and could not be eliminated. This reduces
exam sensitivity and specificity.

Lower chest: Unremarkable

Hepatobiliary: 6 mm T2 hyperintense lesion in the dome of the right
hepatic lobe on image 22 series 3 demonstrates arterial phase
enhancement and delayed phase enhancement compatible with a flash
filling hemangioma. The liver appears otherwise unremarkable. No
biliary dilatation.

Pancreas: The region of concern on recent CT appears to correspond
to a tiny amount of fat signal intensity stippling in the pancreas
on today's MRI. There is no fluid signal intensity in this vicinity
nor is there abnormal parenchymal tissue identified. The appearance
on CT is thought to represent volume averaging of a small amount of
incidental fat stippling along the pancreatic head/uncinate process.
No worrisome pancreatic lesion identified.

Spleen:  Unremarkable

Adrenals/Urinary Tract:  Unremarkable

Stomach/Bowel: Unremarkable

Vascular/Lymphatic:  Unremarkable

Other:  No supplemental non-categorized findings.

Musculoskeletal: Degenerative disc disease at L5-S1.
IMPRESSION: 1. The area of concern on prior CT represents incidental benign fat
stippling along the pancreatic head. There is no cystic lesion or
lesion of concern and no further workup is needed.
2. Degenerative disc disease at L5-S1.
3. 6 mm benign hemangioma in the dome of the liver, this requires no
further workup.

## 2021-05-19 IMAGING — MR MR 3D RECON AT SCANNER
20 of 21 series · 45 of 48 positions shown · IV contrast (gadavist)
Comparison: [DATE] CT scan

CLINICAL DATA: Hypodense pancreatic lesion on CT scan from
[DATE], for further characterization.

EXAM:
MRI ABDOMEN WITHOUT AND WITH CONTRAST
TECHNIQUE: Multiplanar multisequence MR imaging of the abdomen was performed
both before and after the administration of intravenous contrast.
CONTRAST:  10mL GADAVIST GADOBUTROL 1 MMOL/ML IV SOLN

[Series 3: cor haste · coronal · 6.0mm · 1.25mm/px · 1 of 42 slices shown]
[im 1/42]
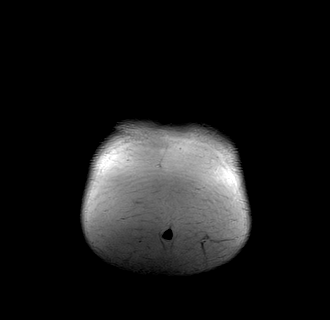

[Series 4: T2 fat-sat · axial · 6.0mm · 1.25mm/px · 1 of 35 slices shown]
[im 1/35]
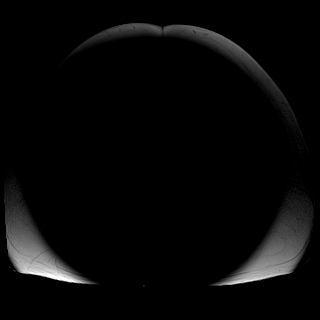

[Series 5: DWI · axial · 6.0mm · 1.57mm/px · 1 of 35 slices shown (1 of 4)]
[im 1/35]
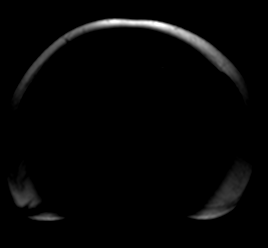

[Series 5: DWI · axial · 6.0mm · 1.57mm/px · 1 of 35 slices shown (2 of 4)]
[im 1/35]
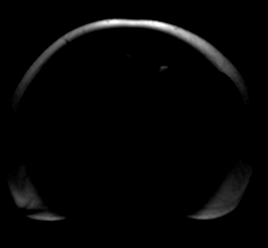

[Series 5: DWI · axial · 6.0mm · 1.57mm/px · 1 of 35 slices shown (3 of 4)]
[im 1/35]
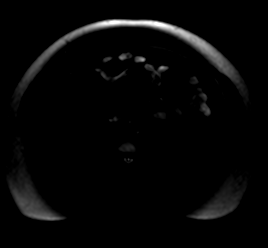

[Series 6: DWI · axial · 6.0mm · 1.57mm/px · 1 of 35 slices shown (4 of 4)]
[im 1/35]
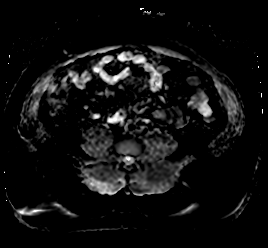

[Series 7: ax in and · axial · 3.0mm · 1.31mm/px · z∈[-116,+121]mm · 3 of 80 slices shown (1 of 2)]
[im 1/80]
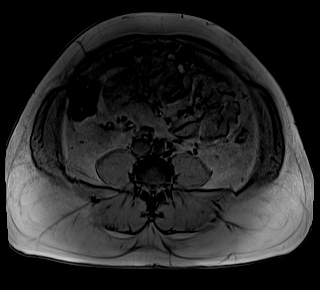
[im 40/80]
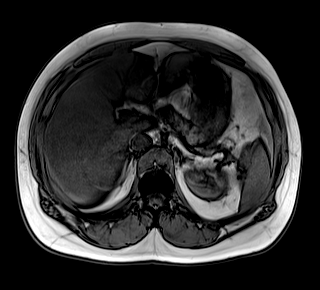
[im 80/80]
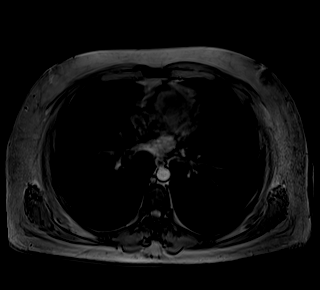

[Series 8: ax in and · axial · 3.0mm · 1.31mm/px · z∈[-116,+121]mm · 3 of 80 slices shown (2 of 2)]
[im 1/80]
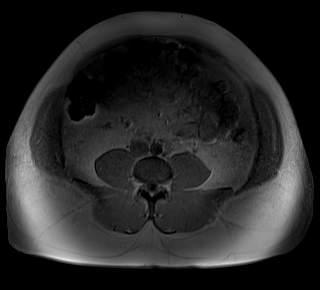
[im 40/80]
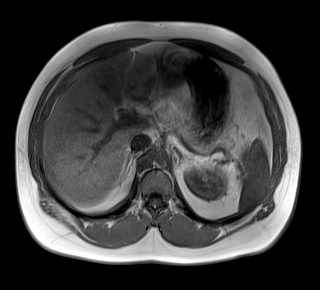
[im 80/80]
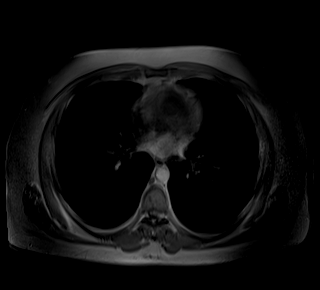

[Series 9: ax haste bh · axial · 6.0mm · 1.31mm/px · 1 of 35 slices shown]
[im 1/35]
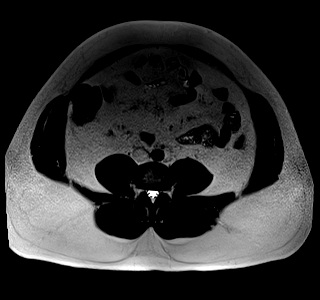

[Series 13: MRCP · coronal · 4.0mm · 1.12mm/px · 1 of 15 slices shown]
[im 1/15]
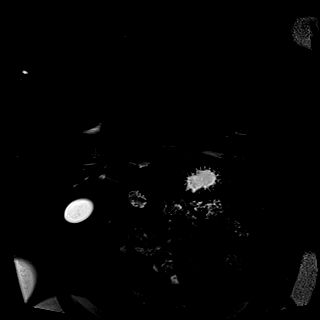

[Series 14: T1 dynamic · axial · 3.0mm · 1.31mm/px · z∈[-116,+121]mm · 3 of 80 slices shown (1 of 7)]
[im 1/80]
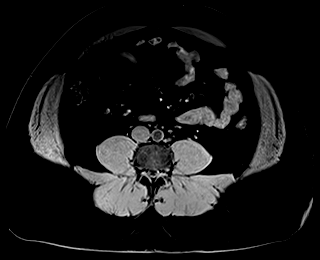
[im 40/80]
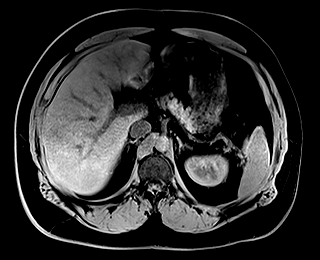
[im 80/80]
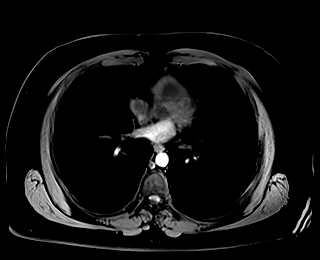

[Series 16: T1 dynamic · axial · 3.0mm · 1.31mm/px · z∈[-116,+121]mm · 3 of 80 slices shown (2 of 7)]
[im 1/80]
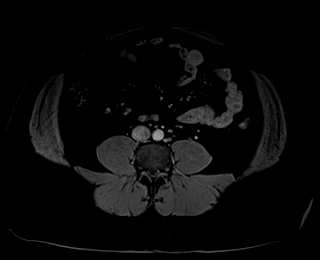
[im 40/80]
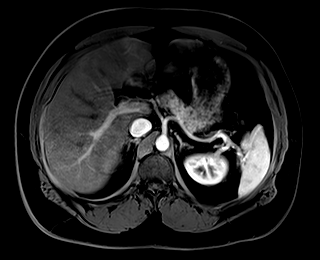
[im 80/80]
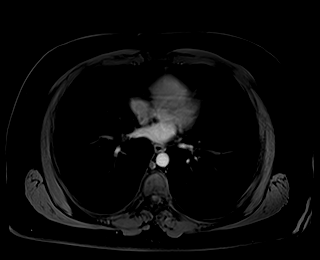

[Series 17: T1 dynamic · axial · 3.0mm · 1.31mm/px · z∈[-116,+121]mm · 3 of 80 slices shown (3 of 7)]
[im 1/80]
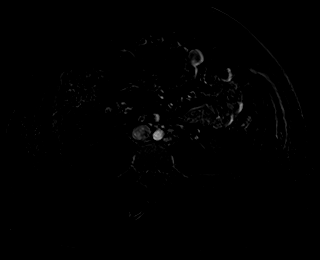
[im 40/80]
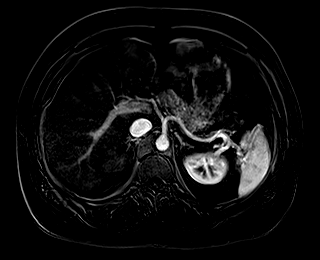
[im 80/80]
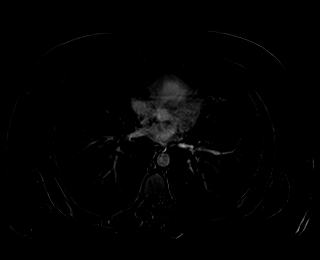

[Series 18: T1 dynamic · axial · 3.0mm · 1.31mm/px · z∈[-116,+121]mm · 3 of 80 slices shown (4 of 7)]
[im 1/80]
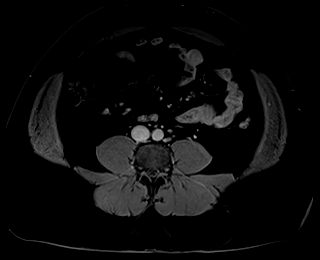
[im 40/80]
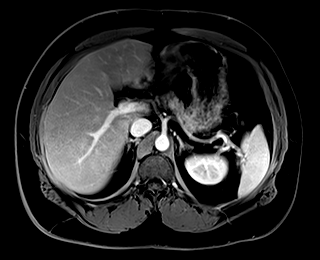
[im 80/80]
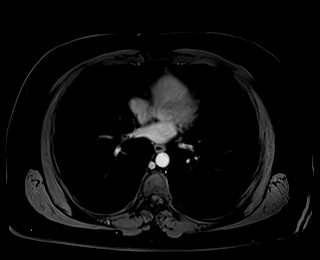

[Series 19: T1 dynamic · axial · 3.0mm · 1.31mm/px · z∈[-116,+121]mm · 3 of 80 slices shown (5 of 7)]
[im 1/80]
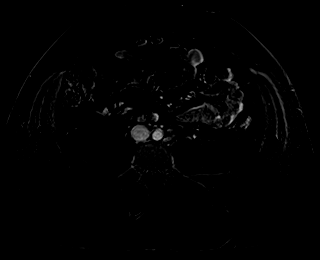
[im 40/80]
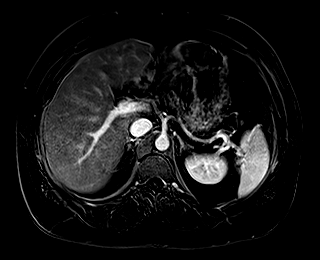
[im 80/80]
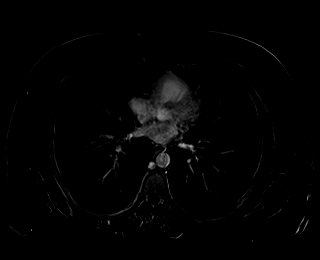

[Series 20: T1 dynamic · axial · 3.0mm · 1.31mm/px · z∈[-116,+121]mm · 3 of 80 slices shown (6 of 7)]
[im 1/80]
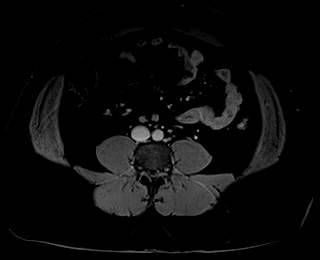
[im 40/80]
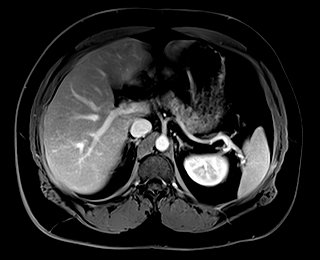
[im 80/80]
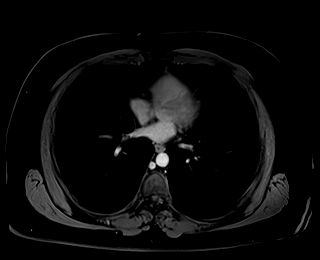

[Series 21: T1 dynamic · axial · 3.0mm · 1.31mm/px · z∈[-116,+121]mm · 3 of 80 slices shown (7 of 7)]
[im 1/80]
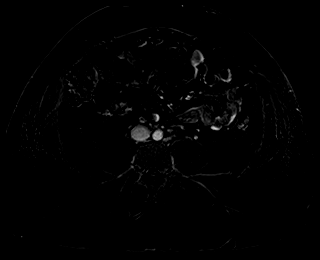
[im 40/80]
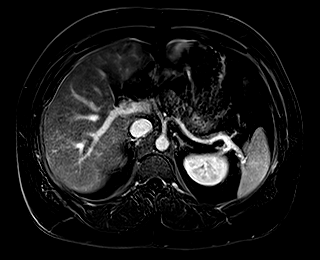
[im 80/80]
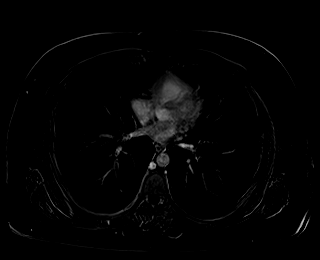

[Series 22: T1 dynamic post-contrast · coronal · 3.0mm · 1.31mm/px · 4 of 104 slices shown (1 of 3)]
[im 1/104]
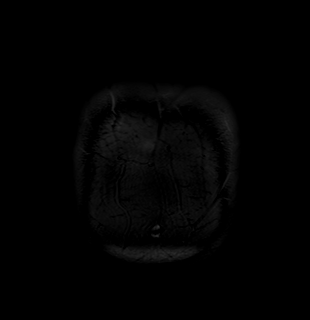
[im 35/104]
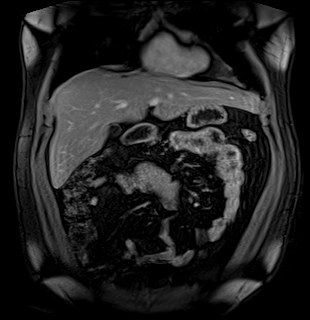
[im 69/104]
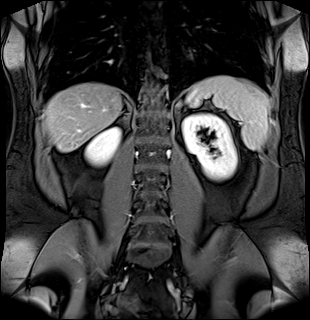
[im 104/104]
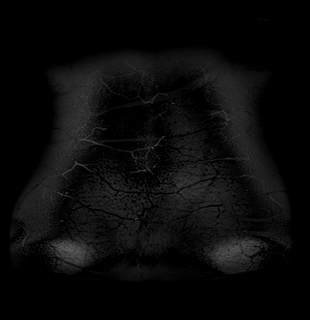

[Series 23: T1 dynamic post-contrast · axial · 3.0mm · 1.31mm/px · z∈[-116,+121]mm · 3 of 80 slices shown (2 of 3)]
[im 1/80]
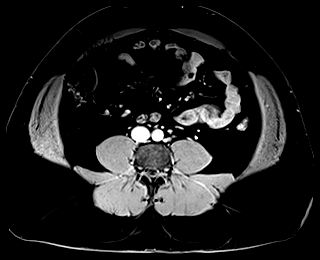
[im 40/80]
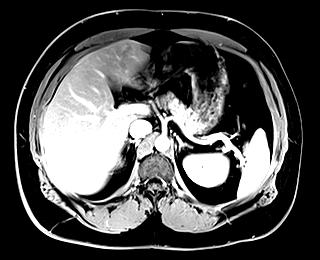
[im 80/80]
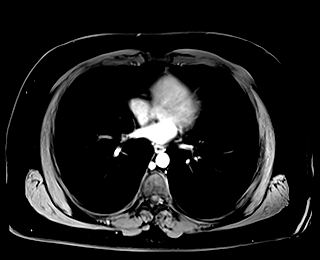

[Series 24: T1 dynamic post-contrast · axial · 3.0mm · 1.31mm/px · z∈[-116,+121]mm · 3 of 80 slices shown (3 of 3)]
[im 1/80]
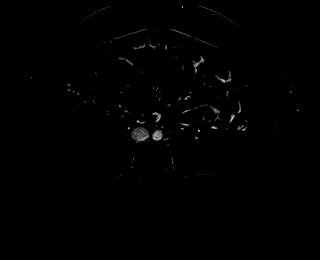
[im 40/80]
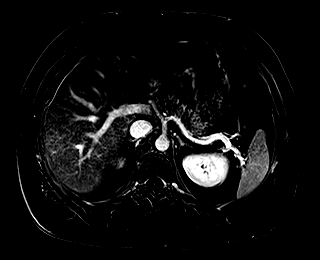
[im 80/80]
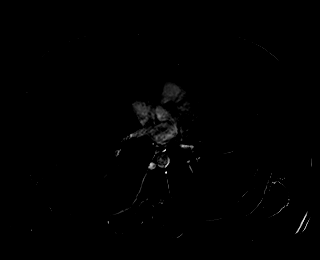

[45 of 48 positions shown; findings below may reference images not displayed]

FINDINGS: Despite efforts by the technologist and patient, motion artifact is
present on today's exam and could not be eliminated. This reduces
exam sensitivity and specificity.

Lower chest: Unremarkable

Hepatobiliary: 6 mm T2 hyperintense lesion in the dome of the right
hepatic lobe on image 22 series 3 demonstrates arterial phase
enhancement and delayed phase enhancement compatible with a flash
filling hemangioma. The liver appears otherwise unremarkable. No
biliary dilatation.

Pancreas: The region of concern on recent CT appears to correspond
to a tiny amount of fat signal intensity stippling in the pancreas
on today's MRI. There is no fluid signal intensity in this vicinity
nor is there abnormal parenchymal tissue identified. The appearance
on CT is thought to represent volume averaging of a small amount of
incidental fat stippling along the pancreatic head/uncinate process.
No worrisome pancreatic lesion identified.

Spleen:  Unremarkable

Adrenals/Urinary Tract:  Unremarkable

Stomach/Bowel: Unremarkable

Vascular/Lymphatic:  Unremarkable

Other:  No supplemental non-categorized findings.

Musculoskeletal: Degenerative disc disease at L5-S1.
IMPRESSION: 1. The area of concern on prior CT represents incidental benign fat
stippling along the pancreatic head. There is no cystic lesion or
lesion of concern and no further workup is needed.
2. Degenerative disc disease at L5-S1.
3. 6 mm benign hemangioma in the dome of the liver, this requires no
further workup.

## 2021-05-19 MED ORDER — GADOBUTROL 1 MMOL/ML IV SOLN
10.0000 mL | Freq: Once | INTRAVENOUS | Status: AC | PRN
  Administered 2021-05-19: 10 mL via INTRAVENOUS

## 2021-05-23 ENCOUNTER — Ambulatory Visit: Payer: 59 | Admitting: Nurse Practitioner

## 2021-05-23 ENCOUNTER — Encounter: Payer: Self-pay | Admitting: Nurse Practitioner

## 2021-06-06 ENCOUNTER — Encounter: Payer: Self-pay | Admitting: Nurse Practitioner

## 2021-06-06 ENCOUNTER — Ambulatory Visit (INDEPENDENT_AMBULATORY_CARE_PROVIDER_SITE_OTHER): Payer: Self-pay | Admitting: Nurse Practitioner

## 2021-06-06 VITALS — BP 118/75 | HR 80 | Temp 97.8°F | Resp 20 | Ht 68.0 in | Wt 234.0 lb

## 2021-06-06 DIAGNOSIS — Z029 Encounter for administrative examinations, unspecified: Secondary | ICD-10-CM

## 2021-06-06 NOTE — Progress Notes (Signed)
? ?Established Patient Office Visit ? ?Subjective   ?Patient ID: Nicholas Brooks, male    DOB: Oct 28, 1985  Age: 36 y.o. MRN: [id number] ? ?Chief Complaint  ?Patient presents with  ? Medical Management of Chronic Issues  ?  3 mo   ? ? ?HPI ? ? ?Patient is in clinic today with questions about his MRI and CT scan results.  I provided extensive education to patient on the details of his MRI and CT.  Patient verbalized understanding and knows to follow-up with any new symptoms.  Patient has no signs and symptoms of infection or medical complaints. ? ? ?Review of Systems  ?Constitutional: Negative.   ?HENT: Negative.    ?Eyes: Negative.   ?Respiratory: Negative.    ?Cardiovascular: Negative.   ?Skin: Negative.  Negative for rash.  ?Neurological: Negative.   ?Psychiatric/Behavioral: Negative.    ?All other systems reviewed and are negative. ? ?  ?Objective:  ?  ? ?BP 118/75   Pulse 80   Temp 97.8 ?F (36.6 ?C)   Resp 20   Ht '5\' 8"'  (1.727 m)   Wt 234 lb (106.1 kg)   SpO2 96%   BMI 35.58 kg/m?  ?BP Readings from Last 3 Encounters:  ?06/06/21 118/75  ?04/19/21 127/77  ?02/22/21 131/87  ? ?  ? ?Physical Exam ?Vitals and nursing note reviewed.  ?Constitutional:   ?   Appearance: Normal appearance.  ?HENT:  ?   Head: Normocephalic.  ?   Right Ear: External ear normal.  ?   Left Ear: External ear normal.  ?   Nose: Nose normal.  ?   Mouth/Throat:  ?   Mouth: Mucous membranes are moist.  ?   Pharynx: Oropharynx is clear.  ?Eyes:  ?   Conjunctiva/sclera: Conjunctivae normal.  ?Cardiovascular:  ?   Rate and Rhythm: Normal rate.  ?Pulmonary:  ?   Effort: Pulmonary effort is normal.  ?Abdominal:  ?   General: Bowel sounds are normal.  ?Skin: ?   General: Skin is warm.  ?   Findings: No rash.  ?Neurological:  ?   Mental Status: He is alert and oriented to person, place, and time.  ?Psychiatric:     ?   Behavior: Behavior normal.  ? ? ? ?No results found for any visits on 06/06/21. ? ?Last CBC ?Lab Results  ?Component Value Date  ?  WBC 9.5 04/19/2021  ? HGB 15.0 04/19/2021  ? HCT 43.6 04/19/2021  ? MCV 88 04/19/2021  ? MCH 30.2 04/19/2021  ? RDW 14.0 04/19/2021  ? PLT 298 04/19/2021  ? ?Last metabolic panel ?Lab Results  ?Component Value Date  ? GLUCOSE 100 (H) 04/19/2021  ? NA 140 04/19/2021  ? K 4.3 04/19/2021  ? CL 102 04/19/2021  ? CO2 24 04/19/2021  ? BUN 14 04/19/2021  ? CREATININE 0.91 04/19/2021  ? EGFR 114 04/19/2021  ? CALCIUM 9.0 04/19/2021  ? PROT 6.9 04/19/2021  ? ALBUMIN 4.2 04/19/2021  ? LABGLOB 2.7 04/19/2021  ? AGRATIO 1.6 04/19/2021  ? BILITOT <0.2 04/19/2021  ? ALKPHOS 92 04/19/2021  ? AST 16 04/19/2021  ? ALT 27 04/19/2021  ? ANIONGAP 6 05/24/2016  ? ?Last lipids ?Lab Results  ?Component Value Date  ? CHOL 176 04/19/2021  ? HDL 30 (L) 04/19/2021  ? LDLCALC 121 (H) 04/19/2021  ? TRIG 139 04/19/2021  ? CHOLHDL 5.9 (H) 04/19/2021  ? ?  ? ?The ASCVD Risk score (Arnett DK, et al., 2019) failed to calculate for  the following reasons: ?  The 2019 ASCVD risk score is only valid for ages 78 to 69 ? ?  ?Assessment & Plan:  ? ?Problem List Items Addressed This Visit   ?None ?Visit Diagnoses   ? ? Administrative encounter    -  Primary  ? ?  ? ? ?No follow-ups on file.  ? ? ?Ivy Lynn, NP ? ?

## 2022-02-15 ENCOUNTER — Emergency Department (HOSPITAL_BASED_OUTPATIENT_CLINIC_OR_DEPARTMENT_OTHER): Payer: Medicaid Other

## 2022-02-15 ENCOUNTER — Encounter (HOSPITAL_BASED_OUTPATIENT_CLINIC_OR_DEPARTMENT_OTHER): Payer: Self-pay

## 2022-02-15 ENCOUNTER — Other Ambulatory Visit: Payer: Self-pay

## 2022-02-15 ENCOUNTER — Emergency Department (HOSPITAL_BASED_OUTPATIENT_CLINIC_OR_DEPARTMENT_OTHER)
Admission: EM | Admit: 2022-02-15 | Discharge: 2022-02-15 | Disposition: A | Discharge status: Home / Self Care | Payer: Medicaid Other | Attending: Emergency Medicine | Admitting: Emergency Medicine

## 2022-02-15 DIAGNOSIS — S93692A Other sprain of left foot, initial encounter: Secondary | ICD-10-CM | POA: Diagnosis not present

## 2022-02-15 DIAGNOSIS — Y9301 Activity, walking, marching and hiking: Secondary | ICD-10-CM | POA: Diagnosis not present

## 2022-02-15 DIAGNOSIS — M79672 Pain in left foot: Secondary | ICD-10-CM

## 2022-02-15 DIAGNOSIS — X501XXA Overexertion from prolonged static or awkward postures, initial encounter: Secondary | ICD-10-CM | POA: Diagnosis not present

## 2022-02-15 DIAGNOSIS — S93602A Unspecified sprain of left foot, initial encounter: Secondary | ICD-10-CM

## 2022-02-15 NOTE — Discharge Instructions (Addendum)
Please follow-up with your primary care doctor, take Tylenol ibuprofen for the pain control.  If you start having changes in color of your foot, or unable to bear weight, or it becomes pulseless, please return to the ER.  Use the boot for extra support.

## 2022-02-15 NOTE — ED Triage Notes (Signed)
Pt c/o L foot pain x1 day.  Denies injury.  Pain score 9/10.  Pt noted to walk w/ a limp.

## 2022-02-15 NOTE — ED Provider Notes (Signed)
[facility] Provider Note   CSN: [id number] Arrival date & time: 02/15/22  1042     History  Chief Complaint  Patient presents with   Foot Pain    Nicholas Brooks is a 37 y.o. male, no pertinent past medical history, who presents to the ED secondary to left ankle/foot pain has been on for the past day.  States he is walking, and he felt like he twisted it.  Did not trip and fall, but states he feels like he twisted it.  Denies any trauma to the area no redness, swelling or rashes.  Denies any recent surgeries.  States that it hurts a lot to walk on, and that he can walk but is just very painful.    Home Medications Prior to Admission medications   Medication Sig Start Date End Date Taking? Authorizing Provider  albuterol (PROVENTIL HFA) 108 (90 Base) MCG/ACT inhaler INHALE ONE TO TWO PUFFS INTO LUNGS EVERY 4 HOURS AS NEEDED FOR WHEEZING, SHORTNESS OF BREATH OR COUGH 08/29/17   Jola Schmidt, MD  albuterol (PROVENTIL) (2.5 MG/3ML) 0.083% nebulizer solution USE ONE VIAL IN NEBULIZER EVERY 4 HOURS AS NEEDED FOR WHEEZING 08/29/17   Jola Schmidt, MD      Allergies    Patient has no known allergies.    Review of Systems   Review of Systems  Musculoskeletal:        +L foot pain    Physical Exam Updated Vital Signs BP 126/83   Pulse 90   Temp 97.9 F (36.6 C) (Oral)   Resp (!) 22   SpO2 96%  Physical Exam Vitals and nursing note reviewed.  Constitutional:      General: He is not in acute distress.    Appearance: He is well-developed.  HENT:     Head: Normocephalic and atraumatic.  Eyes:     General:        Right eye: No discharge.        Left eye: No discharge.     Conjunctiva/sclera: Conjunctivae normal.  Cardiovascular:     Pulses:          Dorsalis pedis pulses are 2+ on the right side and 2+ on the left side.  Pulmonary:     Effort: No respiratory distress.  Musculoskeletal:     Right lower leg: No edema.     Left lower leg: No  edema.     Comments: Left ankle/foot: No TTP of lateral/medial malleolus. TTP of navicular. No edema noted.. Able to bear weight. Able to plantar flex and dorsiflex ankle. Inversion/eversion intact. Negative Thompson test. No midfoot tor base of 5th metatarsal tenderness to palpation. Capillary refill <2sec. Dorsalis pedis pulse present. No foot drop noted. Sensation intact. Warm to touch.    Neurological:     Mental Status: He is alert.     Comments: Clear speech.   Psychiatric:        Behavior: Behavior normal.        Thought Content: Thought content normal.     ED Results / Procedures / Treatments   Labs (all labs ordered are listed, but only abnormal results are displayed) Labs Reviewed - No data to display  EKG None  Radiology DG Foot Complete Left  Result Date: 02/15/2022 CLINICAL DATA:  Inversion injury, pain. EXAM: LEFT FOOT - COMPLETE 3+ VIEW COMPARISON:  None Available. FINDINGS: No acute osseous or joint abnormality.  Dorsal calcaneal spur. IMPRESSION: No acute findings. Electronically Signed  By: Lorin Picket M.D.   On: 02/15/2022 11:42    Procedures Procedures    Medications Ordered in ED Medications - No data to display  ED Course/ Medical Decision Making/ A&P Clinical Course as of 02/15/22 1425  Wed Feb 15, 2022  1408 DG Foot Complete Left [BS]    Clinical Course User Index [BS] Phineas Mcenroe, Si Gaul, PA                           Medical Decision Making Patient is a 37 year old male, here for foot pain that is been going on for 1 day.  States that he thinks he twisted it the wrong way.  Range of motion is intact, it is painful to stand on is able to bear weight just is painful.  He has no overt swelling, he has tenderness to his navicular, there is no redness, swelling, or pulselessness of the foot.  Discussed with patient crutches versus boot, he would prefer boot.  Boot ordered.  X-ray obtained.  Amount and/or Complexity of Data Reviewed Radiology: ordered.  Decision-making details documented in ED Course.    Details: No fracture Discussion of management or test interpretation with external provider(s): Discussed with patient, negative x-ray, discussed likely sprain, follow-up with primary care doctor.  Given boot, offered crutches he declined.  Tylenol ibuprofen for pain control.  Work note provided.   Final Clinical Impression(s) / ED Diagnoses Final diagnoses:  Left foot pain  Sprain of left foot, initial encounter    Rx / DC Orders ED Discharge Orders     None         Osvaldo Shipper, PA 02/15/22 1425    Margette Fast, MD 02/16/22 1643

## 2022-02-15 NOTE — ED Notes (Signed)
Pt verbalized understanding of discharge instructions. Opportunity for questions provided.  

## 2022-10-28 ENCOUNTER — Emergency Department (HOSPITAL_BASED_OUTPATIENT_CLINIC_OR_DEPARTMENT_OTHER): Payer: Medicaid Other

## 2022-10-28 ENCOUNTER — Encounter (HOSPITAL_BASED_OUTPATIENT_CLINIC_OR_DEPARTMENT_OTHER): Payer: Self-pay | Admitting: Emergency Medicine

## 2022-10-28 ENCOUNTER — Emergency Department (HOSPITAL_BASED_OUTPATIENT_CLINIC_OR_DEPARTMENT_OTHER)
Admission: EM | Admit: 2022-10-28 | Discharge: 2022-10-28 | Disposition: A | Discharge status: Home / Self Care | Payer: Medicaid Other | Attending: Emergency Medicine | Admitting: Emergency Medicine

## 2022-10-28 ENCOUNTER — Other Ambulatory Visit: Payer: Self-pay

## 2022-10-28 DIAGNOSIS — F17210 Nicotine dependence, cigarettes, uncomplicated: Secondary | ICD-10-CM | POA: Insufficient documentation

## 2022-10-28 DIAGNOSIS — N5082 Scrotal pain: Secondary | ICD-10-CM | POA: Insufficient documentation

## 2022-10-28 MED ORDER — SULFAMETHOXAZOLE-TRIMETHOPRIM 800-160 MG PO TABS: 1.0000 | ORAL_TABLET | Freq: Two times a day (BID) | ORAL | 0 refills | Status: AC

## 2022-10-28 NOTE — Discharge Instructions (Signed)
As as discussed, recommend warm compresses of your scrotum at home.  Will place you on antibiotics in the form of Bactrim to take twice daily.  Recommend follow-up with urology in the outpatient setting for reassessment.  If you develop fever, worsening redness, worsening pain, please do not hesitate to return to emergency department.  Otherwise, recommend follow-up with urology.  Recommend Tylenol/Motrin for pain.

## 2022-10-28 NOTE — ED Provider Notes (Signed)
[facility] Provider Note   CSN: [id number] Arrival date & time: 10/28/22  1255     History  Chief Complaint  Patient presents with   Groin Pain    Nicholas Brooks is a 37 y.o. male.   Groin Pain   37 year old male presents emergency department with complaints of mass on scrotum.  Patient states that he has noticed area for the past month or so that has been intermittently growing and shrinking in size.  Reports some discomfort as area has enlarged over the last week or so.  Denies any drainage.  States it is a little tender to touch.  Denies any penile discharge, dysuria, testicular pain, abdominal pain, nausea, vomiting.  Denies history of similar symptoms in the past.  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 Authorizing Provider  sulfamethoxazole-trimethoprim (BACTRIM DS) 800-160 MG tablet Take 1 tablet by mouth 2 (two) times daily for 7 days. 10/28/22 11/04/22 Yes Sherian Maroon A, PA  albuterol (PROVENTIL HFA) 108 (90 Base) MCG/ACT inhaler INHALE ONE TO TWO PUFFS INTO LUNGS EVERY 4 HOURS AS NEEDED FOR WHEEZING, SHORTNESS OF BREATH OR COUGH 08/29/17   Azalia Bilis, MD  albuterol (PROVENTIL) (2.5 MG/3ML) 0.083% nebulizer solution USE ONE VIAL IN NEBULIZER EVERY 4 HOURS AS NEEDED FOR WHEEZING 08/29/17   Azalia Bilis, MD      Allergies    Patient has no known allergies.    Review of Systems   Review of Systems  All other systems reviewed and are negative.   Physical Exam Updated Vital Signs BP (!) 140/103   Pulse 92   Temp 98.4 F (36.9 C) (Oral)   Resp 20   Ht 5\' 8"  (1.727 m)   Wt 108.9 kg   SpO2 100%   BMI 36.49 kg/m  Physical Exam Vitals and nursing note reviewed.  Constitutional:      General: He is not in acute distress.    Appearance: He is well-developed.   HENT:     Head: Normocephalic and atraumatic.  Eyes:     Conjunctiva/sclera: Conjunctivae normal.  Cardiovascular:     Rate and Rhythm: Normal rate and regular rhythm.     Heart sounds: No murmur heard. Pulmonary:     Effort: Pulmonary effort is normal. No respiratory distress.     Breath sounds: Normal breath sounds.  Abdominal:     Palpations: Abdomen is soft.     Tenderness: There is no abdominal tenderness.  Genitourinary:    Comments: Patient was verbalizing testicles bilaterally.  Hemostatic reflex intact bilaterally.  No testicular tenderness or tenderness of epididymis. Patient with evidence of indurated area measuring 2 cm in diameter at the inferior midline of scrotum.  No obvious erythema or palpable fluctuance.  No palpable fluctuance or expressible drainage. Musculoskeletal:        General: No swelling.     Cervical back: Neck supple.  Skin:    General: Skin is warm and dry.     Capillary Refill: Capillary refill takes less than 2 seconds.  Neurological:     Mental Status: He is alert.  Psychiatric:        Mood and Affect: Mood normal.     ED Results / Procedures / Treatments   Labs (all labs ordered are listed, but only abnormal results are displayed) Labs Reviewed - No  data to display  EKG None  Radiology US SCROTUM W/DOPPLER  Result Date: 10/28/2022 CLINICAL DATA:  Pain.  Lump on scrotum near midline EXAM: SCROTAL ULTRASOUND DOPPLER ULTRASOUND OF THE TESTICLES TECHNIQUE: Complete ultrasound examination of the testicles, epididymis, and other scrotal structures was performed. Color and spectral Doppler ultrasound were also utilized to evaluate blood flow to the testicles. COMPARISON:  None Available. FINDINGS: Right testicle Measurements: 4.2 x 1.7 x 2.5 cm. No mass or microlithiasis visualized. Left testicle Measurements: 3.5 x 1.8 x 2.2 cm. No mass or microlithiasis visualized. Right epididymis:  Normal in size and appearance. Left epididymis:  Normal in size  and appearance. Hydrocele:  Small hydroceles. Varicocele:  Left-sided varicocele. Pulsed Doppler interrogation of both testes demonstrates normal low resistance arterial and venous waveforms bilaterally. In the area of concern in the midline is a ill-defined hypoechoic area measuring 13 x 7 mm by 9 mm. Marginal flow on color Doppler. This has subdermal. IMPRESSION: Left-sided varicocele.  Small hydroceles. In the area of concern is a ill-defined hypoechoic soft tissue focus measuring up to 13 mm. This has of uncertain etiology. This has a broad differential. Please correlate for any clinical signs of the mass lesion versus infection. Additional workup or short follow up after treatment could be performed. Electronically Signed   By: Karen Kays M.D.   On: 10/28/2022 13:55    Procedures Procedures    Medications Ordered in ED Medications - No data to display  ED Course/ Medical Decision Making/ A&P                                 Medical Decision Making Amount and/or Complexity of Data Reviewed Radiology: ordered.  Risk Prescription drug management.   This patient presents to the ED for concern of scrotal pain, this involves an extensive number of treatment options, and is a complaint that carries with it a high risk of complications and morbidity.  The differential diagnosis includes testicular torsion, epididymitis, cyst, varicocele, hydrocele, inguinal hernia, Fournier's gangrene   Co morbidities that complicate the patient evaluation  See HPI   Additional history obtained:  Additional history obtained from EMR External records from outside source obtained and reviewed including hospital records   Lab Tests:  N/a   Imaging Studies ordered:  I ordered imaging studies including scrotal ultrasound I independently visualized and interpreted imaging which showed left-sided varicoceles.  Small hydrocele.  Hypoechoic soft tissue up to 13mm I agree with the radiologist  interpretation  Cardiac Monitoring: / EKG:  The patient was maintained on a cardiac monitor.  I personally viewed and interpreted the cardiac monitored which showed an underlying rhythm of: Sinus rhythm   Consultations Obtained:  I requested consultation with attending physician Dr. Eloise Harman who ambulated with the patient was agreement treatment plan going forward   Problem List / ED Course / Critical interventions / Medication management  Scrotal pain Reevaluation of the patient showed that the patient stayed the same I have reviewed the patients home medicines and have made adjustments as needed   Social Determinants of Health:  Chronic cigarette use.  Denies illicit drug use.   Test / Admission - Considered:  Scrotal pain Vitals signs significant for hypertension blood pressure 140/103. Otherwise within normal range and stable throughout visit. Imaging studies significant for: See above 37 year old male presents emergency department with complaints of "bump on scrotum" over the past month or so becoming more symptomatic  over the past week.  On exam, patient with indurated area midline of scrotum without obvious warmth/fluctuance/erythema.  Ultrasound was performed which again showed hypoechoic area without definitive etiology.  Patient also incidentally with left-sided varicocele as well as small bilateral hydrocele which I do not think are causing symptoms currently.  Bedside ultrasound was performed on area of concern which showed no fluid collection with possible phlegmon.  Will place patient on empiric antibiotics, recommend warm compresses and follow-up with urology in the outpatient setting.  Treatment plan discussed at length with patient and he acknowledged understanding was agreeable to said plan.  Patient overall well-appearing, febrile no acute distress. Worrisome signs and symptoms were discussed with the patient, and the patient acknowledged understanding to return to  the ED if noticed. Patient was stable upon discharge.          Final Clinical Impression(s) / ED Diagnoses Final diagnoses:  Scrotal pain    Rx / DC Orders ED Discharge Orders          Ordered    sulfamethoxazole-trimethoprim (BACTRIM DS) 800-160 MG tablet  2 times daily        10/28/22 1420    Ambulatory referral to Urology       Comments: [facility]   10/28/22 1420              Peter Garter, [place] 10/28/22 1530    Rondel Baton, MD 10/29/22 [phone]

## 2022-10-28 NOTE — ED Triage Notes (Signed)
Pt has had a bump in his groin on his testicle sac x 1 week, it is getting bigger

## 2022-12-21 ENCOUNTER — Emergency Department (HOSPITAL_BASED_OUTPATIENT_CLINIC_OR_DEPARTMENT_OTHER): Payer: Self-pay

## 2022-12-21 ENCOUNTER — Other Ambulatory Visit: Payer: Self-pay

## 2022-12-21 ENCOUNTER — Emergency Department (HOSPITAL_BASED_OUTPATIENT_CLINIC_OR_DEPARTMENT_OTHER)
Admission: EM | Admit: 2022-12-21 | Discharge: 2022-12-21 | Disposition: A | Discharge status: Home / Self Care | Payer: Self-pay | Attending: Emergency Medicine | Admitting: Emergency Medicine

## 2022-12-21 ENCOUNTER — Encounter (HOSPITAL_BASED_OUTPATIENT_CLINIC_OR_DEPARTMENT_OTHER): Payer: Self-pay

## 2022-12-21 DIAGNOSIS — J069 Acute upper respiratory infection, unspecified: Secondary | ICD-10-CM | POA: Insufficient documentation

## 2022-12-21 DIAGNOSIS — Z20822 Contact with and (suspected) exposure to covid-19: Secondary | ICD-10-CM | POA: Insufficient documentation

## 2022-12-21 LAB — SARS CORONAVIRUS 2 BY RT PCR: SARS Coronavirus 2 by RT PCR: NEGATIVE

## 2022-12-21 MED ORDER — ALBUTEROL SULFATE HFA 108 (90 BASE) MCG/ACT IN AERS
2.0000 | INHALATION_SPRAY | Freq: Once | RESPIRATORY_TRACT | Status: AC
  Administered 2022-12-21: 2 via RESPIRATORY_TRACT
  Filled 2022-12-21: qty 6.7

## 2022-12-21 MED ORDER — ALBUTEROL SULFATE (5 MG/ML) 0.5% IN NEBU: 2.5000 mg | INHALATION_SOLUTION | Freq: Four times a day (QID) | RESPIRATORY_TRACT | 12 refills | Status: DC | PRN

## 2022-12-21 MED ORDER — AEROCHAMBER PLUS FLO-VU MISC
1.0000 | Freq: Once | Status: AC
  Administered 2022-12-21: 1
  Filled 2022-12-21: qty 1

## 2022-12-21 MED ORDER — METHYLPREDNISOLONE 4 MG PO TBPK: ORAL_TABLET | ORAL | 0 refills | Status: DC

## 2022-12-21 NOTE — ED Triage Notes (Signed)
Also requests albuterol inhaler and nebulizer refills.

## 2022-12-21 NOTE — ED Provider Notes (Signed)
[place] [facility] Provider Note   CSN: [id number] Arrival date & time: 12/21/22  1003     History  Chief Complaint  Patient presents with   Cough    Nicholas Brooks is a 37 y.o. male presents emergency department with URI symptoms, wheezing and cough.  He is a daily smoker.  Patient reports history of asthma.  He developed a URI about a week ago.  He has had to use his nebulizer and albuterol inhaler multiple times and has not had significant improvement.  Patient states he is having difficulty sleeping due to coughing and wheezing.  He does not feel like he is having significant work of breathing at this moment.  He is out of his inhaler.  Fevers or chills but is having a productive cough.   Cough      Home Medications Prior to Admission medications   Medication Sig Start Date End Date Taking? Authorizing Provider  albuterol (PROVENTIL HFA) 108 (90 Base) MCG/ACT inhaler INHALE ONE TO TWO PUFFS INTO LUNGS EVERY 4 HOURS AS NEEDED FOR WHEEZING, SHORTNESS OF BREATH OR COUGH 08/29/17   Azalia Bilis, MD  albuterol (PROVENTIL) (2.5 MG/3ML) 0.083% nebulizer solution USE ONE VIAL IN NEBULIZER EVERY 4 HOURS AS NEEDED FOR WHEEZING 08/29/17   Azalia Bilis, MD      Allergies    Patient has no known allergies.    Review of Systems   Review of Systems  Respiratory:  Positive for cough.     Physical Exam Updated Vital Signs BP (!) 141/84 (BP Location: Left Arm)   Pulse 87   Temp 98 F (36.7 C) (Oral)   Resp 18   Ht 5\' 8"  (1.727 m)   Wt 99.8 kg   SpO2 95%   BMI 33.45 kg/m  Physical Exam Vitals and nursing note reviewed.  Constitutional:      General: He is not in acute distress.    Appearance: He is well-developed. He is not diaphoretic.  HENT:     Head: Normocephalic and atraumatic.  Eyes:     General: No scleral icterus.    Conjunctiva/sclera: Conjunctivae normal.  Cardiovascular:     Rate and Rhythm: Normal rate and regular  rhythm.     Heart sounds: Normal heart sounds.  Pulmonary:     Effort: Pulmonary effort is normal. No respiratory distress.     Breath sounds: Normal breath sounds.  Abdominal:     Palpations: Abdomen is soft.     Tenderness: There is no abdominal tenderness.  Musculoskeletal:     Cervical back: Normal range of motion and neck supple.  Skin:    General: Skin is warm and dry.  Neurological:     Mental Status: He is alert.  Psychiatric:        Behavior: Behavior normal.     ED Results / Procedures / Treatments   Labs (all labs ordered are listed, but only abnormal results are displayed) Labs Reviewed  SARS CORONAVIRUS 2 BY RT PCR    EKG None  Radiology No results found.  Procedures Procedures    Medications Ordered in ED Medications - No data to display  ED Course/ Medical Decision Making/ A&P                                 Medical Decision Making Patient here with uri. I personally visualized and interpreted the images using our  PACS system. Acute findings include:  Cxr negative Negative covid, Will refill albuterol nebs and inhaler   The patient was counseled on the dangers of tobacco use, and was advised to quit. Reviewed strategies to maximize success, including removing cigarettes and smoking materials from environment, stress management, substitution of other forms of reinforcement, support of family/friends and written materials.     Amount and/or Complexity of Data Reviewed Radiology: ordered and independent interpretation performed.  Risk Prescription drug management.           Final Clinical Impression(s) / ED Diagnoses Final diagnoses:  None    Rx / DC Orders ED Discharge Orders     None         Arthor Captain, PA-C 12/22/22 1445    Glyn Ade, MD 12/22/22 1600

## 2022-12-21 NOTE — ED Notes (Signed)

## 2022-12-21 NOTE — ED Triage Notes (Signed)
In for eval of productive cough with yellow sputum and SOB onset 1-1.5 weeks ago. Denies headache, chills, or N/V. Has taken OTC congestion medication and delsym.

## 2022-12-21 NOTE — Discharge Instructions (Addendum)
Get help right away if: You have shortness of breath that gets worse. You have severe or persistent: Headache. Ear pain. Sinus pain. Chest pain. You have chronic lung disease along with any of the following: Making high-pitched whistling sounds when you breathe, most often when you breathe out (wheezing). Prolonged cough (more than 14 days). Coughing up blood. A change in your usual mucus. You have a stiff neck. You have changes in your: Vision. Hearing. Thinking. Mood. These symptoms may be an emergency. Get help right away. Call 911. Do not wait to see if the symptoms will go away. Do not drive yourself to the hospital

## 2023-02-09 ENCOUNTER — Other Ambulatory Visit: Payer: Self-pay

## 2023-02-09 ENCOUNTER — Emergency Department (HOSPITAL_BASED_OUTPATIENT_CLINIC_OR_DEPARTMENT_OTHER)
Admission: EM | Admit: 2023-02-09 | Discharge: 2023-02-09 | Disposition: A | Discharge status: Home / Self Care | Payer: Self-pay | Attending: Emergency Medicine | Admitting: Emergency Medicine

## 2023-02-09 ENCOUNTER — Encounter (HOSPITAL_BASED_OUTPATIENT_CLINIC_OR_DEPARTMENT_OTHER): Payer: Self-pay

## 2023-02-09 DIAGNOSIS — J45909 Unspecified asthma, uncomplicated: Secondary | ICD-10-CM | POA: Insufficient documentation

## 2023-02-09 DIAGNOSIS — Z7951 Long term (current) use of inhaled steroids: Secondary | ICD-10-CM | POA: Insufficient documentation

## 2023-02-09 DIAGNOSIS — Z20822 Contact with and (suspected) exposure to covid-19: Secondary | ICD-10-CM | POA: Insufficient documentation

## 2023-02-09 DIAGNOSIS — J069 Acute upper respiratory infection, unspecified: Secondary | ICD-10-CM | POA: Insufficient documentation

## 2023-02-09 DIAGNOSIS — Z7952 Long term (current) use of systemic steroids: Secondary | ICD-10-CM | POA: Insufficient documentation

## 2023-02-09 LAB — RESP PANEL BY RT-PCR (RSV, FLU A&B, COVID)  RVPGX2
Influenza A by PCR: NEGATIVE
Influenza B by PCR: NEGATIVE
Resp Syncytial Virus by PCR: NEGATIVE
SARS Coronavirus 2 by RT PCR: NEGATIVE

## 2023-02-09 NOTE — ED Provider Notes (Cosign Needed)
[place] [facility] Provider Note   CSN: [id number] Arrival date & time: 02/09/23  1258     History  Chief Complaint  Patient presents with   Cough    Nicholas Brooks is a 37 y.o. 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  Patient reports he has not tried anything at home for symptoms.  Denies any fever or chills.  She reports that he did not go to work today and is requesting work note.  Denies any other associated symptoms.  Denies chest pain, shortness of breath, blood in urine or stool.  No recent travel and no known recent sick exposure.   Cough      Home Medications Prior to Admission medications   Medication Sig Start Date End Date Taking? Authorizing Provider  albuterol (PROVENTIL HFA) 108 (90 Base) MCG/ACT inhaler INHALE ONE TO TWO PUFFS INTO LUNGS EVERY 4 HOURS AS NEEDED FOR WHEEZING, SHORTNESS OF BREATH OR COUGH 08/29/17   Azalia Bilis, MD  albuterol (PROVENTIL) (2.5 MG/3ML) 0.083% nebulizer solution USE ONE VIAL IN NEBULIZER EVERY 4 HOURS AS NEEDED FOR WHEEZING 08/29/17   Azalia Bilis, MD  albuterol (PROVENTIL) (5 MG/ML) 0.5% nebulizer solution Take 0.5 mLs (2.5 mg total) by nebulization every 6 (six) hours as needed for wheezing or shortness of breath. 12/21/22   Arthor Captain, PA-C  methylPREDNISolone (MEDROL DOSEPAK) 4 MG TBPK tablet Use as directed 12/21/22   Arthor Captain, PA-C      Allergies    Patient has no known allergies.    Review of Systems   Review of Systems  Respiratory:  Positive for cough.     Physical Exam Updated Vital Signs BP (!) 135/100   Pulse (!) 115   Temp 98.1 F (36.7 C) (Oral)   Resp 16   SpO2 97%  Physical Exam Vitals and nursing note reviewed.  Constitutional:      General: He is not in acute distress.    Appearance: He is not  toxic-appearing.  HENT:     Head: Normocephalic and atraumatic.     Right Ear: There is no impacted cerumen.     Left Ear: There is no impacted cerumen.     Nose: Congestion and rhinorrhea present.     Mouth/Throat:     Pharynx: No oropharyngeal exudate or posterior oropharyngeal erythema.  Eyes:     General: No scleral icterus.    Conjunctiva/sclera: Conjunctivae normal.  Cardiovascular:     Rate and Rhythm: Normal rate and regular rhythm.     Pulses: Normal pulses.     Heart sounds: Normal heart sounds.  Pulmonary:     Effort: Pulmonary effort is normal. No respiratory distress.     Breath sounds: Normal breath sounds. No stridor. No wheezing or rhonchi.  Abdominal:     General: Abdomen is flat. Bowel sounds are normal.     Palpations: Abdomen is soft.     Tenderness: There is no abdominal tenderness.  Skin:    General: Skin is warm and dry.     Findings: No lesion.  Neurological:     General: No focal deficit present.     Mental Status: He is alert and oriented to person, place, and time. Mental status is at baseline.     ED Results / Procedures / Treatments   Labs (all  labs ordered are listed, but only abnormal results are displayed) Labs Reviewed  RESP PANEL BY RT-PCR (RSV, FLU A&B, COVID)  RVPGX2    EKG None  Radiology No results found.  Procedures Procedures    Medications Ordered in ED Medications - No data to display  ED Course/ Medical Decision Making/ A&P                                 Medical Decision Making  Nicholas Brooks 37 y.o. presented today for URI like symptoms. Working DDx that I considered at this time includes, but not limited to, viral illness, pharyngitis, mono, sinusitis, electrolyte abnormality, AOM.  R/o DDx: these additional diagnoses are not consistent with patient's history, presentation, physical exam, labs/imaging findings.  Review of prior external notes: None  Labs:  Respiratory Panel: Neg  Imaging:  Considered  however patient is not having chest pain no fever no shortness of breath.  He is only reporting dry of cough which I believe is related to viral illness.  Problem List / ED Course / Critical interventions / Medication management  Patient reporting to the emergency room muscle aches and URI-like symptoms.  Patient reports his symptoms are mild and not associated with chest pain or shortness of breath.  Patient is hemodynamically stable well-appearing.  Lungs are clear to auscultation bilaterally no signs of respiratory distress.  Patient's respiratory swab is negative today.  Encourage patient to follow-up with primary care to ensure symptoms are improving.  Discussed over-the-counter management of symptoms.  Patient and member in room agreed to plan. Offered medication however patient declined. Patients vitals assessed. Upon arrival patient is hemodynamically stable.  I have reviewed the patients home medicines and have made adjustments as needed     Plan:  F/u w/ PCP in 2-3d to ensure resolution of sx.  Patient was given return precautions. Patient stable for discharge at this time.  Patient educated on sx and dx and verbalized understanding of plan. Return to ER if new or worsening sx.          Final Clinical Impression(s) / ED Diagnoses Final diagnoses:  Viral URI with cough    Rx / DC Orders ED Discharge Orders     None         Smitty Knudsen, PA-C 02/09/23 1722

## 2023-02-09 NOTE — Discharge Instructions (Signed)
You are seen in the emergency room today for cough.  Because you have asthma make sure you are monitoring your symptoms and using albuterol inhaler if you have shortness of breath or wheezing.  Please follow-up with primary care and 3 to 5 days to ensure resolution of symptoms.  Make sure staying well-hydrated you can use over-the-counter medications like Claritin or Zyrtec.  You can also use cold and flu medication, cough drops, warm tea with lemon and honey.  Please return to emergency room if you have any new or worsening symptoms.

## 2023-02-09 NOTE — ED Notes (Signed)

## 2023-02-09 NOTE — ED Triage Notes (Signed)
Pt c/o cough, headache, diarrhea, stuffed up nose and sore throat.

## 2023-09-23 ENCOUNTER — Emergency Department (HOSPITAL_BASED_OUTPATIENT_CLINIC_OR_DEPARTMENT_OTHER): Payer: Self-pay

## 2023-09-23 ENCOUNTER — Emergency Department (HOSPITAL_BASED_OUTPATIENT_CLINIC_OR_DEPARTMENT_OTHER)
Admission: EM | Admit: 2023-09-23 | Discharge: 2023-09-23 | Disposition: A | Discharge status: Home / Self Care | Payer: Self-pay | Attending: Emergency Medicine | Admitting: Emergency Medicine

## 2023-09-23 ENCOUNTER — Encounter (HOSPITAL_BASED_OUTPATIENT_CLINIC_OR_DEPARTMENT_OTHER): Payer: Self-pay | Admitting: Emergency Medicine

## 2023-09-23 ENCOUNTER — Other Ambulatory Visit: Payer: Self-pay

## 2023-09-23 DIAGNOSIS — J4521 Mild intermittent asthma with (acute) exacerbation: Secondary | ICD-10-CM | POA: Insufficient documentation

## 2023-09-23 DIAGNOSIS — U071 COVID-19: Secondary | ICD-10-CM | POA: Insufficient documentation

## 2023-09-23 DIAGNOSIS — Z7951 Long term (current) use of inhaled steroids: Secondary | ICD-10-CM | POA: Insufficient documentation

## 2023-09-23 DIAGNOSIS — Z7952 Long term (current) use of systemic steroids: Secondary | ICD-10-CM | POA: Insufficient documentation

## 2023-09-23 LAB — RESP PANEL BY RT-PCR (RSV, FLU A&B, COVID)  RVPGX2
Influenza A by PCR: NEGATIVE
Influenza B by PCR: NEGATIVE
Resp Syncytial Virus by PCR: NEGATIVE
SARS Coronavirus 2 by RT PCR: POSITIVE — AB

## 2023-09-23 MED ORDER — ALBUTEROL SULFATE (2.5 MG/3ML) 0.083% IN NEBU: INHALATION_SOLUTION | RESPIRATORY_TRACT | 0 refills | Status: AC

## 2023-09-23 MED ORDER — ALBUTEROL SULFATE (2.5 MG/3ML) 0.083% IN NEBU: INHALATION_SOLUTION | RESPIRATORY_TRACT | 0 refills | Status: DC

## 2023-09-23 MED ORDER — PREDNISONE 50 MG PO TABS: 50.0000 mg | ORAL_TABLET | Freq: Every day | ORAL | 0 refills | Status: AC

## 2023-09-23 MED ORDER — ALBUTEROL SULFATE (5 MG/ML) 0.5% IN NEBU
2.5000 mg | INHALATION_SOLUTION | Freq: Four times a day (QID) | RESPIRATORY_TRACT | 12 refills | Status: AC | PRN
Start: 2023-09-23 — End: ?

## 2023-09-23 NOTE — ED Triage Notes (Addendum)
 Pt reports he tested positive at home for COVID, c/o cough, body aches, and congestion; would like confirmation and a work note; reports hx of asthma  RT called to triage to assess

## 2023-09-23 NOTE — ED Provider Notes (Signed)
 [place] [facility] Provider Note   CSN: [id number] Arrival date & time: 09/23/23  1950     Patient presents with: Cough   Nicholas Brooks is a 38 y.o. male here for evaluation of feeling unwell.  Friday developed cough, congestion, rhinorrhea, fever.  Has history of asthma.  Has had some mild wheeze been using his inhalers at home.  Here for work note.  No chest pain, shortness of breath, emesis, weakness took a home COVID test which was positive came here for confirmation as his test was expired.  No pain or swelling to lower legs   HPI     Prior to Admission medications   Medication Sig Start Date End Date Taking? Authorizing Provider  predniSONE  (DELTASONE ) 50 MG tablet Take 1 tablet (50 mg total) by mouth daily for 5 days. 09/23/23 09/28/23 Yes Alaa Mullally A, PA-C  albuterol  (PROVENTIL  HFA) 108 (90 Base) MCG/ACT inhaler INHALE ONE TO TWO PUFFS INTO LUNGS EVERY 4 HOURS AS NEEDED FOR WHEEZING, SHORTNESS OF BREATH OR COUGH 08/29/17   Baxter Drivers, MD  albuterol  (PROVENTIL ) (2.5 MG/3ML) 0.083% nebulizer solution USE ONE VIAL IN NEBULIZER EVERY 4 HOURS AS NEEDED FOR WHEEZING 09/23/23   Dula Havlik A, PA-C  albuterol  (PROVENTIL ) (5 MG/ML) 0.5% nebulizer solution Take 0.5 mLs (2.5 mg total) by nebulization every 6 (six) hours as needed for wheezing or shortness of breath. 09/23/23   Faryn Sieg A, PA-C    Allergies: Patient has no known allergies.    Review of Systems  Constitutional:  Positive for activity change, appetite change, fatigue and fever.  HENT:  Positive for congestion and sinus pressure. Negative for sore throat.   Respiratory:  Positive for cough and wheezing.   Cardiovascular: Negative.   Gastrointestinal: Negative.   Genitourinary: Negative.   Musculoskeletal:  Positive for myalgias.  Skin: Negative.   Neurological:  Positive for weakness.  All other systems reviewed and are negative.   Updated Vital Signs BP  130/76 (BP Location: Right Arm)   Pulse (!) 106   Temp 99.6 F (37.6 C) (Oral)   Resp 20   Ht 5' 8 (1.727 m)   Wt 113.4 kg   SpO2 98%   BMI 38.01 kg/m   Physical Exam Vitals and nursing note reviewed.  Constitutional:      General: He is not in acute distress.    Appearance: He is well-developed. He is not ill-appearing, toxic-appearing or diaphoretic.  HENT:     Head: Normocephalic and atraumatic.     Nose: Congestion and rhinorrhea present.     Mouth/Throat:     Mouth: Mucous membranes are moist.  Eyes:     Pupils: Pupils are equal, round, and reactive to light.  Cardiovascular:     Rate and Rhythm: Normal rate and regular rhythm.     Pulses: Normal pulses.     Heart sounds: Normal heart sounds.  Pulmonary:     Effort: Pulmonary effort is normal. No respiratory distress.     Breath sounds: Wheezing present.     Comments: Minimal expiratory wheeze.  Speaks without difficult Abdominal:     General: There is no distension.     Palpations: Abdomen is soft.     Tenderness: There is no abdominal tenderness. There is no guarding or rebound.  Musculoskeletal:        General: No swelling. Normal range of motion.     Cervical back: Normal range of motion and neck supple.  Skin:    General: Skin is warm and dry.  Neurological:     General: No focal deficit present.     Mental Status: He is alert and oriented to person, place, and time.     Cranial Nerves: No cranial nerve deficit.     Motor: No weakness.     Gait: Gait normal.     (all labs ordered are listed, but only abnormal results are displayed) Labs Reviewed  RESP PANEL BY RT-PCR (RSV, FLU A&B, COVID)  RVPGX2 - Abnormal; Notable for the following components:      Result Value   SARS Coronavirus 2 by RT PCR POSITIVE (*)    All other components within normal limits    EKG: None  Radiology: DG Chest 2 View Result Date: 09/23/2023 CLINICAL DATA:  Shortness of breath EXAM: CHEST - 2 VIEW COMPARISON:  None  Available. FINDINGS: The heart size and mediastinal contours are within normal limits. Both lungs are clear. The visualized skeletal structures are unremarkable. IMPRESSION: No active cardiopulmonary disease. Electronically Signed   By: Greig Pique M.D.   On: 09/23/2023 21:14     Procedures   Medications Ordered in the ED - No data to display  38 year old here for evaluation URI symptoms.  Friday, 2 days PTA developed congestion, rhinorrhea, cough, wheeze and fever.  Tested positive on an at home COVID test.  Heart rate 100 in room.  Temp 100.3.  He does not appear septic.  Does have some mild expiratory wheeze, however speaks in full sentences without difficulty.  He has nebulizers at home..  Given asthma will write for steroid course.   Labs and imaging personally viewed and interpreted:  X-ray without infiltrates, cardiomegaly, pulmonary edema, pneumothorax COVID test positive  Discussed results with patient.  No hypoxia, respiratory rate 20 in room.  Shared decision making Paxlovid.  Will have him follow-up outpatient, return for any worsening symptoms.  Do not feel he needs additional labs or imaging at this time.  Low suspicion for acute secondary bacterial infection, pulm edema, pneumothorax, sepsis, myocarditis, pericarditis, endocarditis, respiratory distress.   The patient has been appropriately medically screened and/or stabilized in the ED. I have low suspicion for any other emergent medical condition which would require further screening, evaluation or treatment in the ED or require inpatient management.  Patient is hemodynamically stable and in no acute distress.  Patient able to ambulate in department prior to ED.  Evaluation does not show acute pathology that would require ongoing or additional emergent interventions while in the emergency department or further inpatient treatment.  I have discussed the diagnosis with the patient and answered all questions.  Pain is been  managed while in the emergency department and patient has no further complaints prior to discharge.  Patient is comfortable with plan discussed in room and is stable for discharge at this time.  I have discussed strict return precautions for returning to the emergency department.  Patient was encouraged to follow-up with PCP/specialist refer to at discharge.                                     Medical Decision Making Amount and/or Complexity of Data Reviewed External Data Reviewed: labs, radiology and notes. Labs: ordered. Decision-making details documented in ED Course. Radiology: ordered and independent interpretation performed. Decision-making details documented in ED Course.  Risk OTC drugs. Prescription drug management. Parenteral controlled substances. Decision  regarding hospitalization. Diagnosis or treatment significantly limited by social determinants of health.       Final diagnoses:  COVID  Mild intermittent asthma with exacerbation    ED Discharge Orders          Ordered    predniSONE  (DELTASONE ) 50 MG tablet  Daily        09/23/23 2132    albuterol  (PROVENTIL ) (2.5 MG/3ML) 0.083% nebulizer solution  Status:  Discontinued       Note to Pharmacy: J44.9   09/23/23 2133    albuterol  (PROVENTIL ) (5 MG/ML) 0.5% nebulizer solution  Every 6 hours PRN        09/23/23 2133    albuterol  (PROVENTIL ) (2.5 MG/3ML) 0.083% nebulizer solution       Note to Pharmacy: J44.9   09/23/23 2134               Jennae Hakeem A, PA-C 09/23/23 2214    Mannie Pac T, DO 09/26/23 [phone]

## 2023-09-23 NOTE — ED Notes (Signed)
 Called to triage to assess patient with Covid. Patient BBS = course crackles. Patient has Hx of asthma. Patient taking breathing treatments with Albuterol . Patient not on maintenance medications. HR 109/RR 20/Ox sat 96%

## 2023-09-23 NOTE — Discharge Instructions (Signed)
 It was a pleasure taking care of you here today  Your COVID test was positive  You also had some mild wheezing  We have prescribed you steroids and sent in a refill of your inhaler  Follow-up with a primary care provider, return for any worsening symptoms

## 2024-01-30 NOTE — Progress Notes (Signed)
 [facility] Provider: CHRISTELLA Louder 01/30/2024 Subjective:  Chief Complaint  Patient presents with   Annual Exam   History of Present Illness:  Nicholas Brooks is a 38 y.o. male with past medical history as listed below presenting today for a physical. Patient is not fasting today.   Patient history, meds, immunizations, routine health maintenance, and problem list have been reviewed and discussed. Patient given opportunity to voice concerns.  Exercise and Diet: No regular exercise or diet regimen  BMI: Body mass index is 34.67 kg/m.  Sexually active/concerns for STD: No concerns  PSA screening: not indicated Colon CA screening: not indicated. However, colon cancer noted in maternal grandmother Smoking and ETOH use: he does not drink but continues to smoke daily  Immunizations: declined tetanus vaccination. He reports he received it <1 year ago.  Dental: Patient does see his dentist every 6 months. Dermatology: Patient does not have a dermatologist.  Denying skin concerns today. Ophthalmology:  He is followed by ophthalmology at this time. No vision concerns today. He does wear corrective lenses.   Chronic illnesses being managed and being addressed today:  History of Present Illness Asthma/tobacco use  He uses an albuterol  inhaler and neb as needed. He does not follow with pulmonology. He used his inhaler this morning due to wheezing.  No shortness of breath today.  He does continue to smoke.  He has smoked 1 pack/day since he was 38 years old.  He is not willing to quit at this time.   Witnessed apnea Wife reported witnessed apnea and snoring. Suspects OSA and wants to be referred for sleep studies   Psoriasis  Psoriasis is present on the elbows, knees, and ankles. Topical triamcinolone  cream has been beneficial   Abdominal muscle spasms Last visit patient reported muscle tightness in the stomach, particularly when laughing hard or straining at work, leading to cramps  that can last up to 20 to 30 minutes.  They occur in the middle of his abdomen on both sides of his umbilicus.  These episodes are often accompanied by coughing to relieve pressure. There are no associated symptoms such as nausea, vomiting, constipation, or diarrhea. This issue has been ongoing since age 16. Prescribed muscle relaxer but he has not had an episode since our last visit therefore he is unsure if the medication would be beneficial    Depression Screening       10/31/2023  Depression Screen  Please choose the category that best describes the patient's current state 0  Not eligible on the basis of Not applicable  1. Little interest or pleasure in doing things 0  2. Feeling down, depressed, or hopeless 0  PHQ-2 Total Score 0  PHQ-2 Interpretation of Score for Depression (Payor) Negative     Health Maintenance  Topic Date Due   DTaP/Tdap/Td Vaccines (7 - Td or Tdap) 05/24/2022   COVID-19 Vaccine (3 - 2025-26 season) 10/15/2023   Influenza Vaccine (1) 08/12/2024 (Originally 10/15/2023)   Pneumococcal Vaccine: Pediatrics and At Risk Patients (1 of 2 - PCV) 10/30/2024 (Originally 06/28/2006)   Adult Wellness Exam  01/29/2025   Zoster Vaccine (1 of 2) 06/27/2037   HPV Vaccine (No Doses Required) Completed   Meningococcal B Vaccine  Aged Out   Hepatitis A Vaccine  Aged Out   Meningococcal Conjugate Vaccine  Aged Out    Review of Systems Per history of present illness, otherwise negative.  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   Family history of congenital or genetic condition 05/11/2015   Obesity (BMI 30.0-34.9) 06/19/2016   Psoriasis 06/19/2016   Tobacco abuse 10/31/2023   Resolved Ambulatory Problems    Diagnosis Date Noted   No Resolved Ambulatory Problems   No Additional Past Medical History   Medications Ordered Prior to Encounter[1] Allergies[2] Family  History[3] Past Surgical History:  Procedure Laterality Date   Mandible fracture surgery      Social History[4]   Objective:  Physical Examination:  BP 124/83 (BP Location: Left Upper Arm, Patient Position: Sitting)   Pulse 86   Ht 5' 8 (1.727 m)   Wt 228 lb (103.4 kg)   SpO2 96%   BMI 34.67 kg/m  Vitals reviewed   Gen: Patient is obese and is in no acute distress. VS have been reviewed.  HEENT: Normocephalic.  Atraumatic.  EOMI, PERRLA, no scleral icterus or conjunctival pallor. Bilateral external auditory canal clear with minimal cerumen. Bilateral TM without bulging or erythema. Nasal turbinates pink without edema. Oropharynx clear without ulcerations, erythema, or tonsillar exudate.  Poor dentition  Neck: Supple without thyromegaly or lymphadenopathy. Normal ROM. No carotid bruits bilaterally.  Heart: Regular rate and rhythm without murmur, rubs or gallops. Normal S1 and S2.  Respiratory: Lungs are clear to auscultation bilaterally without rales/wheezes/rhonchi. No increased work of breathing or accessory muscle use.  Abdominal: round, no rashes present with normoactive bowel sounds, without tenderness.No obvious hepatosplenomegaly or masses. Tympanic to percussion.  Extremities: Full range of motion.  No clubbing, cyanosis or lower extremity edema.  Dorsalis pedis/posterior tibial pulses 2+ Neuro: A&Ox 3. Strength 5/5 bilaterally in upper and lower extremities.  Sensation intact bilaterally in upper and lower extremities.  Gait normal.  Psych: Pleasant and cooperative. Well dressed and mannered. Good eye contact. Good thought and speech content.  Skin: Dry scaly patches present on bilateral elbows. He did not undress for full skin exam  Assessment/Plan:  1. Annual physical exam  Lipid Panel   Comprehensive Metabolic Panel   CBC   Lipid Panel   Comprehensive Metabolic Panel   CBC    2. Mild intermittent asthma without complication (*)  albuterol  sulfate HFA  (PROVENTIL ,VENTOLIN ,PROAIR ) 108 (90 Base) MCG/ACT inhaler   albuterol  (ACCUNEB ) 1.25 MG/3ML nebulizer solution    3. Tobacco abuse      4. Witnessed episode of apnea  Ambulatory referral to Sleep Studies    5. Psoriasis      6. Spasm of abdominal muscles        Chronic illnesses being managed and being addressed today:  Assessment & Plan Asthma/tobacco use  - He uses an albuterol  inhaler and neb as needed.  - He does not follow with pulmonology.  - He used his inhaler this morning due to wheezing.  No shortness of breath today.   - He does continue to smoke.  He has smoked 1 pack/day since he was 38 years old.  He is not willing to quit at this time.  - Refill of inhaler and nebulizer provided today.  If worsening we will refer to pulmonology.  No wheezing on examination today.  SpO2 96%.  Witnessed apnea - referred for sleep studies -  Psoriasis  -Psoriasis is much improved since starting topical triamcinolone  cream  - mild scaliness present on bilateral elbows today. -Continue current treatment  Abdominal muscle spasms -No recurrence of symptoms since her last visit - He has not required Flexeril .  Vaccinations Declines Tdap, reports  completed <1 year ago. Requested records. Remainder of vaccinations UTD.  Screening: Colonoscopy: not indicated STD: Declines screening  PSA: declined screening  Eye Exams: UTD Dermatology: declined referral. Did not undress today  Cardiovascular Risk Screening  Lipid panel: Non- Fasting panel today Blood pressure: Looks great today Diabetes: Nonfasting glucose today. Reflex to A1c if necessary  Smoking status: he continues to smoke  Healthy lifestyle recommendations discussed with patient, including healthy diet, exercise, sunscreen use, and reduction of stress. Recommended dental exams every six months, and annual eye and skin exams. Based on current guidelines and patient's age, advised annual flu immunization, COVID immunization,  STD screening if applicable, prostate cancer screening at 55-69 if agreeable, colonoscopy at the age of 79-75 (unless high risk is identified), Shingrix at 54 and pneumonia vaccine as recommended. The USPSTF recommends annual screening for lung cancer with low-dose computed tomography (LDCT) in adults aged 49 to 85 years who have a 20 pack-year smoking history and currently smoke or have quit within the past 15 years. Screening should be discontinued once a person has not smoked for 15 years or develops a health problem that substantially limits life expectancy or the ability or willingness to have curative lung surgery. The USPSTF recommends 1-time screening for abdominal aortic aneurysm (AAA) with ultrasonography in men aged 49 to 75 years who have ever smoked.  Follow-up in 1 year for Annual Physical   Patient Instructions  Refills of albuterol  and nebulizer sent today  They will call to schedule your sleep studies for potential sleep apnea Labs ordered today, we will notify you with further recommendations once reviewed.  If you can find records of your most recent tetanus vaccination please call the office.    Wellness/Annual Physical Visit:   You had a physical done today. It is good to have one done yearly to prevent illness and disease with routine screenings and vaccines. Consider the following to improve or maintain your current health:   Partake in a healthy diet. Eating a Mediterranean diet is one of the healthiest diets. Focus on reducing or stopping any sugary drinks, like sodas, and reduce the amount of times you eat out in restaurants. Reduce packaged and processed foods as these can have hidden sugars and salts. Obtain 150 minutes of exercise weekly. Find an exercise routine that you can keep up with weekly (walking, swimming, body weight exercises).  Find ways to reduce stress (visit with family/friends regularly, take walks outside, journal, ask for help). Go to bed and wake up  at the same time each day. See the dentist every 6 months.  Wear sunscreen. Colon cancer screening performed between ages 62-75. Prostate cancer screening is completed yearly, through a blood test called PSA, starting at age 84. Obtain your Flu and COVID immunizations yearly, or as recommended. Shingrix at age 63 (shingles vaccine can be done at your pharmacy). Pneumococcal vaccine when 57 years of age or older, or if you are at increased risk, to prevent pneumonia.  Yearly low-dose CT scan for lung cancer screening if you are 22-11 years old and have been smoking for 20 years or more, or have quit in the last 15 years.  A one time ultrasound of your abdominal aorta to screen for aneurysm will be completed in men 83 years old or older who have ever smoked.     I have reviewed the patient's medical history in detail and updated the computerized patient record.  Discussed with patient treatment plan and appropriate follow up recommendations.  I have identified no barriers to treatment. I have answered questions and advised the patient to call me if any questions or concerns arise.   Portions of this note may have been entered using voice recognition software. Minor syntax, contextual, and spelling errors may be related to the use of this software and were not intentional. If corrections are necessary, please contact provider.     Patient's Medications       * Accurate as of January 30, 2024  4:27 PM. Reflects encounter med changes as of last refresh          Continued Medications      Instructions  * albuterol  sulfate HFA 108 (90 Base) MCG/ACT inhaler Commonly known as: PROVENTIL ,VENTOLIN ,PROAIR   2 puffs, Inhalation, Every 6 hours as needed   * albuterol  1.25 MG/3ML nebulizer solution Commonly known as: ACCUNEB   1.25 mg, Nebulization, Every 6 hours as needed   triamcinolone  acetonide 0.1% cream Commonly known as: KENalog   Topical, 2 times a day      * * This list has 2  medication(s) that are the same as other medications prescribed for you. Read the directions carefully, and ask your doctor or other care provider to review them with you.            Orders Placed This Encounter  Procedures   Lipid Panel   Comprehensive Metabolic Panel   CBC   Ambulatory referral to Sleep Studies       Computer technology was used to create visit note.  Consent from the patient/caregiver was obtained prior to its use. I have reviewed the information contained in this note and personally verified its accuracy.  MDM billing - I personally developed the plan of care based on documented medical decision making. Nestora GORMAN Louder, PA-C 01/30/2024  [facility]       [1] Current Outpatient Medications on File Prior to Visit  Medication Sig Dispense Refill   triamcinolone  acetonide (KENALOG ) 0.1% cream Apply topically 2 (two) times daily. 30 g 1   No current facility-administered medications on file prior to visit.  [2] No Known Allergies [3] Family History Problem Relation Name Age of Onset   Colon cancer Maternal Grandmother     No Known Problems Father      Mother    [4] Social History Socioeconomic History   Marital status: Married  Tobacco Use   Smoking status: Every Day    Current packs/day: 1.00    Average packs/day: 1 pack/day for 20.2 years (20.2 ttl pk-yrs)    Types: Cigarettes    Start date: 10/31/2003   Smokeless tobacco: Never  Vaping Use   Vaping status: Never Used  Substance and Sexual Activity   Alcohol use: Yes    Alcohol/week: 0.0 - 1.0 standard drinks of alcohol  *Some images could not be shown.

## 2024-03-03 ENCOUNTER — Encounter (HOSPITAL_BASED_OUTPATIENT_CLINIC_OR_DEPARTMENT_OTHER): Payer: Self-pay

## 2024-03-03 ENCOUNTER — Other Ambulatory Visit: Payer: Self-pay

## 2024-03-03 ENCOUNTER — Emergency Department (HOSPITAL_BASED_OUTPATIENT_CLINIC_OR_DEPARTMENT_OTHER)
Admission: EM | Admit: 2024-03-03 | Discharge: 2024-03-03 | Disposition: A | Discharge status: Home / Self Care | Payer: Self-pay | Attending: Emergency Medicine | Admitting: Emergency Medicine

## 2024-03-03 DIAGNOSIS — R6889 Other general symptoms and signs: Secondary | ICD-10-CM

## 2024-03-03 DIAGNOSIS — J45909 Unspecified asthma, uncomplicated: Secondary | ICD-10-CM | POA: Insufficient documentation

## 2024-03-03 DIAGNOSIS — J101 Influenza due to other identified influenza virus with other respiratory manifestations: Secondary | ICD-10-CM | POA: Insufficient documentation

## 2024-03-03 LAB — RESP PANEL BY RT-PCR (RSV, FLU A&B, COVID)  RVPGX2
Influenza A by PCR: NEGATIVE
Influenza B by PCR: POSITIVE — AB
Resp Syncytial Virus by PCR: NEGATIVE
SARS Coronavirus 2 by RT PCR: NEGATIVE

## 2024-03-03 MED ORDER — BENZONATATE 100 MG PO CAPS: 100.0000 mg | ORAL_CAPSULE | Freq: Three times a day (TID) | ORAL | 0 refills | Status: AC

## 2024-03-03 MED ORDER — KETOROLAC TROMETHAMINE 15 MG/ML IJ SOLN
15.0000 mg | Freq: Once | INTRAMUSCULAR | Status: AC
  Administered 2024-03-03: 15 mg via INTRAMUSCULAR
  Filled 2024-03-03: qty 1

## 2024-03-03 NOTE — ED Triage Notes (Signed)
 Pt presents w/ bodyaches, sore throat, headache, dry cough since yesterday. Reports family members have flu. Hx asthma.

## 2024-03-03 NOTE — ED Notes (Signed)
 DC paperwork given and verbally understood.

## 2024-03-03 NOTE — Discharge Instructions (Addendum)
 You can take 1000 mg of Tylenol  every 8 hours.  You can take ibuprofen  600 mg every 8 hours with food.   Make sure you are staying well-hydrated with primarily water  and alternate Pedialyte or Gatorade.  Take Tessalon  Perles for cough, but do not give to children.  Return to the emergency room with new or worsening symptoms.

## 2024-03-03 NOTE — ED Provider Notes (Signed)
 " [place] EM[facility]GENCY DEPARTMENT AT [facility] Provider Note   CSN: [id number] Arrival date & time: 03/03/24  9087     Patient presents with: No chief complaint on file.   Nicholas Brooks is a 39 y.o. male.  Patient with past medical history of asthma comes into the emergency room with complaint of congestion, cough, body aches, subjective fever and feeling sick.  Patient reports that over the last few days several family members have been sick and diagnosed with the flu.  He denies any chest pain, shortness of breath.    HPI     Prior to Admission medications  Medication Sig Start Date End Date Taking? Authorizing Provider  albuterol  (PROVENTIL  HFA) 108 (90 Base) MCG/ACT inhaler INHALE ONE TO TWO PUFFS INTO LUNGS EVERY 4 HOURS AS NEEDED FOR WHEEZING, SHORTNESS OF BREATH OR COUGH 08/29/17   Baxter Drivers, MD  albuterol  (PROVENTIL ) (2.5 MG/3ML) 0.083% nebulizer solution USE ONE VIAL IN NEBULIZER EVERY 4 HOURS AS NEEDED FOR WHEEZING 09/23/23   Henderly, Britni A, PA-C  albuterol  (PROVENTIL ) (5 MG/ML) 0.5% nebulizer solution Take 0.5 mLs (2.5 mg total) by nebulization every 6 (six) hours as needed for wheezing or shortness of breath. 09/23/23   Henderly, Britni A, PA-C    Allergies: Patient has no known allergies.    Review of Systems  HENT:  Positive for congestion.     Updated Vital Signs BP (!) 146/103   Pulse 93   Temp 98.5 F (36.9 C) (Oral)   Resp 20   SpO2 98%   Physical Exam Vitals and nursing note reviewed.  Constitutional:      General: He is not in acute distress.    Appearance: He is ill-appearing. He is not toxic-appearing.  HENT:     Head: Normocephalic and atraumatic.     Nose: Congestion and rhinorrhea present.     Mouth/Throat:     Pharynx: Posterior oropharyngeal erythema present. No oropharyngeal exudate.  Eyes:     General: No scleral icterus.    Conjunctiva/sclera: Conjunctivae normal.  Cardiovascular:     Rate and Rhythm: Normal rate and  regular rhythm.     Pulses: Normal pulses.     Heart sounds: Normal heart sounds.  Pulmonary:     Effort: Pulmonary effort is normal. No respiratory distress.     Breath sounds: Normal breath sounds. No wheezing.     Comments: Lungs are clear to auscultation, no wheezing.  No hypoxia. Abdominal:     General: Abdomen is flat. Bowel sounds are normal.     Palpations: Abdomen is soft.     Tenderness: There is no abdominal tenderness.  Skin:    General: Skin is warm and dry.     Findings: No lesion.  Neurological:     General: No focal deficit present.     Mental Status: He is alert and oriented to person, place, and time. Mental status is at baseline.     (all labs ordered are listed, but only abnormal results are displayed) Labs Reviewed  RESP PANEL BY RT-PCR (RSV, FLU A&B, COVID)  [id number]    EKG: None  Radiology: No results found.   Procedures   Medications Ordered in the ED  ketorolac  (TORADOL ) 15 MG/ML injection 15 mg (has no administration in time range)  Medical Decision Making Risk Prescription drug management.   This patient presents to the ED for concern of flu like symptoms, this involves an extensive number of treatment options, and is a complaint that carries with it a high risk of complications and morbidity.  The differential diagnosis includes pneumonia, viral URI with cough, otitis media, otitis externa, strep throat, mono, other viral pharyngitis, peritonsillar abscess   Lab Tests:  I personally interpreted labs.  The pertinent results include:   Resp panel pending, patient will check on MyChart   Problem List / ED Course / Critical interventions / Medication management  Patient presents emergency room with flulike symptoms.  He has no sign of acute otitis media on exam.  He denies any sore throat.  He denies chest pain or shortness of breath.  His lungs are clear to auscultation bilaterally.  His vital signs are  stable.  He is not hypoxic.  He is nontoxic-appearing.  Given duration I do not feel he needs chest x-ray at this time.  He has multiple sick contacts with the flu.  I do suspect viral illness is most likely cause of patient symptoms at this time. I ordered medication including Toradol  Reevaluation of the patient after these medicines showed that the patient improved I have reviewed the patients home medicines and have made adjustments as needed Discussed Tamiflu , patient reports he already has this medication at home and will take if flu test comes back positive.  Patient hemodynamically stable and well-appearing throughout stay.  Feel appropriate for discharge with outpatient follow-up.  Discussed symptom management and return precautions.         Final diagnoses:  Flu-like symptoms    ED Discharge Orders          Ordered    benzonatate  (TESSALON ) 100 MG capsule  Every 8 hours        03/03/24 0942               Voyd Groft, Warren SAILOR, PA-C 03/03/24 1034    Jerrol Agent, MD 03/03/24 1119  "
